# Patient Record
Sex: Female | Born: 1935 | Race: White | Hispanic: No | Smoking: Never smoker
Health system: Southern US, Community
[De-identification: ages and names within clinical notes are randomized; demographics above are authoritative.]

## PROBLEM LIST (undated history)

## (undated) DIAGNOSIS — R55 Syncope and collapse: Secondary | ICD-10-CM

## (undated) DIAGNOSIS — I10 Essential (primary) hypertension: Secondary | ICD-10-CM

## (undated) DIAGNOSIS — J189 Pneumonia, unspecified organism: Secondary | ICD-10-CM

## (undated) DIAGNOSIS — Z79899 Other long term (current) drug therapy: Secondary | ICD-10-CM

## (undated) DIAGNOSIS — J449 Chronic obstructive pulmonary disease, unspecified: Secondary | ICD-10-CM

## (undated) DIAGNOSIS — I4891 Unspecified atrial fibrillation: Secondary | ICD-10-CM

## (undated) DIAGNOSIS — Z9981 Dependence on supplemental oxygen: Secondary | ICD-10-CM

## (undated) DIAGNOSIS — M1990 Unspecified osteoarthritis, unspecified site: Secondary | ICD-10-CM

## (undated) HISTORY — PX: CATARACT EXTRACTION, BILATERAL: SHX1313

## (undated) HISTORY — DX: Unspecified atrial fibrillation: I48.91

## (undated) HISTORY — DX: Essential (primary) hypertension: I10

## (undated) HISTORY — PX: HIP FRACTURE SURGERY: SHX118

## (undated) HISTORY — DX: Pneumonia, unspecified organism: J18.9

## (undated) HISTORY — DX: Syncope and collapse: R55

## (undated) HISTORY — PX: FEMUR FRACTURE SURGERY: SHX633

## (undated) HISTORY — DX: Other long term (current) drug therapy: Z79.899

## (undated) HISTORY — PX: FRACTURE SURGERY: SHX138

## (undated) HISTORY — PX: TONSILLECTOMY: SUR1361

---

## 1975-01-05 ENCOUNTER — Encounter: Payer: Self-pay | Admitting: Cardiology

## 1998-02-24 ENCOUNTER — Encounter: Payer: Self-pay | Admitting: Internal Medicine

## 1998-02-24 ENCOUNTER — Emergency Department (HOSPITAL_COMMUNITY): Admission: EM | Admit: 1998-02-24 | Discharge: 1998-02-24 | Payer: Self-pay | Admitting: Internal Medicine

## 1998-07-12 ENCOUNTER — Other Ambulatory Visit: Admission: RE | Admit: 1998-07-12 | Discharge: 1998-07-12 | Payer: Self-pay | Admitting: Family Medicine

## 1998-09-23 ENCOUNTER — Encounter: Payer: Self-pay | Admitting: Emergency Medicine

## 1998-09-24 ENCOUNTER — Encounter: Payer: Self-pay | Admitting: Orthopedic Surgery

## 1998-09-24 ENCOUNTER — Inpatient Hospital Stay (HOSPITAL_COMMUNITY): Admission: EM | Admit: 1998-09-24 | Discharge: 1998-09-30 | Payer: Self-pay | Admitting: Emergency Medicine

## 1998-09-30 ENCOUNTER — Inpatient Hospital Stay (HOSPITAL_COMMUNITY)
Admission: RE | Admit: 1998-09-30 | Discharge: 1998-10-07 | Payer: Self-pay | Admitting: Physical Medicine & Rehabilitation

## 1998-10-01 ENCOUNTER — Encounter: Payer: Self-pay | Admitting: Physical Medicine & Rehabilitation

## 1999-10-16 ENCOUNTER — Encounter: Payer: Self-pay | Admitting: Family Medicine

## 1999-10-16 ENCOUNTER — Encounter: Admission: RE | Admit: 1999-10-16 | Discharge: 1999-10-16 | Payer: Self-pay | Admitting: Family Medicine

## 2000-11-09 ENCOUNTER — Encounter: Admission: RE | Admit: 2000-11-09 | Discharge: 2000-11-09 | Payer: Self-pay | Admitting: Family Medicine

## 2000-11-09 ENCOUNTER — Encounter: Payer: Self-pay | Admitting: Family Medicine

## 2001-12-14 ENCOUNTER — Encounter: Payer: Self-pay | Admitting: Family Medicine

## 2001-12-14 ENCOUNTER — Encounter: Admission: RE | Admit: 2001-12-14 | Discharge: 2001-12-14 | Payer: Self-pay | Admitting: Family Medicine

## 2002-12-04 ENCOUNTER — Encounter: Payer: Self-pay | Admitting: Family Medicine

## 2002-12-04 ENCOUNTER — Encounter: Admission: RE | Admit: 2002-12-04 | Discharge: 2002-12-04 | Payer: Self-pay | Admitting: Family Medicine

## 2003-01-10 ENCOUNTER — Encounter: Payer: Self-pay | Admitting: Family Medicine

## 2003-01-10 ENCOUNTER — Encounter: Admission: RE | Admit: 2003-01-10 | Discharge: 2003-01-10 | Payer: Self-pay | Admitting: Family Medicine

## 2003-02-12 ENCOUNTER — Encounter: Payer: Self-pay | Admitting: Gastroenterology

## 2003-02-12 ENCOUNTER — Encounter: Admission: RE | Admit: 2003-02-12 | Discharge: 2003-02-12 | Payer: Self-pay | Admitting: Gastroenterology

## 2003-04-06 ENCOUNTER — Encounter (INDEPENDENT_AMBULATORY_CARE_PROVIDER_SITE_OTHER): Payer: Self-pay | Admitting: Cardiology

## 2003-04-06 ENCOUNTER — Inpatient Hospital Stay (HOSPITAL_COMMUNITY): Admission: EM | Admit: 2003-04-06 | Discharge: 2003-04-11 | Payer: Self-pay | Admitting: Emergency Medicine

## 2003-08-30 ENCOUNTER — Ambulatory Visit (HOSPITAL_COMMUNITY): Admission: RE | Admit: 2003-08-30 | Discharge: 2003-08-30 | Payer: Self-pay | Admitting: Internal Medicine

## 2005-02-12 ENCOUNTER — Encounter (INDEPENDENT_AMBULATORY_CARE_PROVIDER_SITE_OTHER): Payer: Self-pay | Admitting: *Deleted

## 2005-02-12 ENCOUNTER — Ambulatory Visit (HOSPITAL_COMMUNITY): Admission: RE | Admit: 2005-02-12 | Discharge: 2005-02-12 | Payer: Self-pay | Admitting: Internal Medicine

## 2007-07-17 HISTORY — PX: KNEE ARTHROCENTESIS: SUR44

## 2007-08-07 ENCOUNTER — Inpatient Hospital Stay (HOSPITAL_COMMUNITY): Admission: EM | Admit: 2007-08-07 | Discharge: 2007-08-15 | Payer: Self-pay | Admitting: Emergency Medicine

## 2007-08-07 ENCOUNTER — Ambulatory Visit: Payer: Self-pay | Admitting: Cardiology

## 2007-08-08 ENCOUNTER — Ambulatory Visit: Payer: Self-pay | Admitting: Vascular Surgery

## 2007-08-08 ENCOUNTER — Encounter (INDEPENDENT_AMBULATORY_CARE_PROVIDER_SITE_OTHER): Payer: Self-pay | Admitting: Internal Medicine

## 2007-08-15 ENCOUNTER — Ambulatory Visit: Payer: Self-pay | Admitting: Physical Medicine & Rehabilitation

## 2007-08-31 ENCOUNTER — Ambulatory Visit: Payer: Self-pay | Admitting: Cardiology

## 2007-09-08 ENCOUNTER — Ambulatory Visit: Payer: Self-pay

## 2007-09-15 ENCOUNTER — Ambulatory Visit: Payer: Self-pay

## 2007-10-12 ENCOUNTER — Ambulatory Visit: Payer: Self-pay | Admitting: Cardiology

## 2008-11-22 ENCOUNTER — Encounter (INDEPENDENT_AMBULATORY_CARE_PROVIDER_SITE_OTHER): Payer: Self-pay | Admitting: *Deleted

## 2009-03-25 DIAGNOSIS — I4891 Unspecified atrial fibrillation: Secondary | ICD-10-CM | POA: Insufficient documentation

## 2009-03-25 DIAGNOSIS — R55 Syncope and collapse: Secondary | ICD-10-CM | POA: Insufficient documentation

## 2009-03-25 DIAGNOSIS — I10 Essential (primary) hypertension: Secondary | ICD-10-CM

## 2009-03-27 ENCOUNTER — Ambulatory Visit: Payer: Self-pay | Admitting: Cardiology

## 2009-03-27 DIAGNOSIS — Q211 Atrial septal defect: Secondary | ICD-10-CM

## 2009-04-01 LAB — CONVERTED CEMR LAB
BUN: 26 mg/dL — ABNORMAL HIGH (ref 6–23)
Basophils Absolute: 0 10*3/uL (ref 0.0–0.1)
Basophils Relative: 0.5 % (ref 0.0–3.0)
CO2: 31 meq/L (ref 19–32)
Calcium: 9.1 mg/dL (ref 8.4–10.5)
Chloride: 105 meq/L (ref 96–112)
Creatinine, Ser: 1.3 mg/dL — ABNORMAL HIGH (ref 0.4–1.2)
Eosinophils Absolute: 0.1 10*3/uL (ref 0.0–0.7)
Eosinophils Relative: 1.5 % (ref 0.0–5.0)
GFR calc non Af Amer: 41.26 mL/min (ref >60)
Glucose, Bld: 91 mg/dL (ref 70–99)
HCT: 39 % (ref 36.0–46.0)
Hemoglobin: 13.1 g/dL (ref 12.0–15.0)
Lymphocytes Relative: 25.7 % (ref 12.0–46.0)
Lymphs Abs: 1.9 10*3/uL (ref 0.7–4.0)
MCHC: 33.6 g/dL (ref 30.0–36.0)
MCV: 97.7 fL (ref 78.0–100.0)
Monocytes Absolute: 0.7 10*3/uL (ref 0.1–1.0)
Monocytes Relative: 9.4 % (ref 3.0–12.0)
Neutro Abs: 4.6 10*3/uL (ref 1.4–7.7)
Neutrophils Relative %: 62.9 % (ref 43.0–77.0)
Platelets: 156 10*3/uL (ref 150.0–400.0)
Potassium: 4.2 meq/L (ref 3.5–5.1)
RBC: 3.99 M/uL (ref 3.87–5.11)
RDW: 14.5 % (ref 11.5–14.6)
Sodium: 143 meq/L (ref 135–145)
WBC: 7.3 10*3/uL (ref 4.5–10.5)

## 2010-05-07 ENCOUNTER — Ambulatory Visit: Payer: Self-pay | Admitting: Cardiology

## 2010-05-07 ENCOUNTER — Encounter: Payer: Self-pay | Admitting: Cardiology

## 2010-06-07 ENCOUNTER — Encounter: Payer: Self-pay | Admitting: Family Medicine

## 2010-06-19 NOTE — Assessment & Plan Note (Signed)
Summary: [facility]   Visit Type:  1 year follow up  CC:  No complaints.  History of Present Illness: Candice Rangel is a pleasant  female whom I initially saw on August 31, 2007.  She had been admitted to [facility] on August 07, 2007 with a syncopal episode.  Of note, a previous echocardiogram in March of 2009 revealed normal LV function, mild mitral and tricuspid regurgitation and moderate biatrial enlargement. There was a large PFO.  We did schedule her to have a Myoview which was performed on September 15, 2007. There was no ischemia or infarction, and her ejection fraction was 78%. She also had a CardioNet monitor.  There was atrial fibrillation noted which was chronic, but there were no significant pauses. I last saw her in Nov 2010. Since then the patient denies any dyspnea on exertion, orthopnea, PND,  palpitations, syncope or chest pain; occasional mild pedal edema. No bleeding.   Current Medications (verified): 1)  Losartan Potassium 50 Mg Tabs (Losartan Potassium) .Marland Kitchen.. 1 Tab By Mouth Once Daily 2)  Diltiazem Hcl Er Beads 240 Mg Xr24h-Cap (Diltiazem Hcl Er Beads) .... Take One Capsule By Mouth Daily 3)  Warfarin Sodium 2.5 Mg Tabs (Warfarin Sodium) .... As Directed 4)  Digoxin 0.125 Mg Tabs (Digoxin) .... Take A Half  Tablet By Mouth Daily 5)  Zyrtec Allergy 10 Mg Tabs (Cetirizine Hcl) .... As Needed 6)  Multivitamins   Tabs (Multiple Vitamin) .Marland Kitchen.. 1 Tab By Mouth Once Daily 7)  Calcium-Vitamin D 500-200 Mg-Unit Tabs (Calcium Carbonate-Vitamin D) .Marland Kitchen.. 1 Tab By Mouth Once Daily  Allergies (verified): 1)  ! Penicillin 2)  ! Keflex  Past History:  Past Medical History: HYPERTENSION (ICD-401.9) ATRIAL FIBRILLATION (ICD-427.31) SYNCOPE (ICD-780.2) COUMADIN THERAPY (ICD-V58.61) History of pneumonia/bronchiectasis.   Past Surgical History: Left knee aspiration and injection.   tonsillectomy,  C-section,   surgical procedure on her leg following a trauma.    Cataract surgery bilaterally.   Right hip fracture status post repair.   Social History: Reviewed history from 03/27/2009 and no changes required.  The patient is married.    She  denies any history use of alcohol or smoking.   She is the coowner of a restaurant.   Review of Systems       no fevers or chills, productive cough, hemoptysis, dysphasia, odynophagia, melena, hematochezia, dysuria, hematuria, rash, seizure activity, orthopnea, PND, pedal edema, claudication. Remaining systems are negative.   Vital Signs:  Patient profile:   77 year old female Height:      63 inches Weight:      112.75 pounds BMI:     20.04 Pulse rate:   76 / minute Pulse rhythm:   irregular Resp:     18 per minute BP sitting:   152 / 79  (right arm) Cuff size:   regular  Vitals Entered By: Candice Rangel (May 07, 2010 12:28 PM)  Physical Exam  General:  Well-developed well-nourished in no acute distress.  Skin is warm and dry.  HEENT is normal.  Neck is supple. No thyromegaly.  Chest is clear to auscultation with normal expansion.  Cardiovascular exam is irregular Abdominal exam nontender or distended. No masses palpated. Extremities show 1+ ankle edema. neuro grossly intact    EKG  Procedure date:  05/07/2010  Findings:      atrial fibrillation with PVCs vs nonconducted beats. Cannot rule out prior septal infarct. Nonspecific ST changes.  Impression & Recommendations:  Problem # 1:  ATRIAL  FIBRILLATION (ICD-427.31) Pt remains asymptomatic. Continue Cardizem and digoxin for rate control. Continue Coumadin. Her updated medication list for this problem includes:    Warfarin Sodium 2.5 Mg Tabs (Warfarin sodium) .Marland Kitchen... As directed    Digoxin 0.125 Mg Tabs (Digoxin) .Marland Kitchen... Take a half  tablet by mouth daily  Her updated medication list for this problem includes:    Warfarin Sodium 2.5 Mg Tabs (Warfarin sodium) .Marland Kitchen... As directed    Digoxin 0.125 Mg Tabs (Digoxin) .Marland Kitchen... Take a half   tablet by mouth daily  Orders: EKG w/ Interpretation (93000)  Problem # 2:  HYPERTENSION (ICD-401.9) Blood pressure mildly elevated. I am hesitant to increase her medications as she has some dizziness with standing. Check potassium and renal function. Her updated medication list for this problem includes:    Losartan Potassium 50 Mg Tabs (Losartan potassium) .Marland Kitchen... 1 tab by mouth once daily    Diltiazem Hcl Er Beads 240 Mg Xr24h-cap (Diltiazem hcl er beads) .Marland Kitchen... Take one capsule by mouth daily  Problem # 3:  PATENT FORAMEN OVALE (ICD-745.5) Conservative measures giving age.  Problem # 4:  COUMADIN THERAPY (ICD-V58.61) Managed by primary care. Goal INR 2-3. he is  Patient Instructions: 1)  Your physician recommends that you schedule a follow-up appointment ; YEAR WITH DR Jens Som 2)  Your physician recommends that you continue on your current medications as directed. Please refer to the Current Medication list given to you today. 3)  Your physician recommends that you return for lab work [id number] BMET CBC 401.1 V58.69

## 2010-09-30 NOTE — H&P (Signed)
Candice Rangel, GIBEAULT NO.:  [id number]   MEDICAL RECORD NO.:  [id number]          PATIENT TYPE:  INP   LOCATION:  1826                         FACILITY:  [facility]   PHYSICIAN:  Hillery Aldo, M.D.   DATE OF BIRTH:  [birth year]   DATE OF ADMISSION:  18-May-202009  DATE OF DISCHARGE:                              HISTORY & PHYSICAL   PRIMARY CARE PHYSICIAN:  Dr. Andrey Campanile with [facility].   CHIEF COMPLAINT:  Fall secondary to syncope, seizure.   HISTORY OF PRESENT ILLNESS:  The patient is an 77 year old remarkably  active female who had a syncopal event while working today.  She does  not recall the events prior to her syncopal episode and her first memory  after the event was waking up in the hospital.  Currently complaining of  a mild headache at the site where she struck her head, sustaining a  minor head injury.  She denies any chest pain but has some right-sided  shoulder pain.  She denies shortness of breath but has had a cough  productive of yellow sputum.  No fever or chills but she was recently  diagnosed with pneumonia approximately 2 days ago and put on Levaquin.  She has a history of bronchiectasis and recurrent pneumonias.  The  syncopal event was witnessed by her daughter who works with her in a  restaurant.  The patient's daughter states she heard the patient fall to  the floor and noticed seizure activity for approximately 5 minutes.  She  immediately called 9-1-1 and the patient slowly regained consciousness.  She initially opened her eyes only and was nonverbal.  At this point,  the patient is alert and somewhat oriented and appropriate in  conversation.  She is being admitted for further evaluation and workup.   PAST MEDICAL HISTORY:  1. Coronary artery disease.  2. Hypertension.  3. History of recurrent pneumonia.  4. Chronic atrial fibrillation on Coumadin therapy.  5. Allergic rhinitis.  6. Bronchiectasis with chronic interstitial  lung disease.  7. Mild multinodular goiter by CT scanning.   PAST SURGICAL HISTORY:  1. Cataract surgery bilaterally.  2. Right hip fracture status post repair.  3. Tonsillectomy.  4. Cesarean section.   FAMILY HISTORY:  The patient's mother died at 25 from acute MI.  She  also had hypertension.  The patient's father died at 53 from  complications of influenza.  She has three sisters and two brothers.  All of her siblings are deceased.  One sister died from complications of  Alzheimer's disease, one died of ovarian cancer, one died of an MI, and  one's cause of death was uncertain.  One brother died of Doreatha Martin  disease and another died of uncertain causes.   SOCIAL HISTORY:  The patient is married x66 years.  She has a remote  history of tobacco but quit over 50 years ago.  She denies alcohol use.  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   ALLERGIES:  1. PENICILLIN.  2. SEPTRA.  MEDICATIONS:  1. Levaquin 750 mg daily (initiated on August 05, 2007).  2. Cyclobenzaprine 10 mg one-half to one tablet b.i.d. (has not taken      in some time).  3. Coumadin, dosage uncertain.  4. Blood pressure medication, uncertain name or type.  5. Ultram 50 mg q.8h. p.r.n.  6. Zyrtec 10 mg daily.  7. Benzonatate 100 mg one to two tablets q.8h. p.r.n.   REVIEW OF SYSTEMS:  The patient reports a gradual decrease in her  appetite over time and an approximate 10-pound weight loss over many  months to years.  She ate a heavy meal last night and did get up several  times during night complaining of loose stools x3.  She denies any  melena or hematochezia.  She denies nausea or vomiting.  She did have  some dyspepsia yesterday.  She notes that on her most recent visit to  the doctor she was complaining of an irregular and racing heart.   PHYSICAL EXAMINATION:  Temperature 96.3, pulse 76, respirations 16,  blood pressure 131/77,  O2 saturation 94% on room air.  GENERAL:  This is a well-developed, well-nourished female who appears  younger than her stated age.  HEENT:  Normocephalic, atraumatic except for a periorbital hematoma  above the left eye that was repaired skin glue.  PERRL.  EOMI.  Oropharynx is clear.  No tongue deviation.  She does have a right tongue  abrasion.  NECK:  Supple.  No thyromegaly, no lymphadenopathy, no jugular venous  distention.  CHEST:  Diminished breath sounds with occasional crackles bilaterally.  HEART:  Heart sounds are irregularly irregular.  Variable rate.  ABDOMEN:  Soft, nontender, nondistended with normoactive bowel sounds.  EXTREMITIES:  No clubbing, edema, or cyanosis.  No calf tenderness.  SKIN:  Warm and dry.  No rashes.  NEUROLOGIC:  The patient is alert and oriented to self, place, month,  and year.  She is 1 day off on the day of the week and 1 day off with  regard to the specific date.  Cranial nerves II-XII are grossly intact.  She moves all extremities x4 with equal strength.   DATA REVIEW:  CT scan of the head and cervical spine are negative for  acute changes.   Laboratory data:  Sodium is 140, potassium 4.2, chloride 108, bicarb  23.2, BUN 22, glucose 134.  Point-of-care cardiac markers negative x1.  Urinalysis is positive for leukocytes, negative for nitrites.  The  microscopy shows 0-2 white cells, 3-6 red cells, rare bacteria, and 100  mg/dL of protein.  There were many squamous cells present in the  specimen indicating contamination.   ASSESSMENT AND PLAN:  1. Syncope with seizure:  I suspect the patient has tachybrady      syndrome and this is largely a cardiac-induced event.  We will      admit her and monitor on telemetry.  Will obtain a cardiology      consult in the morning.  We will cycle cardiac enzymes q.8h. x3 to      rule out acute MI.  Check a two-dimensional echocardiogram.  For      completeness, would also check an MRI/MRA and carotid  Dopplers to      rule out cerebrovascular disease.  She is already on Coumadin and I      will continue this per pharmacy dosing.  Will put her on seizure      precautions.  2. History of hypertension:  Clarify the  patient's outpatient regimen.      She currently states that her blood pressure medicine is filled by      [employer] through a [employer].  Her daughter will call me      with her exact medication regimen when she gets home.      Nevertheless, the patient is not currently hypertensive.  3. Atrial fibrillation:  The patient has a variable rate.  She did      have a 12-lead EKG done in the emergency department which does show      atrial fibrillation and Q waves in the septal leads.  There are no      acute ST or T-wave changes appreciated.  Ventricular rate is 78      beats per minute but again, it is highly variable.  Unclear if the      patient is currently on digoxin so I will check a digoxin level.      Will check the patient's PT and INR to ensure that she is      therapeutic on her Coumadin.  4. Pneumonia:  Check the patient's chest x-ray and continue her      Levaquin.  5. Allergic rhinitis:  Continue Zyrtec.  6. Prophylaxis:  The patient is on Coumadin which should prevent DVT.      We will add Protonix for GI prophylaxis.      Hillery Aldo, M.D.  Electronically Signed     CR/MEDQ  D:  02-Mar-202009  T:  02-Mar-202009  Job:  [id number]   cc:   Gloriajean Dell. Andrey Campanile, M.D.

## 2010-09-30 NOTE — Discharge Summary (Signed)
NAMEMADY, OUBRE NO.:  [id number]   MEDICAL RECORD NO.:  [id number]          PATIENT TYPE:  INP   LOCATION:  6703                         FACILITY:  [facility]   PHYSICIAN:  Hettie Holstein, D.O.    DATE OF BIRTH:  07-26-1920   DATE OF ADMISSION:  Feb 01, 202009  DATE OF DISCHARGE:                               DISCHARGE SUMMARY   PRIMARY CARE PHYSICIAN:  Gloriajean Dell. Andrey Campanile, M.D., [facility].   FINAL DIAGNOSES:  1. Syncope.  After a thorough inpatient evaluation, this was felt      predominantly to be related to quinolone class of antibiotics as      much of her constellation of symptoms resolved after      discontinuation of these medications.  These symptoms included      multiple joint pains and swellings including her left knee, right      wrist, and right shoulder as well as intermittent bouts of      confusion and her presenting complaint of syncope which had no      reoccurrence during her hospital course.  Status post inpatient      consultation with neurology who did not recommend antiseizure      medications, but did feel that she should not be cleared to drive      for 6 months being seizure free or until followup and clearance      from neurology.  2. Chronic atrial fibrillation, on chronic Coumadin therapy.  3. Incidental finding of a patent foramen ovale on 2-D echocardiogram      performed on August 08, 2007.  For full details, please refer to the      echo report below.  4. Pneumonia with a history of bronchiectasis, status post completed      treatment course with 7 days of quinolone antibiotics.  5. Left knee effusion thought to be related to pseudogout, status post      joint aspiration with negative crystal.  Exam on August 13, 2006,      status post orthopedic consultation by Dr. Dion Saucier.   MEDICATIONS ON DISCHARGE:  These will be completed at actual time of  discharge; however, at the present time, she is on  1. Colchicine 0.6 mg  p.o. b.i.d.  2. Digoxin 0.125 mg daily.  3. Tiazac 240 mg daily.  4. Mucinex 600 mg p.o. b.i.d.  This is anticipated to continue for      another week.  5. Motrin provided 300 mg p.o. b.i.d.  This can be discontinued after      5 days.  6. Zyrtec 10 mg daily.  7. Her Cozaar is being held at this time due to marginal blood      pressure.  8. Senokot twice daily with parameters to hold if she develop      diarrhea.  9. Coumadin per pharmacy protocol.  At present, her INR is 2.4.      Again, please note further addendum to this medication list will be      conducted at actual time of patient transfer or discharge.  STUDIES PERFORMED:  1. She has had multiple imaging studies.  Her initial CT of her C-      spine was performed in the emergency department due a fall which      revealed normal alignment with degenerative disk disease primarily      at C4-C5, C5-C6, and C6-C7.  No acute fractures or degenerative      changes in C1 and C2 articulation.  2. Her initial CT of the head revealed only atrophy and small vessel      disease as well.  Her initial MRI, MRA to rule out stroke revealed      no acute intracranial abnormality.  There was some slightly      increased signal within the right hippocampus.  There were some      mention that this could possibly be related to the recent seizure      activities, no definite seizures with focus were identified,      advanced periventricular and subcortical white matter changes,      these were nonspecific, likely reflects sequelae of chronic      microvascular ischemia.  3. Prominent soft tissue posterior to the dens, with possible erosion,      this may represent a degree of inflammatory arthritis, perhaps      rheumatoid.  4. Left scalp laceration was noted without underlying fracture.  5. Her chest x-ray revealed only trace bilateral pleural effusions and      a pulmonary parenchymal findings of MAC,confluent opacity in the      left mid  lung zone that revealed some questionable findings of a      nodule A follow up CT was performed on August 11, 2007, that      revealed no evidence of pulmonary emboli or thoracic aortic      dissection.  There was left basilar atelectasis/air space disease      and chronic peribronchial thickening and a benign right lower lung      nodule as reported by Dr. Si Gaul.  6. Other imaging studies included a CT of the head and a repeat MRI of      the brain as she had some transient confusion during her stay and      the concern for with a stroke were considered.  These both were      negative.  7. Her bilateral carotid duplex on August 08, 2007, revealed no      evidence of significant internal carotid artery stenosis.  8. Her 2-D echocardiogram revealed overall normal LV systolic      function, EF was estimated at 55%.  There was a mild mitral      regurgitation, and there was some moderate dilation of the left      atrium and the right atrium with a fairly large patent foramen      ovale.  Additional radiographs including 2 view of her knee      revealed no evidence of fracture or acute abnormity.  There was      chondrocalcinosis suggestive of calcium phosphate disease with      narrowing of the medial compartment, articular cartilage, and      osteopenia.  9. TSH was 2.24.  Cardiac markers were negative.  Digoxin level was      therapeutic.  D-dimer was mildly elevated at 0.96.   HISTORY OF PRESENT ILLNESS:  For full details, please refer to the H and  P as dictated by Dr.  Rama; however, briefly, Mrs. 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  Apparently, she had recently been treated for a pneumonia with  initiation of a quinolone antibiotic through her primary care physician.  She has a previous history of bronchiectasis and recurrent pneumonias.  In any event,  daughter states that she heard the patient fall to the  floor and noticed a seizure activity of approximately 5 minutes.  She  immediately called 911, and the patient slowly regained consciousness.  She then slowly opened her eyes only and was nonverbal.  At that point,  she was alert, somewhat oriented and appropriate to conversation.  She  is generally quite vibrant and active according her to daughter,  Synethia, as she works about 3 days a weeks in her bagel shop, called  [employer].  In any event, she was admitted for further workup.  She was see in consultation by Dr. Dietrich Pates.  She has a known history of  chronic atrial fibrillation, on chronic Coumadin therapy.  In addition,  she was seen by neurology, Dr. Porfirio Mylar Dohmeier.   HOSPITAL COURSE:  Her hospital course was that of further workup.  She  continued with the quinolone antibiotics and suffered in retrospect  symptoms that resolved after discontinuation of this medication.  These  included joint pains and effusions including left knee, right shoulder,  and right wrist as well as some intermittent periods of confusion.  Due  to the progressive pain in her left knee, orthopedic surgeon was  consulted and services were provided by Dr. Dion Saucier, and Mrs. Herrod  experienced prompt relief of her pain with initiation of anti-  inflammatories and a joint aspiration.  No crystals were noted.  Additionally, it was noted that she was placed on  colchicine.  In the event, she is quite deconditioned, and I suspect  that she is definitely intent on returning home and returning to work.  Certainly, I hope that she can achieve adequate physical therapy and  occupational therapy such that she may return home.  I have requested  assistance of PT, OT as well as inpatient rehabilitation.      Hettie Holstein, D.O.  Electronically Signed     ESS/MEDQ  D:  08/14/2007  T:  08/15/2007  Job:  [id number]   cc:   Gloriajean Dell. Andrey Campanile, M.D.

## 2010-09-30 NOTE — Assessment & Plan Note (Signed)
[facility]                            CARDIOLOGY OFFICE NOTE   NAME:VALLEYCortina, Vultaggio                       MRN:          [id number]  DATE:10/12/2007                            DOB:          [birth year]    Candice Rangel is a pleasant 77 year old female whom I initially saw on  August 31, 2007.  She had been admitted to [facility] on August 07, 2007 with a syncopal episode.  Please refer to my note for details.  Of note, a previous echocardiogram had shown normal LV function.  We did  schedule her to have a Myoview which was performed on September 15, 2007.  There was no ischemia or infarction, and her ejection fraction was 78%.  She also had a CardioNet monitor.  There was atrial fibrillation noted,  but there were no significant pauses.  Since I last saw her, she denies  any dyspnea, chest pain, palpitations, and there has been no recurrent  syncope.  She has mild pedal edema which is unchanged.   MEDICATIONS:  1. Digoxin 0.125 mg p.o. daily.  2. Cozaar 50 mg p.o. daily.  3. Coumadin as directed.  4. Cardizem 240 mg p.o. daily.  5. Zyrtec.   PHYSICAL EXAMINATION:  VITAL SIGNS:  Blood pressure of 134/60 and pulse  of 67.  She weighs 121 pounds.  HEENT:  Normal.  NECK:  Supple.  CHEST:  Clear.  CARDIOVASCULAR:  Irregular rhythm.  There is a 2/6 systolic murmur at  the apex.  ABDOMEN:  No tenderness.  EXTREMITIES:  Trace to 1+ edema.   DIAGNOSES:  1. Recent syncopal episode - we have still not come up with an      etiology for this particular event.  Her LV function is normal, and      there was no ischemia on her Myoview.  Her CardioNet showed no      significant pauses.  We will therefore not pursue further      evaluation at this point.  If she has recurrences in the future,      then she may require an implantable loop monitor.  I have      instructed her not to drive until November 07, 2007, which would be 3      months from her previous  event.  2. Permanent atrial fibrillation - she will continue on her digoxin      and Cardizem for rate control, as well as Coumadin with a goal INR      of 2-3.  3. Coumadin therapy - this is being monitored by Dr. Andrey Campanile.  4. History of pneumonia/bronchiectasis.  5. History of PFO.  6. Hypertension - her blood pressure is adequately controlled on her      present medications.   We will see her back in 6 months.     Madolyn Frieze Jens Som, MD, [facility]  Electronically Signed    BSC/MedQ  DD: 10/12/2007  DT: 10/12/2007  Job #: [id number]   cc:   Gloriajean Dell. Andrey Campanile, M.D.

## 2010-09-30 NOTE — Consult Note (Signed)
[name], Candice NO.:  [id number]   MEDICAL RECORD NO.:  [id number]          PATIENT TYPE:  INP   LOCATION:  6703                         FACILITY:  [facility]   PHYSICIAN:  Gerrit Friends. Dietrich Pates, MD, FACCDATE OF BIRTH:  1921-03-18   DATE OF CONSULTATION:  08/08/2007  DATE OF DISCHARGE:                                 CONSULTATION   REFERRING PHYSICIAN:  Hillery Aldo, M.D.   PRIMARY CARE PHYSICIAN:  Dr. Andrey Campanile, [facility].   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  Unfortunately, the patient is amnesia for the event,  and no family members are present.  Information is obtained from prior  records that were reviewed.  Candice Rangel describes a sense of fatigue  this prior to the event, but no lightheadedness nor sense of impending  loss of consciousness.  There was no nausea nor emesis.  She denies a  headache.  The next thing she remembers was becoming aware of her  surroundings in the hospital.  Her daughter described seizure activity  immediately after her fall.  She suffered forehead trauma and a  laceration, but a CT scan of the head was negative.  She was described  as being postictal when EMS arrived.   Candice Rangel cardiac history is notable only for atrial fibrillation.  She was admitted to hospital in 2004 with a paroxysmal episode.  She  subsequently developed an continuous AF and has been anticoagulated for  the past 2 years.  An echocardiogram was normal when she first  presented.  She has not been evaluated by a cardiologist for her  arrhythmia.   The patient has no prior episodes of loss of consciousness.  She has not  been troubled by dizziness.  She was evaluated by a pulmonologist at  [facility] some years ago, but apparently no significant abnormalities were  found.   She does have a history of hypertension.  She  does not have  hyperlipidemia or diabetes.  She has not been a cigarette smoker.   Past medical history is otherwise notable for a remote tonsillectomy, C-  section and surgical procedure on her leg following trauma.  She was  once treated for pneumonia.  She has allergic rhinitis.   ALLERGIES:  To KEFLEX, PENICILLIN and SEPTRA are noted.   MEDICATIONS ON ADMISSION:  1. Digoxin 0.125 mg daily.  2. Diltiazem 240 mg daily.  3. Claritin 10 mg daily.  4. Avelox 400 mg daily.  5. Protonix 40 mg daily.  6. Warfarin.   SOCIAL HISTORY:  Married and lives in [place].  She denies excessive  use of alcohol.   FAMILY HISTORY:  Mother died at age 66 with myocardial infarction.  Father died at age 16 due to respiratory infection.  She has seven  siblings, none of whom have known vascular disease.   REVIEW OF SYSTEMS:  Notable for occasional headaches, chronic class II  dyspnea on exertion, mild lower extremity edema in the daytime that  resolves overnight.  She has a remote history of palpitations, but none  recently.  She has GERD symptoms.  All other systems reviewed and are  negative.   PHYSICAL EXAMINATION:  GENERAL:  A pleasant woman in no acute distress.  HEENT:  Ecchymosis around left eye; bandage over left forehead  laceration; sunken eyes; EOMs full; normal lids and conjunctivae; normal  oral mucosa.  SKIN:  Dark complexion; no significant lesions.  ENDOCRINE:  No thyromegaly.  HEMATOPOIETIC:  No adenopathy.  NECK:  No jugular venous distention; normal carotid upstrokes without  bruits.  LUNGS:  Clear.  CARDIAC:  Normal first and second heart sounds; modest basilar systolic  ejection murmur.  ABDOMEN:  Soft and nontender; normal bowel sounds; no masses; no  organomegaly.  EXTREMITIES:  No edema; distal pulses intact.  NEUROLOGIC:  Symmetric strength and tone; normal cranial nerves.   EKG:  Atrial fibrillation with controlled ventricular response; delayed  R-wave  progression, cannot exclude prior septal myocardial infarction;  nonspecific T-wave abnormality.   Other laboratory notable for normal TSH, normal cardiac markers, normal  chemistry profile and normal CBC.  Lipid profile is excellent on no  treatment.   Her echocardiogram showed very mild aortic stenosis, normal left  ventricular size and function, moderate biatrial enlargement, and a  patent foramen ovale.   IMPRESSION:  [name] presents with a fall, head trauma, amnesia and  no prodrome.  Although minor seizure activity can occur with decreased  cerebral perfusion related to syncope, this episode sounds as if it was  a full grand mal seizure.  A prolonged period of antegrade amnesia is  most consistent with seizure as well.  It is possible that she suffered  a fall or syncopal spell, struck her head and developed a concussion and  amnesia on that basis, but that seems to be less likely.  To complete  her cardiopulmonary workup, I would obtain a D-dimer level and entertain  a diagnosis of pulmonary embolism if substantially elevated.  I would  recommend  neurologic consultation, either on an inpatient or outpatient  basis, since the leading diagnosis is seizure.  Orthostatic vital signs  have been requested.  We do not require any further testing in hospital,  but will provide this nice woman with an event recorder in follow her on  an outpatient basis.   We greatly appreciate the request for consultation.      Gerrit Friends. Dietrich Pates, MD, [facility]  Electronically Signed     RMR/MEDQ  D:  08/08/2007  T:  08/08/2007  Job:  [id number]

## 2010-09-30 NOTE — Assessment & Plan Note (Signed)
[facility]                            CARDIOLOGY OFFICE NOTE   NAME:VALLEYSussan, Meter                       MRN:          [id number]  DATE:08/31/2007                            DOB:          1921/01/30    The patient is an 77 year old female who was recently admitted to [facility] after a syncopal episode.  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  She stood to walk across the room and suddenly had a frank  syncopal episode, hitting her head.  She remembers no preceding events.  There was no chest pain, shortness breath, palpitations or nausea or  vomiting.  She apparently did lose control of her bladder.  There was  also seizure activity.  She was unconscious for some time, but she may  have knocked herself out, falling hitting her head.  During the  admission, she had an extensive workup.  This included an  echocardiogram.  Her ejection fraction was 55%.  There was mild mitral  regurgitation.  There was biatrial enlargement and a patent foramen  ovale was noted.  Also of note, she had a CT that revealed only atrophy  and small vessel disease.  An MRI/MRA revealed no acute intracranial  abnormality.  She also had bilateral carotid duplex that showed no  significant stenosis.  She was seen by Dr. Dietrich Pates in consultation.  I  have none of the remaining records available concerning his initial  consultation.  Of note, her cardiac markers were negative.  She also  apparently had an EEG that showed intermittent slowing in the bifrontal  areas.  This apparently was felt to be nonspecific.  The report does  state that she would be considered to have a lowered seizure threshold,  although no clear seizures were recorded.  Dr. Vickey Huger did see the  patient during the admission and felt that it was most likely cardiac,  based on her initial consultation.  Since discharge, she has done well.  She denies any dyspnea, chest pain, palpitations or syncope,  and there  was no pedal edema.   MEDICATIONS:  1. Digoxin 0.25 mg p.o. daily.  2. Cozaar 50 mg p.o. daily.  3. Coumadin as directed and followed by Dr. Andrey Campanile.  4. Cardizem 240 mg p.o. daily.  5. Zyrtec.   PHYSICAL EXAMINATION:  VITAL SIGNS:  Her physical exam today shows a  blood pressure of 129/76 and her pulse is 64.  She weighs 118 pounds.  HEENT:  Significant for mild ecchymosis under the left eye.  NECK:  Supple with no bruits.  CHEST:  Clear.  CARDIOVASCULAR:  Irregular rhythm.  There is a 2/6 systolic murmur at  the left sternal border.  S2 was not diminished.  ABDOMEN:  No tenderness.  EXTREMITIES:  No edema.   ELECTROCARDIOGRAM:  Her electrocardiogram shows atrial fibrillation at a  rate of 62.  There were occasional PVCs or aberrantly conducted beats  and nonspecific ST changes were noted.   DIAGNOSES:  1. Recent syncopal episode - the etiology of this remains unclear.      Her left  ventricular function was normal on her echocardiogram.  I      will schedule her to have a Myoview to exclude ischemia.  Will      schedule her to have a cardiac monitor.  I will see her back in 6      weeks to review that information.  2. Permanent atrial fibrillation - she will continue on her digoxin      and Cardizem for rate control, as well as her Coumadin with a goal      INR of 2-3.  3. Coumadin therapy - this is being monitored by Dr. Andrey Campanile.  4. History of pneumonia and bronchiectasis.  5. History of patent foramen ovale.  6. Hypertension.   I will see her back in 6 weeks as described above.  She has been  instructed not to drive.     Madolyn Frieze Jens Som, MD, [facility]  Electronically Signed    BSC/MedQ  DD: 08/31/2007  DT: 08/31/2007  Job #: [id number]   cc:   Gloriajean Dell. Andrey Campanile, M.D.

## 2010-09-30 NOTE — Consult Note (Signed)
Candice, Rangel NO.:  [id number]   MEDICAL RECORD NO.:  [id number]          PATIENT TYPE:  INP   LOCATION:  6703                         FACILITY:  [facility]   PHYSICIAN:  Melvyn Novas, M.D.  DATE OF BIRTH:  05/21/1920   DATE OF CONSULTATION:  08/09/2007  DATE OF DISCHARGE:                                 CONSULTATION   [name] is an 77 year old younger than her numeric age  appearing Caucasian right-handed female who presented to the hospital on  the 22nd of March 2009 after collapsing at work.  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  She has amnesia for the event, but  her daughter described finding her mother in what she believes was a 5  minute seizure.  She had no lightheadedness.  She was slightly confused  afterwards, and she states that she had a sense of fatigue prior to the  event.  She denies any headaches and except for the amnesia and the  abrasion on her left forehead has no other neurologically important  findings at this time.  She was becoming aware of her surroundings once  she reached the hospital by EMS.  She had a CT scan of the head which  was negative for acute stroke or bleed.  Candice Rangel  has a history of  atrial fibrillation.  Also the ER report speaks of a normal sinus  rhythm.  She was in atrial fibrillation once she reached the telemetry  floor.  She has not been evaluated by cardiology prior to her visit  here, and yesterday had a consult with Dr. Granjeno Bing.  The patient  has no prior episodes of loss of consciousness, of stroke or of coronary  artery disease or heart attacks.  She has a history of hypertension, but  is unaware of hyperlipidemia.  She does not have diabetes.  She is not a  previous smoker or drinker.   PAST MEDICAL HISTORY:  Positive for tonsillectomy, C-section, surgical  procedure on her leg following a trauma.  She has once been  intrahospitally treated for pneumonia.   She has a history of allergic  rhinitis.   ALLERGIES:  Allergies to KEFLEX, PENICILLIN, and SEPTRA are noted.   MEDICATIONS:  The patient was on digoxin 0.125 mg daily, diltiazem 240  mg daily, Claritin 10 mg daily, Avelox 100 mg daily and this was a  treatment for pneumonia, Protonix 40 mg a day, and warfarin.   SOCIAL HISTORY:  The patient is married.  Lives in __________.  She  denies any history use of alcohol or smoking.  Again she is not fully  retired.  She is the coowner of a restaurant.   FAMILY HISTORY:  She states her mother died at 34 with myocardial  infarction.  Dad died at 3 due to a respiratory infection - pneumonia.  She has seven siblings.  None of them has a cardiovascular,  cerebrovascular disease history.   REVIEW OF SYSTEMS:  Notable for occasional headaches.  Chronic dyspnea  on exertion, paroxysmal atrial fibrillation, and palpitation.  All other  systems were reviewed and negative.   PHYSICAL EXAMINATION:  GENERAL:  Pleasant woman in no acute distress,  alert and oriented.  Speech is intact and clear, fluent.  The patient  can follow all commands.  She has ecchymoses around the left eye and a  bandage over the left forehead laceration.  SKIN:  She has a slightly olive complexion, no significant lesions.  NECK:  No goiter, no adenopathy, no jugular venous distention.  EXTREMITIES:  No peripheral clubbing, cyanosis or edema.  Good capillary  refill in toes and fingers.  No paraspinal tenderness or abdominal  soreness.  All pulses are palpable.  NEUROLOGIC:  Alert and oriented x3.  Clear speech, no dysarthria.  No  orthopnea.  No facial asymmetry.  Tongue and uvula midline.  Neck is  supple.  Pupils were equal to light and accommodation.  She has no  visual field deficits.  Motor examination shows equal grip strength 4/5  bilaterally.  Elbow flexion and extension are equal bilaterally, equal  dorsiflexion and extension at the ankle level, equal knee  extension  strength.  Triple joint reflex to plantar stimulation bilateral 1+  patella and upper reflexes are also symmetric.  Sensory is intact to  temperature, pinprick, and touch bilaterally.  There is no extinction on  simultaneous stimulation.  Coordination shows finger-to-nose test no  ataxia, tremor or dysmetria.   ASSESSMENT:  There is no focal neurologic deficit present at this time.  I believe this was a convulsive syncope preceded by a cardiac  arrhythmia.  However, the situation as described would be unusual.  __________ finding and would not be cause or contributor to this event  unless the patient would have suffered a cerebrovascular accident which  was ruled out by MRI.   PLAN:  For the patient to obtain an EEG today.  An MRI has been  reviewed.  Gait and balance stabilization per PT should be initiated,  and I feel that Coumadin is justified and that the patient is not at a  higher fall risk if PT and rehabilitation sign off on her.      Melvyn Novas, M.D.  Electronically Signed     CD/MEDQ  D:  08/09/2007  T:  08/09/2007  Job:  [id number]   cc:   Hillery Aldo, M.D.  Gerrit Friends. Dietrich Pates, MD, [facility]

## 2010-09-30 NOTE — Consult Note (Signed)
NAMESTEFANIA, Candice Rangel NO.:  [id number]   MEDICAL RECORD NO.:  [id number]          PATIENT TYPE:  INP   LOCATION:  6703                         FACILITY:  [facility]   PHYSICIAN:  Eulas Post, MD    DATE OF BIRTH:  Feb 11, 1921   DATE OF CONSULTATION:  DATE OF DISCHARGE:                                 CONSULTATION   REQUESTING PHYSICIAN:  The [facility] medical team.   REASON FOR CONSULTATION:  Evaluate left knee pain.   CHIEF COMPLAINT:  Left knee pain.   HISTORY:  Candice Rangel is an 77 year old woman who was admitted after  a fall with a syncopal event and a mild head injury who complains of 3  days of left knee pain.  She has also recently been treated for  pneumonia with fluoroquinolone.  The daughter says that the last time  that she was treated for pneumonia with fluoroquinolone she developed a  similar-type episode, however, she had pain in her wrist.  This got  better with anti-inflammatory medications and a wrist brace and time.   She says that the left knee pain began about 3 days ago when all of a  sudden she began having pain with weightbearing and with motion.  She  denies any history of trauma.  She did not have a fall onto that knee.  She rates the pain as severe.   PAST MEDICAL HISTORY:  Significant for atrial fibrillation and pneumonia  and she is currently on Coumadin.   FAMILY HISTORY:  Positive for gout.  Her mother had gout and her mother  died of a heart attack at age 15.   SOCIAL HISTORY:  She denies any tobacco use.   REVIEW OF SYSTEMS:  Weight loss is positive for a total of 10 pounds  weight loss over the past 2 years.  She denies any vision changes.  She  reports positive hearing changes with loss of hearing.  Her  cardiovascular review of systems is as above with her atrial  fibrillation.  Respiratory is negative.  She denies any bowel or bladder  problems and denies musculoskeletal complaints except for those as  above.  She  denies any skin rashes or new changes.  She does report easy  bruisability while on the Coumadin.  Neurologic changes:  She has had  recent confusion as well as falls.  Psychiatric review of systems is  also positive for some confusion.  Endocrine is negative and she denies  any recent immune problems except for the pneumonia.   PHYSICAL EXAMINATION:  On exam today she is lying in bed and is in mild  distress, more agitated than actually distressed.  Her temperature is 98.7 and her other vital signs were stable.  NECK:  She has a midline trachea and full range of motion with no  radiculopathy.  LYMPHATIC EXAM:  She has no abnormalities with no cervical or axillary  lymphadenopathy.  On her head she has an abrasion and has bilateral  ecchymoses around her eyes.  CARDIOVASCULAR EXAM:  She has an irregular heart rate and she has  minimal pedal  edema, although her left leg does have some diffuse  swelling at the ankle as well as the knee.  RESPIRATORY EXAM:  She has no increase in respiratory effort.  ABDOMEN:  Soft, nontender, nondistended with no hepatosplenomegaly or  masses.  PSYCHIATRIC EXAM:  She has some mild confusion and otherwise is slightly  agitated.  NEUROLOGIC EXAM:  Her sensation is intact subjectively distally and her  tendon reflexes are symmetric.  MUSCULOSKELETAL EXAM:  Her gait cannot be assessed.  Her digits and  nails are normal.  Her right lower extremity has full range of motion at  the knees, 5/5 strength.  No evidence for instability, no pain to  palpation.  The left knee has positive effusion and I can range the knee  from 0-30 degrees with minimal pain.  There is no cellulitis or evidence  for septic joint.  The knee does not feel unstable although the exam is  limited by pain.  Also, her strength is limited by pain.  She has pain  to palpation diffusely throughout the knee.   LABORATORY DATA:  She has x-rays that demonstrate evidence for calcium   pyrophosphate disease with calcification of the menisci.  These also  show evidence for degenerative joint changes which are moderate.  I have  also reviewed the radiologist's interpretation.  She has an INR of 4  that was dated from today.  She has a white count of 8 that was dated  from August 08, 2007.   IMPRESSION:  1. Left knee pain consistent with pseudogout.  2. Elevated INR, being treated with Coumadin for atrial fibrillation.  3. Recent pneumonia.  4. Mild confusion status post fall.   PLAN:  Medical management should be successful at managing Mrs. Vora's  knee pain.  We may consider any anti-inflammatories that she can take  given her atrial fibrillation and anticoagulation status.  I would also  recommend an aspiration injection of her knee to confirm the diagnosis  of crystal arthropathy, however, I do not want to do this aspiration  with an elevated INR, and I would prefer to have the INR somewhere  between 2 and 3 prior to doing an aspiration injection.  We would likely  also inject some corticosteroid as well to help with her pain.  She may  also benefit from a knee immobilizer just for comfort.  Therapy may also  be able to help her get up and get around.  Will plan to follow along  and see her and hopefully do an aspiration injection either tomorrow or  the following day depending on her lab values.      Eulas Post, MD  Electronically Signed     JPL/MEDQ  D:  08/12/2007  T:  08/13/2007  Job:  [id number]

## 2010-09-30 NOTE — Op Note (Signed)
[name], [name] NO.:  [id number]   MEDICAL RECORD NO.:  [id number]          PATIENT TYPE:  INP   LOCATION:  6703                         FACILITY:  [facility]   PHYSICIAN:  Eulas Post, MD    DATE OF BIRTH:  07/19/1920   DATE OF PROCEDURE:  08/13/2007  DATE OF DISCHARGE:                               OPERATIVE REPORT   PREOPERATIVE DIAGNOSIS:  Left knee pseudogout.   POSTOPERATIVE DIAGNOSIS:  Left knee pseudogout.   PROCEDURE:  Left knee aspiration and injection.   INJECTABLE MEDICATION:  Solu-Medrol 1 mL with a total of 9 mL of  Marcaine.   ASPIRATED FLUID:  A total of 50 mL of normal-appearing joint fluid.   INDICATIONS FOR PROCEDURE:  Candice Rangel is an 77 year old woman with  atrial fibrillation who was admitted for treatment of pneumonia as well  as falls who has left knee pain that began gradually.  This was  consistent with pseudogout based on her x-rays and her previous history.  The risks, benefits, and alternatives to the aspiration injection were  discussed, and she and her daughter were willing to proceed.   PROCEDURE:  The left knee was prepped with Betadine and aspirated with  an 18-gauge needle.  We waited until her INR was below 4 prior to doing  the procedure.  After aspiration of 50 mL, we then injected Marcaine and  Solu-Medrol.  Pressure was held afterwards, and a sterile dressing was  applied followed by a wrap.   The patient tolerated the procedure well.  She is currently in a knee  immobilizer.  Will plan to send the fluid for Gram stain, culture,  sensitivity and fluid analysis.      Eulas Post, MD  Electronically Signed     JPL/MEDQ  D:  08/13/2007  T:  08/14/2007  Job:  [id number]

## 2010-09-30 NOTE — Procedures (Signed)
EEG NUMBER:  [id number].   HISTORY:  This is a 77 year old patient who is being evaluated for  episode of collapsing, hitting her head.  The patient is being evaluated  for possible seizure event.  Medications include Coumadin, Claritin,  Protonix, Avelox, diltiazem, Lanoxin, Zofran and Ultram.   EEG classification dysrhythmia grade 1, generalized.   DESCRIPTION OF PROCEDURE:  Background consists of a very well-modulated,  medium amplitude, alpha rhythm of 9 Hz that is reactive to eye open and  closure.  As the record progresses, the most notable feature of the  recording is an intermittent, 5-6 Hz theta frequency slowing that seems  to occur in brief, almost paroxysmal episodes and is a bit more  prominent frontally than posteriorly.  This occurs multiple times  throughout the recording lasting usually 3-4 seconds and then abating.  Photic stimulation is performed resulting in a bilateral and symmetric  photic drive response.  Hyperventilation was not performed.  At no time  during the recording does there appear to be evidence of spike or spike-  wave discharges or evidence of focal slowing.  EKG monitor shows no  evidence of cardiac rhythm or abnormalities with a heart rate of 90.   IMPRESSION:  This is an abnormal EEG recording due to intermittent  slowing seen mainly in a bifrontal fashion.  Such recording is  nonspecific and can be seen with any toxic or metabolic state.  The  slowing is a bit unusual in that it is intermittent and almost  paroxysmal in nature and is separated by periods of what appeared to be  quite normal EEG rhythm.  For this reason, would consider the  possibility of these events may be associated lowered seizure threshold.  No clear electrographic seizures were recorded, however.      Marlan Palau, M.D.  Electronically Signed     [id number]  D:  08/10/2007 15:53:59  T:  08/11/2007 10:11:36  Job #:  [id number]

## 2010-10-03 NOTE — Discharge Summary (Signed)
[name], Rangel NO.:  [id number]   MEDICAL RECORD NO.:  [id number]                   PATIENT TYPE:  INP   LOCATION:  2019                                 FACILITY:  [facility]   PHYSICIAN:  Lonia Blood, M.D.            DATE OF BIRTH:  1921-01-28   DATE OF ADMISSION:  04/06/2003  DATE OF DISCHARGE:  04/11/2003                                 DISCHARGE SUMMARY   DISCHARGE DIAGNOSES:  1. New onset atrial fibrillation.     A. Questionably secondary to Sudafed use.     B. Spontaneously resolved.     C. Aspirin and beta blocker therapy only at this time.  2. Hypertension--well controlled.  3. Allergic rhinitis.  4. Allergy to Keflex, penicillin, and Septra.  5. Possible early bibasilar pneumonia--empiric antibiotic therapy.   DISCHARGE MEDICATIONS:  1. Aspirin 81 mg p.o. q.d.  2. Atenolol 50 mg p.o. q.d.  3. Claritin 10 mg p.o. q.d.  4. The patient is specifically instructed not to take any antihistamine or     cold preparation containing Sudafed, pseudoephedrine, or any other     decongestant.   FOLLOWUP:  The patient will follow up with primary care physician at  [facility] in 10-14 days. At that time the patient should  be assessed to assure that she remains in normal sinus rhythm. If she is not  in sinus rhythm, consideration should be given to initiating Coumadin and  pursuing further cardiac workup.   PROCEDURE:  Transthoracic echocardiogram on April 06, 2003--Overall LV  systolic function normal. LV ejection fraction 55%. Mild calcification of  mitral valve.   CONSULTATIONS:  None.   HISTORY OF PRESENT ILLNESS:  [name] is a pleasant 77 year old  female who has enjoyed remarkably good health until the week prior to  admission. She developed progressive weakness and shortness of breath on  exertion. She had no specific chest pain and no other localizing symptoms.  She presented to her primary care  physician's office for evaluation and was  found to be in atrial fibrillation with a rapid ventricular response.  As a  result, she was admitted to the hospital for evaluation.   HOSPITAL COURSE:  1. New onset atrial fibrillation--The patient has no significant coronary     vascular history with the exception to hypertension which has been     reasonably well controlled on prescription medications in the outpatient     setting. She was noted in the emergency department to be in atrial     fibrillation with a rapid ventricular response with a heart rate in the     130 to 140 range. She responded well to a bolus of Cardizem with a     Cardizem IV drip. This was titrated to p.o. Cardizem quite easily.     Decision was then made to transfer this beta blocker, which the patient     tolerated  well. Fortunately, within 24 hours of admission, the patient     spontaneously converted out of atrial fibrillation into normal sinus     rhythm. She remained in normal sinus rhythm with occasional     supraventricular ectopic beats throughout all of hospitalization as     evidenced by telemetry. The patient was ruled out for acute MI with     serial enzymes and EKGs. An echocardiogram was obtained and revealed no     wall motion abnormalities and preserved LV function. The patient had no     anginal symptoms to suggest severe coronary artery disease.  A full     history revealed that the patient had been using Allegra D, plus over-the-     counter decongestant-containing cold preparations. It was felt that the     patient's atrial fibrillation was likely secondary to the use of Sudafed.     The patient was counseled as to the avoidance of Sudafed. Because of     these findings it was not felt that Coumadin would be appropriate to     initiate at this time. The patient should be monitored and, should she     revert back into atrial fibrillation, consideration at that time should     be given to initiating  Coumadin. If she does revert back to atrial     fibrillation, further cardiovascular evaluation would also be indicated.  2. Hypertension--The patient has a history of hypertension which has     previously treated with Diovan 160 mg daily. This was discontinued during     his hospitalization so a negative chromotrope could be used for treatment     of her SVT. The patient tolerated Cardizem well and this was able to be     titrated to beta blocker without difficulty. At the time of discharge she     was tolerating her beta blocker with appropriate heart rate and is in     normal sinus rhythm.  3. Questionable bibasilar infiltrates--In the night prior to the patient's     discharge she had a low-grade fever with temperature up to 100.7.  The     patient did not have strong clinical signs or symptoms of pneumonia or     other source of infection. Chest x-ray did reveal bibasilar atelectasis     with questionable overlying infiltrate. Because of the patient's age and     the desire to keep her from returning to the hospital, the decision was     made to proceed with initiation of empiric antibiotic therapy. She will     be treated with Avelox at 400 mg p.o. daily for seven days total for what     is felt to be an early bibasilar pneumonia. Should the patient develop     severe refractory fever or other respiratory symptoms  she should return     to the hospital for broader spectrum antibiotic therapy.                                                Lonia Blood, M.D.    JTM/MEDQ  D:  04/11/2003  T:  04/12/2003  Job:  [id number]   cc:   [facility]

## 2010-10-03 NOTE — H&P (Signed)
NAMEDARIN, REDMANN NO.:  [id number]   MEDICAL RECORD NO.:  [id number]                   PATIENT TYPE:  EMS   LOCATION:  [facility]                                 FACILITY:  [facility]   PHYSICIAN:  Lonia Blood, M.D.            DATE OF BIRTH:  June 12, 1920   DATE OF ADMISSION:  04/06/2003  DATE OF DISCHARGE:                                HISTORY & PHYSICAL   CHIEF COMPLAINT:  Shortness of breath with dyspnea on exertion.   HISTORY OF PRESENT ILLNESS:  [name] is an 77 year old who has  enjoyed remarkably good health until this last week.  She was in her usual  state of health until approximately three days ago when she began to  experience bilateral lower extremity ankle edema.  In the day following the  onset of her ankle edema, she began to notice significant dyspnea on  exertion.  On the day of admission, this had become so severe that she  presented to her primary practice physician for evaluation.  Evaluation in  the family practice office revealed atrial fibrillation.  The patient has no  prior history of such.  As a result, she was placed within an ambulance and  transported to the [facility] for evaluation.  On my  examination, the patient reports she is comfortable as long as she is still.  When she exerts herself, she does feel palpitations and reports that she  feels markedly short of breath at that time.  She specifically denies chest  pain.   REVIEW OF SYSTEMS:  The review of systems is positive for elements noted in  the history of present illness above with the addition of a severe  headache that has been present now for approximately two days.  The patient  does not have a history of ongoing chronic headaches.  She has no neurologic  deficits.  She has not been losing weight unintentionally.  She has a good  appetite.  Bowels have been moving regularly and she has been passing her  urine without difficulty.   She has no back pain.  She has no focal  neurologic deficits per her history.  She has been very activity and  currently continues to work as a Programmer, applications.  She leads a very active  lifestyle and is independent in doing so.  She drives and normally reports a  very good appetite.   MEDICATIONS:  1. Diovan 160 mg p.o. daily.  2. Clarinex daily.  3. Allegra D q.h.s. p.r.n.  The patient reports using it in the last three     to four days.  4. Over the counter Tylenol Cold and Sinus on a frequent basis.  5. Calcium daily.  6. Aspirin 81 mg daily.   ALLERGIES:  The patient reports allergies to:  1. KEFLEX.  2. PENICILLIN.  3. SEPTRA.   FAMILY HISTORY:  Noncontributory  77   SOCIAL HISTORY:  The patient is currently a short order chef.  She does not  smoke and she does not drink.   DATA REVIEWED:  A 12-lead EKG revealing atrial fibrillation at 127 beats per  minute with no apparent acute ST-T wave changes.  PTT 32, PT 14.8, INR 1.2.  White count 5.2, hemoglobin 13.1, MCV 92, platelets 192, ANC 3.1.  Urinalysis which is unremarkable with the exception of trace hemoglobin, but  negative nitrite or leukocyte esterase with 0-2 white cells and red blood  cells.   PHYSICAL EXAMINATION:  GENERAL APPEARANCE:  A well-developed, well-  nourished, alert, Caucasian female in no acute respiratory distress.  VITAL SIGNS:  Temperature 97.8 degrees, blood pressure 143/88, heart rate  133, respiratory rate 20, O2 saturation 95% on room air.  HEENT:  Normocephalic and atraumatic.  Pupils equal, round, and reactive to  light and accommodation.  Extraocular muscles intact bilaterally.  OC/OP  clear.  NECK:  No lymphadenopathy.  No thyromegaly.  No JVD.  LUNGS:  Clear to auscultation bilaterally with the exception of mild  bibasilar crackles, but no wheeze or rhonchi.  CARDIOVASCULAR:  Irregular rhythm with a rate of 130 beats per minute at  this time with no appreciable murmur.   ABDOMEN:  Nontender and nondistended.  Soft.  Bowel sounds present.  No  hepatosplenomegaly.  No rebound.  No ascites.  EXTREMITIES:  Trace bilateral pedal edema with no cyanosis or clubbing.  NEUROLOGIC:  5/5 strength throughout bilateral upper and lower extremities.  Intact sensation to touch throughout.  No Babinski.  DTRs intact and 1+  throughout.  Cranial nerves II-XII intact.  Alert and oriented x 4.   IMPRESSION AND PLAN:  1. New onset atrial fibrillation.  I am somewhat concerned that Ms. Schildt's     atrial fibrillation could be related to the use of Sudafed and other over     the counter decongestant medications.  I have counseled her on this and     will hold these medications during this hospitalization.  I will check a     TSH as well.  Will rule her out for silent MI.  Will also check a fasting     lipid panel.  Electrocardiogram will be obtained to assess her LV     function and to evaluate for possible wall motion abnormalities.  Further     cardiovascular evaluation will be considered based on the results of her     fasting lipid profile and her electrocardiogram results.  Will cover her     with Lovenox at this time, but would prefer to avoid Coumadin if she does     convert back to normal sinus rhythm.  If she does not, then by Sunday I     would initiate Coumadin therapy on top of her Lovenox.  Cardiovascular     evaluation/consultation will be considered depending upon the results of     the patient's echocardiogram and cardiac enzymes.  2. Hypertension.  The patient's blood pressure is currently reasonably     controlled.  I except it will be better controlled with her Cardizem     drip.  Ultimately she will likely need to continue Cardizem as opposed to     Diovan in the outpatient setting.  3. Allergic rhinitis.  Will continue antihistamine, but assure that it is     not the form that is combined with a decongestant.  Lonia Blood, M.D.    JTM/MEDQ  D:  04/06/2003  T:  04/06/2003  Job:  [id number]

## 2011-01-06 ENCOUNTER — Encounter: Payer: Self-pay | Admitting: Cardiology

## 2011-02-09 LAB — LIPID PANEL
Cholesterol: 123
HDL: 39 — ABNORMAL LOW
Triglycerides: 50

## 2011-02-09 LAB — PROTIME-INR
INR: 2.3 — ABNORMAL HIGH
INR: 2.3 — ABNORMAL HIGH
INR: 2.4 — ABNORMAL HIGH
INR: 2.9 — ABNORMAL HIGH
INR: 3.3 — ABNORMAL HIGH
Prothrombin Time: 25.2 — ABNORMAL HIGH
Prothrombin Time: 26.1 — ABNORMAL HIGH
Prothrombin Time: 26.4 — ABNORMAL HIGH
Prothrombin Time: 27 — ABNORMAL HIGH
Prothrombin Time: 27.1 — ABNORMAL HIGH
Prothrombin Time: 30.8 — ABNORMAL HIGH
Prothrombin Time: 31.1 — ABNORMAL HIGH
Prothrombin Time: 34.6 — ABNORMAL HIGH

## 2011-02-09 LAB — DIFFERENTIAL
Basophils Absolute: 0
Basophils Relative: 0
Eosinophils Absolute: 0
Monocytes Absolute: 0.8
Neutro Abs: 6.3
Neutrophils Relative %: 73

## 2011-02-09 LAB — I-STAT 8, (EC8 V) (CONVERTED LAB)
Acid-base deficit: 2
Bicarbonate: 23.2
HCT: 43
Operator id: 284141
pCO2, Ven: 41.6 — ABNORMAL LOW

## 2011-02-09 LAB — HEPATIC FUNCTION PANEL
Albumin: 3.1 — ABNORMAL LOW
Alkaline Phosphatase: 44
Indirect Bilirubin: 0.9
Total Bilirubin: 1

## 2011-02-09 LAB — BASIC METABOLIC PANEL
Chloride: 107
GFR calc Af Amer: >60
GFR calc non Af Amer: 53 — ABNORMAL LOW
Potassium: 3.9
Sodium: 140

## 2011-02-09 LAB — CULTURE, BODY FLUID W GRAM STAIN -BOTTLE

## 2011-02-09 LAB — URINALYSIS, ROUTINE W REFLEX MICROSCOPIC
Bilirubin Urine: NEGATIVE
Glucose, UA: NEGATIVE
Ketones, ur: NEGATIVE
Nitrite: NEGATIVE
Protein, ur: 100 — AB
Specific Gravity, Urine: 1.022
Urobilinogen, UA: 1
pH: 5.5

## 2011-02-09 LAB — CBC
HCT: 37.6
HCT: 39.4
MCV: 94.7
Platelets: 174
RBC: 4.16
RDW: 15.6 — ABNORMAL HIGH
WBC: 8.4
WBC: 8.5

## 2011-02-09 LAB — BODY FLUID CULTURE: Culture: NO GROWTH

## 2011-02-09 LAB — URINE MICROSCOPIC-ADD ON

## 2011-02-09 LAB — CK TOTAL AND CKMB (NOT AT ARMC)
Relative Index: 2.8 — ABNORMAL HIGH
Total CK: 101

## 2011-02-09 LAB — CREATININE, SERUM
Creatinine, Ser: 0.96
GFR calc Af Amer: >60

## 2011-02-09 LAB — CARDIAC PANEL(CRET KIN+CKTOT+MB+TROPI)
CK, MB: 2
CK, MB: 2.9
Relative Index: 2.8 — ABNORMAL HIGH
Relative Index: INVALID
Total CK: 74
Troponin I: 0.02

## 2011-02-09 LAB — POCT CARDIAC MARKERS: Troponin i, poc: <0.05

## 2011-02-09 LAB — SYNOVIAL FLUID, CRYSTAL: Crystals, Fluid: NONE SEEN

## 2011-02-09 LAB — TSH: TSH: 2.247

## 2011-05-07 ENCOUNTER — Ambulatory Visit (INDEPENDENT_AMBULATORY_CARE_PROVIDER_SITE_OTHER): Payer: Self-pay | Admitting: Cardiology

## 2011-05-07 ENCOUNTER — Encounter: Payer: Self-pay | Admitting: Cardiology

## 2011-05-07 VITALS — BP 122/58 | HR 62 | Ht 62.0 in | Wt 113.0 lb

## 2011-05-07 DIAGNOSIS — I4891 Unspecified atrial fibrillation: Secondary | ICD-10-CM

## 2011-05-07 NOTE — Assessment & Plan Note (Signed)
Blood pressure controlled with present medications. Continue present medications. Potassium and renal function monitored by primary care.

## 2011-05-07 NOTE — Assessment & Plan Note (Signed)
Patient is in permanent atrial fibrillation. Continue Cardizem and digoxin for rate control. Continue Coumadin. INR is monitored by her primary care physician. Her hemoglobin was also monitored by primary care.

## 2011-05-07 NOTE — Patient Instructions (Signed)
Your physician wants you to follow-up in: 1 Year with Dr. Jens Som in [place]. You will receive a reminder letter in the mail two months in advance. If you don't receive a letter, please call our office to schedule the follow-up appointment.  Your physician recommends that you continue on your current medications as directed. Please refer to the Current Medication list given to you today.

## 2011-05-07 NOTE — Progress Notes (Signed)
HPI:Candice Rangel is a pleasant  female whom I initially saw on August 31, 2007.  She had been admitted to [facility] on August 07, 2007 with a syncopal episode.  Of note, a previous echocardiogram in March of 2009 revealed normal LV function, mild mitral and tricuspid regurgitation and moderate biatrial enlargement. There was a large PFO.  We did schedule her to have a Myoview which was performed on September 15, 2007. There was no ischemia or infarction, and her ejection fraction was 78%. She also had a CardioNet monitor.  There was atrial fibrillation noted which was chronic, but there were no significant pauses. I last saw her in Dec 2011. Since then the patient denies any dyspnea on exertion, orthopnea, PND,  palpitations, syncope or chest pain; occasional mild pedal edema. No bleeding.  Current Outpatient Prescriptions  Medication Sig Dispense Refill  . Calcium Carbonate-Vitamin D (CALCIUM-VITAMIN D) 500-200 MG-UNIT per tablet Take 1 tablet by mouth daily.        . digoxin (LANOXIN) 0.125 MG tablet Take 62.5 mcg by mouth daily.        Marland Kitchen diltiazem (CARDIZEM CD) 240 MG 24 hr capsule Take 240 mg by mouth daily.        Marland Kitchen losartan (COZAAR) 50 MG tablet Take 100 mg by mouth daily.       . Multiple Vitamin (MULTIVITAMIN) tablet Take 1 tablet by mouth daily.        Marland Kitchen tiotropium (SPIRIVA) 18 MCG inhalation capsule Place 18 mcg into inhaler and inhale daily.        Marland Kitchen warfarin (COUMADIN) 2.5 MG tablet Take 2.5 mg by mouth as directed.           Past Medical History  Diagnosis Date  . Hypertension   . Atrial fibrillation   . Syncope   . Drug therapy     coumadin  . Pneumonia     History of  . Bronchiectasis     History of    Past Surgical History  Procedure Date  . Knee surgery     Left knee aspiration and injection  . Tonsillectomy   . Cesarean section   . Leg surgery     Following a trauma  . Cataract extraction, bilateral   . Hip fracture surgery     Right    History    Social History  . Marital Status: Married    Spouse Name: N/A    Number of Children: N/A  . Years of Education: N/A   Occupational History  . Co owner of restaraunt    Social History Main Topics  . Smoking status: Never Smoker   . Smokeless tobacco: Not on file  . Alcohol Use: No  . Drug Use: Not on file  . Sexually Active: Not on file   Other Topics Concern  . Not on file   Social History Narrative   Married    ROS: no fevers or chills, productive cough, hemoptysis, dysphasia, odynophagia, melena, hematochezia, dysuria, hematuria, rash, seizure activity, orthopnea, PND, pedal edema, claudication. Remaining systems are negative.  Physical Exam: Well-developed well-nourished in no acute distress.  Skin is warm and dry.  HEENT is normal.  Neck is supple. No thyromegaly.  Chest is clear to auscultation with normal expansion.  Cardiovascular exam is irregular Abdominal exam nontender or distended. No masses palpated. Extremities show no edema. neuro grossly intact  ECG atrial fibrillation with PVCs or aberrantly conducted beats. Cannot rule out prior septal infarct. Nonspecific  ST changes.

## 2011-05-07 NOTE — Assessment & Plan Note (Signed)
Conservative measures given age. 

## 2011-12-17 ENCOUNTER — Emergency Department (HOSPITAL_COMMUNITY)
Admission: EM | Admit: 2011-12-17 | Discharge: 2011-12-17 | Disposition: A | Discharge status: Home / Self Care | Payer: BC Managed Care – PPO | Attending: Emergency Medicine | Admitting: Emergency Medicine

## 2011-12-17 ENCOUNTER — Encounter (HOSPITAL_COMMUNITY): Payer: Self-pay | Admitting: Adult Health

## 2011-12-17 ENCOUNTER — Emergency Department (HOSPITAL_COMMUNITY): Payer: BC Managed Care – PPO

## 2011-12-17 DIAGNOSIS — S60459A Superficial foreign body of unspecified finger, initial encounter: Secondary | ICD-10-CM | POA: Insufficient documentation

## 2011-12-17 DIAGNOSIS — I4891 Unspecified atrial fibrillation: Secondary | ICD-10-CM | POA: Insufficient documentation

## 2011-12-17 DIAGNOSIS — Y93H2 Activity, gardening and landscaping: Secondary | ICD-10-CM | POA: Insufficient documentation

## 2011-12-17 DIAGNOSIS — Y92009 Unspecified place in unspecified non-institutional (private) residence as the place of occurrence of the external cause: Secondary | ICD-10-CM | POA: Insufficient documentation

## 2011-12-17 DIAGNOSIS — W268XXA Contact with other sharp object(s), not elsewhere classified, initial encounter: Secondary | ICD-10-CM | POA: Insufficient documentation

## 2011-12-17 DIAGNOSIS — I10 Essential (primary) hypertension: Secondary | ICD-10-CM | POA: Insufficient documentation

## 2011-12-17 MED ORDER — CLINDAMYCIN HCL 300 MG PO CAPS: 300.0000 mg | ORAL_CAPSULE | Freq: Four times a day (QID) | ORAL | Status: AC

## 2011-12-17 MED ORDER — TRAMADOL HCL 50 MG PO TABS: 50.0000 mg | ORAL_TABLET | Freq: Four times a day (QID) | ORAL | Status: AC | PRN

## 2011-12-17 NOTE — ED Provider Notes (Signed)
History     CSN: [id number]  Arrival date & time 12/17/11  2119   First MD Initiated Contact with Patient 12/17/11 2157      Chief Complaint  Patient presents with  . Hand Injury    HPI  History provided by the patient. Patient is 78 year old female who presents with foreign body through left thumb. Patient states she was outside in the garden when she put her way down on her left hand and thumb with a large wood splinter going through her thumb. There was some associated bleeding. Patient did try to pull slightly on the splinter but had too much pain to remove it. She denies any numbness to the thumb. She denies any other complaints.    Past Medical History  Diagnosis Date  . Hypertension   . Atrial fibrillation   . Syncope   . Drug therapy     coumadin  . Pneumonia     History of  . Bronchiectasis     History of    Past Surgical History  Procedure Date  . Knee surgery     Left knee aspiration and injection  . Tonsillectomy   . Cesarean section   . Leg surgery     Following a trauma  . Cataract extraction, bilateral   . Hip fracture surgery     Right    Family History  Problem Relation Age of Onset  . Heart attack Mother 5  . Pneumonia Father     History  Substance Use Topics  . Smoking status: Never Smoker   . Smokeless tobacco: Not on file  . Alcohol Use: No    OB History    Grav Para Term Preterm Abortions TAB SAB Ect Mult Living                  Review of Systems  Skin:       Wood splinter in left thumb  Neurological: Negative for weakness and numbness.    Allergies  Cephalexin and Penicillins  Home Medications   Current Outpatient Rx  Name Route Sig Dispense Refill  . VITAMIN C PO CHEW Oral Chew 2 tablets by mouth daily.    Marland Kitchen DIGOXIN 0.125 MG PO TABS Oral Take 0.125 mg by mouth every 7 (seven) days. Take on Monday    . DILTIAZEM HCL ER 240 MG PO CP24 Oral Take 240 mg by mouth daily.    Marland Kitchen LOSARTAN POTASSIUM 100 MG PO TABS Oral Take  100 mg by mouth daily.    Marland Kitchen ONE-DAILY MULTI VITAMINS PO TABS Oral Take 1 tablet by mouth daily.      . WARFARIN SODIUM 5 MG PO TABS Oral Take 2.5 mg by mouth See admin instructions. Take 2.5mg  daily except on Monday      BP 151/79  Pulse 95  Temp 97.8 F (36.6 C) (Oral)  Resp 20  SpO2 95%  Physical Exam  Nursing note and vitals reviewed. Constitutional: She is oriented to person, place, and time. She appears well-developed and well-nourished. No distress.  HENT:  Head: Normocephalic.  Cardiovascular: Normal rate and regular rhythm.   Murmur heard. Pulmonary/Chest: Effort normal and breath sounds normal.  Musculoskeletal:       Large wood splinter with entry and exit through the pad of left thumb. Normal medial and lateral sensations with cap refill less than 2 seconds.   Neurological: She is alert and oriented to person, place, and time.  Skin: Skin is warm and dry.  Psychiatric: She has a normal mood and affect. Her behavior is normal.    ED Course  FOREIGN BODY REMOVAL Date/Time: 12/17/2011 10:40 PM Performed by: Angus Seller Authorized by: Angus Seller Consent: Verbal consent obtained. Risks and benefits: risks, benefits and alternatives were discussed Consent given by: patient Patient identity confirmed: verbally with patient Time out: Immediately prior to procedure a "time out" was called to verify the correct patient, procedure, equipment, support staff and site/side marked as required. Body area: skin General location: upper extremity Location details: left thumb Anesthesia: digital block Local anesthetic: lidocaine 2% without epinephrine Localization method: visualized Removal mechanism: forceps Dressing: antibiotic ointment and dressing applied Tendon involvement: superficial Depth: deep Complexity: complex 1 objects recovered. Objects recovered: Large wood splinter in one piece  Post-procedure assessment: foreign body removed Patient tolerance:  Patient tolerated the procedure well with no immediate complications.     Dg Finger Thumb Left  12/17/2011  *RADIOLOGY REPORT*  Clinical Data: Recent traumatic injury, possible foreign body  LEFT THUMB 2+V  Comparison: None.  Findings: There is a mildly radiodense foreign body within the soft tissues of the left first digit.  No acute fracture is seen. Degenerate changes of the interphalangeal joint are noted.  IMPRESSION: Foreign body without acute bony abnormality.  Original Report Authenticated By: Phillips Odor, M.D.     1. Foreign body in skin of finger       MDM  10:30PM patient seen and evaluated. Patient was also seen and discussed with attending physician.        Angus Seller, [place] 12/17/11 [phone]

## 2011-12-17 NOTE — ED Notes (Addendum)
Large splinter through middle of thumb to top of thumb. CMS intact. Bleeding controlled

## 2011-12-19 NOTE — ED Provider Notes (Signed)
Medical screening examination/treatment/procedure(s) were performed by non-physician practitioner and as supervising physician I was immediately available for consultation/collaboration.   Cleland Simkins, MD 12/19/11 1712 

## 2012-05-25 ENCOUNTER — Ambulatory Visit (INDEPENDENT_AMBULATORY_CARE_PROVIDER_SITE_OTHER): Payer: Medicare Other | Admitting: Cardiology

## 2012-05-25 ENCOUNTER — Encounter: Payer: Self-pay | Admitting: Cardiology

## 2012-05-25 VITALS — BP 130/80 | HR 75 | Wt 118.0 lb

## 2012-05-25 DIAGNOSIS — I4891 Unspecified atrial fibrillation: Secondary | ICD-10-CM

## 2012-05-25 DIAGNOSIS — R0600 Dyspnea, unspecified: Secondary | ICD-10-CM

## 2012-05-25 DIAGNOSIS — R0602 Shortness of breath: Secondary | ICD-10-CM

## 2012-05-25 DIAGNOSIS — Q211 Atrial septal defect: Secondary | ICD-10-CM

## 2012-05-25 DIAGNOSIS — R0609 Other forms of dyspnea: Secondary | ICD-10-CM

## 2012-05-25 DIAGNOSIS — I10 Essential (primary) hypertension: Secondary | ICD-10-CM

## 2012-05-25 NOTE — Patient Instructions (Addendum)
Your physician wants you to follow-up in: ONE YEAR WITH DR Jens Som IN [place] You will receive a reminder letter in the mail two months in advance. If you don't receive a letter, please call our office to schedule the follow-up appointment.   Your physician has requested that you have an echocardiogram. Echocardiography is a painless test that uses sound waves to create images of your heart. It provides your doctor with information about the size and shape of your heart and how well your heart's chambers and valves are working. This procedure takes approximately one hour. There are no restrictions for this procedure.   Your physician recommends that you return HAVE LAB WORK TODAY

## 2012-05-25 NOTE — Progress Notes (Signed)
HPI: Candice Rangel is a pleasant female whom I initially saw on August 31, 2007. She had been admitted to [facility] on August 07, 2007 with a syncopal episode. Of note, a previous echocardiogram in March of 2009 revealed normal LV function, mild mitral and tricuspid regurgitation and moderate biatrial enlargement. There was a large PFO. We did schedule her to have a Myoview which was performed on September 15, 2007. There was no ischemia or infarction, and her ejection fraction was 78%. She also had a CardioNet monitor. There was atrial fibrillation noted which was chronic, but there were no significant pauses. I last saw her in Dec 2012. Since then the patient notes DOE but denies orthopnea, PND, palpitations, syncope or chest pain; occasional mild pedal edema. No bleeding.   Current Outpatient Prescriptions  Medication Sig Dispense Refill  . Albuterol (VENTOLIN IN) Inhale into the lungs as needed.      Marland Kitchen Bioflavonoid Products (VITAMIN C) CHEW Chew 2 tablets by mouth daily.      . digoxin (LANOXIN) 0.125 MG tablet Take 0.125 mg by mouth every 7 (seven) days. Take on Monday      . diltiazem (DILACOR XR) 240 MG 24 hr capsule Take 240 mg by mouth daily.      Marland Kitchen losartan (COZAAR) 100 MG tablet Take 100 mg by mouth daily.      . Multiple Vitamin (MULTIVITAMIN) tablet Take 1 tablet by mouth daily.        Marland Kitchen warfarin (COUMADIN) 5 MG tablet Take 2.5 mg by mouth See admin instructions. Take 2.5mg  daily except on Monday         Past Medical History  Diagnosis Date  . Hypertension   . Atrial fibrillation   . Syncope   . Drug therapy     coumadin  . Pneumonia     History of  . Bronchiectasis     History of    Past Surgical History  Procedure Date  . Knee surgery     Left knee aspiration and injection  . Tonsillectomy   . Cesarean section   . Leg surgery     Following a trauma  . Cataract extraction, bilateral   . Hip fracture surgery     Right    History   Social History  .  Marital Status: Married    Spouse Name: N/A    Number of Children: N/A  . Years of Education: N/A   Occupational History  . [employer] owner of [employer]    Social History Main Topics  . Smoking status: Never Smoker   . Smokeless tobacco: Not on file  . Alcohol Use: No  . Drug Use: Not on file  . Sexually Active: Not on file   Other Topics Concern  . Not on file   Social History Narrative   Married    ROS: no fevers or chills, productive cough, hemoptysis, dysphasia, odynophagia, melena, hematochezia, dysuria, hematuria, rash, seizure activity, orthopnea, PND, pedal edema, claudication. Remaining systems are negative.  Physical Exam: Well-developed well-nourished in no acute distress.  Skin is warm and dry.  HEENT is normal.  Neck is supple.  Chest is clear to auscultation with normal expansion.  Cardiovascular exam is irregular Abdominal exam nontender or distended. No masses palpated. Extremities show no edema. neuro grossly intact  ECG atrial fibrillation at a rate of 75. Occasional PVCs or aberrantly conducted beat. Left axis deviation. Cannot rule out prior septal infarct. Nonspecific ST changes

## 2012-05-25 NOTE — Assessment & Plan Note (Signed)
Patient complains of new dyspnea on exertion. However it improves with her inhalers. She is not lying overloaded. Check BNP. Repeat echocardiogram for LV function.

## 2012-05-25 NOTE — Assessment & Plan Note (Signed)
Blood pressure controlled. Continue present medications. 

## 2012-05-25 NOTE — Assessment & Plan Note (Signed)
Conservative measures given age. 

## 2012-05-25 NOTE — Assessment & Plan Note (Signed)
Continue present medications for rate control. Continue Coumadin. INR is followed by primary care.

## 2012-05-26 ENCOUNTER — Other Ambulatory Visit: Payer: Self-pay | Admitting: *Deleted

## 2012-05-26 DIAGNOSIS — R0602 Shortness of breath: Secondary | ICD-10-CM

## 2012-05-26 LAB — BRAIN NATRIURETIC PEPTIDE: Brain Natriuretic Peptide: 201.4 pg/mL — ABNORMAL HIGH (ref 0.0–100.0)

## 2012-05-26 MED ORDER — FUROSEMIDE 20 MG PO TABS: ORAL_TABLET | ORAL | Status: DC

## 2012-05-31 ENCOUNTER — Ambulatory Visit (HOSPITAL_COMMUNITY): Payer: MEDICARE | Attending: Cardiology | Admitting: Radiology

## 2012-05-31 ENCOUNTER — Other Ambulatory Visit (INDEPENDENT_AMBULATORY_CARE_PROVIDER_SITE_OTHER): Payer: Medicare Other

## 2012-05-31 DIAGNOSIS — I4891 Unspecified atrial fibrillation: Secondary | ICD-10-CM

## 2012-05-31 DIAGNOSIS — I369 Nonrheumatic tricuspid valve disorder, unspecified: Secondary | ICD-10-CM | POA: Insufficient documentation

## 2012-05-31 DIAGNOSIS — I379 Nonrheumatic pulmonary valve disorder, unspecified: Secondary | ICD-10-CM | POA: Insufficient documentation

## 2012-05-31 DIAGNOSIS — I10 Essential (primary) hypertension: Secondary | ICD-10-CM | POA: Insufficient documentation

## 2012-05-31 DIAGNOSIS — R0602 Shortness of breath: Secondary | ICD-10-CM

## 2012-05-31 DIAGNOSIS — Q2111 Secundum atrial septal defect: Secondary | ICD-10-CM

## 2012-05-31 DIAGNOSIS — Q211 Atrial septal defect: Secondary | ICD-10-CM | POA: Insufficient documentation

## 2012-05-31 DIAGNOSIS — I059 Rheumatic mitral valve disease, unspecified: Secondary | ICD-10-CM | POA: Insufficient documentation

## 2012-05-31 LAB — BASIC METABOLIC PANEL
BUN: 31 mg/dL — ABNORMAL HIGH (ref 6–23)
Creatinine, Ser: 1.5 mg/dL — ABNORMAL HIGH (ref 0.4–1.2)
GFR: 35.82 mL/min — ABNORMAL LOW (ref >60.00)

## 2012-05-31 NOTE — Progress Notes (Signed)
Echocardiogram performed.  

## 2012-06-01 ENCOUNTER — Other Ambulatory Visit: Payer: Self-pay | Admitting: *Deleted

## 2012-06-01 DIAGNOSIS — N289 Disorder of kidney and ureter, unspecified: Secondary | ICD-10-CM

## 2012-06-01 MED ORDER — FUROSEMIDE 20 MG PO TABS: ORAL_TABLET | ORAL | Status: DC

## 2013-11-30 ENCOUNTER — Telehealth: Payer: Self-pay | Admitting: Cardiology

## 2013-11-30 NOTE — Telephone Encounter (Signed)
Left message returning patients call to schedule an appointment.

## 2014-01-03 ENCOUNTER — Ambulatory Visit (INDEPENDENT_AMBULATORY_CARE_PROVIDER_SITE_OTHER): Payer: Medicare HMO | Admitting: Cardiology

## 2014-01-03 ENCOUNTER — Encounter: Payer: Self-pay | Admitting: Cardiology

## 2014-01-03 VITALS — BP 142/60 | HR 68 | Ht 62.0 in | Wt 116.8 lb

## 2014-01-03 DIAGNOSIS — I4891 Unspecified atrial fibrillation: Secondary | ICD-10-CM

## 2014-01-03 DIAGNOSIS — Q211 Atrial septal defect: Secondary | ICD-10-CM

## 2014-01-03 DIAGNOSIS — Q2111 Secundum atrial septal defect: Secondary | ICD-10-CM

## 2014-01-03 DIAGNOSIS — I10 Essential (primary) hypertension: Secondary | ICD-10-CM

## 2014-01-03 NOTE — Assessment & Plan Note (Signed)
Conservative measures given age. 

## 2014-01-03 NOTE — Patient Instructions (Signed)
Your physician wants you to follow-up in: ONE YEAR WITH DR CRENSHAW You will receive a reminder letter in the mail two months in advance. If you don't receive a letter, please call our office to schedule the follow-up appointment.  

## 2014-01-03 NOTE — Progress Notes (Signed)
      HPI: FU atrial fibrillation. Syncope 2009. Myovue on September 15, 2007 showed no ischemia or infarction, and her ejection fraction was 78%. She also had a CardioNet monitor. There was atrial fibrillation noted which was chronic, but there were no significant pauses. Echocardiogram repeated in January of 2014. LV function normal. Mild mitral regurgitation. Biatrial enlargement. Moderate tricuspid regurgitation and mild to moderate elevation in pulmonary pressures. Small secundum ASD.Since I last saw her, She has mild dyspnea on exertion and occasional mild pedal edema. No chest pain, palpitations, syncope or bleeding   Current Outpatient Prescriptions  Medication Sig Dispense Refill  . Albuterol (VENTOLIN IN) Inhale into the lungs as needed.      Marland Kitchen. Bioflavonoid Products (VITAMIN C) CHEW Chew 2 tablets by mouth daily.      . digoxin (LANOXIN) 0.125 MG tablet Take 0.125 mg by mouth every 7 (seven) days. Take on Monday      . diltiazem (DILACOR XR) 240 MG 24 hr capsule Take 240 mg by mouth daily.      Marland Kitchen. losartan (COZAAR) 100 MG tablet Take 100 mg by mouth daily.      . Multiple Vitamin (MULTIVITAMIN) tablet Take 1 tablet by mouth daily.        Marland Kitchen. warfarin (COUMADIN) 5 MG tablet Take 2.5 mg by mouth See admin instructions. Take 2.5mg  daily except on Monday       No current facility-administered medications for this visit.     Past Medical History  Diagnosis Date  . Hypertension   . Atrial fibrillation   . Syncope   . Drug therapy     coumadin  . Pneumonia     History of  . Bronchiectasis     History of    Past Surgical History  Procedure Laterality Date  . Knee surgery      Left knee aspiration and injection  . Tonsillectomy    . Cesarean section    . Leg surgery      Following a trauma  . Cataract extraction, bilateral    . Hip fracture surgery      Right    History   Social History  . Marital Status: Married    Spouse Name: N/A    Number of Children: N/A  . Years  of Education: N/A   Occupational History  . [employer] owner of rest[employer]    Social History Main Topics  . Smoking status: Never Smoker   . Smokeless tobacco: Not on file  . Alcohol Use: No  . Drug Use: Not on file  . Sexual Activity: Not on file   Other Topics Concern  . Not on file   Social History Narrative   Married    ROS: no fevers or chills, productive cough, hemoptysis, dysphasia, odynophagia, melena, hematochezia, dysuria, hematuria, rash, seizure activity, orthopnea, PND, claudication. Remaining systems are negative.  Physical Exam: Well-developed well-nourished in no acute distress.  Skin is warm and dry.  HEENT is normal.  Neck is supple.  Chest is clear to auscultation with normal expansion.  Cardiovascular exam is irregular Abdominal exam nontender or distended. No masses palpated. Extremities show Trace edema. neuro grossly intact  ECG Atrial fibrillation with PVCs or aberrantly conducted beats. Left axis deviation. Septal infarct. Nonspecific ST changes.

## 2014-01-03 NOTE — Assessment & Plan Note (Signed)
Blood pressure controlled. Continue present medications. Potassium and renal function monitored by primary care. 

## 2014-01-03 NOTE — Assessment & Plan Note (Signed)
Continue Cardizem, digoxin and Coumadin. INR monitored by primary care. Hemoglobin followed by primary care.

## 2014-02-05 ENCOUNTER — Emergency Department (HOSPITAL_COMMUNITY): Payer: Medicare HMO

## 2014-02-05 ENCOUNTER — Encounter (HOSPITAL_COMMUNITY): Payer: Self-pay | Admitting: Emergency Medicine

## 2014-02-05 ENCOUNTER — Inpatient Hospital Stay (HOSPITAL_COMMUNITY)
Admission: EM | Admit: 2014-02-05 | Discharge: 2014-02-08 | DRG: 293 | Disposition: A | Discharge status: Home / Self Care | Payer: Medicare HMO | Attending: Internal Medicine | Admitting: Internal Medicine

## 2014-02-05 DIAGNOSIS — I5033 Acute on chronic diastolic (congestive) heart failure: Principal | ICD-10-CM | POA: Diagnosis present

## 2014-02-05 DIAGNOSIS — Z7901 Long term (current) use of anticoagulants: Secondary | ICD-10-CM | POA: Diagnosis not present

## 2014-02-05 DIAGNOSIS — I5031 Acute diastolic (congestive) heart failure: Secondary | ICD-10-CM

## 2014-02-05 DIAGNOSIS — I129 Hypertensive chronic kidney disease with stage 1 through stage 4 chronic kidney disease, or unspecified chronic kidney disease: Secondary | ICD-10-CM | POA: Diagnosis present

## 2014-02-05 DIAGNOSIS — I482 Chronic atrial fibrillation, unspecified: Secondary | ICD-10-CM

## 2014-02-05 DIAGNOSIS — I4891 Unspecified atrial fibrillation: Secondary | ICD-10-CM | POA: Diagnosis present

## 2014-02-05 DIAGNOSIS — J849 Interstitial pulmonary disease, unspecified: Secondary | ICD-10-CM

## 2014-02-05 DIAGNOSIS — R55 Syncope and collapse: Secondary | ICD-10-CM

## 2014-02-05 DIAGNOSIS — N183 Chronic kidney disease, stage 3 unspecified: Secondary | ICD-10-CM | POA: Diagnosis present

## 2014-02-05 DIAGNOSIS — I509 Heart failure, unspecified: Secondary | ICD-10-CM | POA: Diagnosis present

## 2014-02-05 DIAGNOSIS — J9611 Chronic respiratory failure with hypoxia: Secondary | ICD-10-CM

## 2014-02-05 DIAGNOSIS — R0600 Dyspnea, unspecified: Secondary | ICD-10-CM

## 2014-02-05 DIAGNOSIS — Q211 Atrial septal defect: Secondary | ICD-10-CM

## 2014-02-05 DIAGNOSIS — Z9981 Dependence on supplemental oxygen: Secondary | ICD-10-CM

## 2014-02-05 DIAGNOSIS — J8410 Pulmonary fibrosis, unspecified: Secondary | ICD-10-CM | POA: Diagnosis present

## 2014-02-05 DIAGNOSIS — I5021 Acute systolic (congestive) heart failure: Secondary | ICD-10-CM

## 2014-02-05 DIAGNOSIS — I10 Essential (primary) hypertension: Secondary | ICD-10-CM

## 2014-02-05 DIAGNOSIS — Q2111 Secundum atrial septal defect: Secondary | ICD-10-CM

## 2014-02-05 LAB — CBC WITH DIFFERENTIAL/PLATELET
BASOS PCT: 0 % (ref 0–1)
Basophils Absolute: 0 10*3/uL (ref 0.0–0.1)
EOS ABS: 0 10*3/uL (ref 0.0–0.7)
EOS PCT: 0 % (ref 0–5)
HEMATOCRIT: 43.1 % (ref 36.0–46.0)
HEMOGLOBIN: 14 g/dL (ref 12.0–15.0)
Lymphocytes Relative: 13 % (ref 12–46)
Lymphs Abs: 0.9 10*3/uL (ref 0.7–4.0)
MCH: 31.4 pg (ref 26.0–34.0)
MCHC: 32.5 g/dL (ref 30.0–36.0)
MCV: 96.6 fL (ref 78.0–100.0)
MONO ABS: 0.7 10*3/uL (ref 0.1–1.0)
MONOS PCT: 10 % (ref 3–12)
Neutro Abs: 5.4 10*3/uL (ref 1.7–7.7)
Neutrophils Relative %: 77 % (ref 43–77)
Platelets: 179 10*3/uL (ref 150–400)
RBC: 4.46 MIL/uL (ref 3.87–5.11)
RDW: 16.8 % — ABNORMAL HIGH (ref 11.5–15.5)
WBC: 7 10*3/uL (ref 4.0–10.5)

## 2014-02-05 LAB — PRO B NATRIURETIC PEPTIDE: Pro B Natriuretic peptide (BNP): 3066 pg/mL — ABNORMAL HIGH (ref 0–450)

## 2014-02-05 LAB — COMPREHENSIVE METABOLIC PANEL
ALBUMIN: 3.7 g/dL (ref 3.5–5.2)
ALT: 31 U/L (ref 0–35)
ANION GAP: 12 (ref 5–15)
AST: 34 U/L (ref 0–37)
Alkaline Phosphatase: 62 U/L (ref 39–117)
BILIRUBIN TOTAL: 0.8 mg/dL (ref 0.3–1.2)
BUN: 30 mg/dL — AB (ref 6–23)
CO2: 24 mEq/L (ref 19–32)
CREATININE: 1.12 mg/dL — AB (ref 0.50–1.10)
Calcium: 9.3 mg/dL (ref 8.4–10.5)
Chloride: 103 mEq/L (ref 96–112)
GFR calc non Af Amer: 42 mL/min — ABNORMAL LOW (ref >90)
GFR, EST AFRICAN AMERICAN: 48 mL/min — AB (ref >90)
GLUCOSE: 115 mg/dL — AB (ref 70–99)
Potassium: 4.5 mEq/L (ref 3.7–5.3)
Sodium: 139 mEq/L (ref 137–147)
TOTAL PROTEIN: 8.7 g/dL — AB (ref 6.0–8.3)

## 2014-02-05 LAB — CBC
HCT: 42.4 % (ref 36.0–46.0)
Hemoglobin: 14 g/dL (ref 12.0–15.0)
MCH: 32.1 pg (ref 26.0–34.0)
MCHC: 33 g/dL (ref 30.0–36.0)
MCV: 97.2 fL (ref 78.0–100.0)
PLATELETS: 179 10*3/uL (ref 150–400)
RBC: 4.36 MIL/uL (ref 3.87–5.11)
RDW: 16.7 % — AB (ref 11.5–15.5)
WBC: 9.1 10*3/uL (ref 4.0–10.5)

## 2014-02-05 LAB — PROTIME-INR
INR: 1.79 — ABNORMAL HIGH (ref 0.00–1.49)
PROTHROMBIN TIME: 20.8 s — AB (ref 11.6–15.2)

## 2014-02-05 LAB — TSH: TSH: 2.06 u[IU]/mL (ref 0.350–4.500)

## 2014-02-05 LAB — TROPONIN I: Troponin I: <0.3 ng/mL (ref <0.30)

## 2014-02-05 LAB — HEMOGLOBIN A1C
HEMOGLOBIN A1C: 6 % — AB (ref <5.7)
Mean Plasma Glucose: 126 mg/dL — ABNORMAL HIGH (ref <117)

## 2014-02-05 LAB — I-STAT TROPONIN, ED: TROPONIN I, POC: 0.02 ng/mL (ref 0.00–0.08)

## 2014-02-05 LAB — CREATININE, SERUM
Creatinine, Ser: 1.3 mg/dL — ABNORMAL HIGH (ref 0.50–1.10)
GFR calc Af Amer: 40 mL/min — ABNORMAL LOW (ref >90)
GFR, EST NON AFRICAN AMERICAN: 35 mL/min — AB (ref >90)

## 2014-02-05 MED ORDER — TECHNETIUM TO 99M ALBUMIN AGGREGATED
5.5000 | Freq: Once | INTRAVENOUS | Status: AC | PRN
  Administered 2014-02-05: 6 via INTRAVENOUS

## 2014-02-05 MED ORDER — LEVALBUTEROL HCL 0.63 MG/3ML IN NEBU: 0.6300 mg | INHALATION_SOLUTION | Freq: Four times a day (QID) | RESPIRATORY_TRACT | Status: DC | PRN

## 2014-02-05 MED ORDER — ENOXAPARIN SODIUM 40 MG/0.4ML ~~LOC~~ SOLN
40.0000 mg | SUBCUTANEOUS | Status: DC
  Administered 2014-02-05: 40 mg via SUBCUTANEOUS
  Filled 2014-02-05 (×2): qty 0.4

## 2014-02-05 MED ORDER — ACETAMINOPHEN 650 MG RE SUPP
650.0000 mg | Freq: Four times a day (QID) | RECTAL | Status: DC | PRN
Start: 2014-02-05 — End: 2014-02-08

## 2014-02-05 MED ORDER — SODIUM CHLORIDE 0.9 % IJ SOLN
3.0000 mL | Freq: Two times a day (BID) | INTRAMUSCULAR | Status: DC
  Administered 2014-02-05 – 2014-02-08 (×6): 3 mL via INTRAVENOUS

## 2014-02-05 MED ORDER — WARFARIN SODIUM 4 MG PO TABS
4.0000 mg | ORAL_TABLET | Freq: Once | ORAL | Status: AC
Start: 2014-02-05 — End: 2014-02-05
  Administered 2014-02-05: 4 mg via ORAL
  Filled 2014-02-05 (×3): qty 1

## 2014-02-05 MED ORDER — DIGOXIN 125 MCG PO TABS: 0.1250 mg | ORAL_TABLET | ORAL | Status: DC

## 2014-02-05 MED ORDER — ACETAMINOPHEN 325 MG PO TABS
650.0000 mg | ORAL_TABLET | Freq: Four times a day (QID) | ORAL | Status: DC | PRN
Start: 2014-02-05 — End: 2014-02-08

## 2014-02-05 MED ORDER — LOSARTAN POTASSIUM 50 MG PO TABS
50.0000 mg | ORAL_TABLET | Freq: Every day | ORAL | Status: DC
  Administered 2014-02-05 – 2014-02-06 (×2): 50 mg via ORAL
  Filled 2014-02-05 (×2): qty 1

## 2014-02-05 MED ORDER — ONDANSETRON HCL 4 MG/2ML IJ SOLN: 4.0000 mg | Freq: Four times a day (QID) | INTRAMUSCULAR | Status: DC | PRN

## 2014-02-05 MED ORDER — SODIUM CHLORIDE 0.9 % IJ SOLN: 3.0000 mL | INTRAMUSCULAR | Status: DC | PRN

## 2014-02-05 MED ORDER — TECHNETIUM TC 99M DIETHYLENETRIAME-PENTAACETIC ACID: 40.1000 | Freq: Once | INTRAVENOUS | Status: AC | PRN

## 2014-02-05 MED ORDER — DILTIAZEM HCL ER 240 MG PO CP24
240.0000 mg | ORAL_CAPSULE | Freq: Every day | ORAL | Status: DC
  Administered 2014-02-05 – 2014-02-06 (×2): 240 mg via ORAL
  Filled 2014-02-05 (×2): qty 1

## 2014-02-05 MED ORDER — ONDANSETRON HCL 4 MG/2ML IJ SOLN: 4.0000 mg | Freq: Three times a day (TID) | INTRAMUSCULAR | Status: DC | PRN

## 2014-02-05 MED ORDER — SODIUM CHLORIDE 0.9 % IJ SOLN: 3.0000 mL | Freq: Two times a day (BID) | INTRAMUSCULAR | Status: DC

## 2014-02-05 MED ORDER — SODIUM CHLORIDE 0.9 % IV SOLN: 250.0000 mL | INTRAVENOUS | Status: DC | PRN

## 2014-02-05 MED ORDER — FUROSEMIDE 10 MG/ML IJ SOLN
40.0000 mg | INTRAMUSCULAR | Status: AC
  Administered 2014-02-05: 40 mg via INTRAVENOUS
  Filled 2014-02-05: qty 4

## 2014-02-05 MED ORDER — FUROSEMIDE 10 MG/ML IJ SOLN
40.0000 mg | Freq: Two times a day (BID) | INTRAMUSCULAR | Status: DC
  Administered 2014-02-05 – 2014-02-07 (×4): 40 mg via INTRAVENOUS
  Filled 2014-02-05 (×6): qty 4

## 2014-02-05 MED ORDER — ONDANSETRON HCL 4 MG PO TABS: 4.0000 mg | ORAL_TABLET | Freq: Four times a day (QID) | ORAL | Status: DC | PRN

## 2014-02-05 MED ORDER — WARFARIN - PHARMACIST DOSING INPATIENT: Freq: Every day | Status: DC

## 2014-02-05 NOTE — Progress Notes (Signed)
ANTICOAGULATION CONSULT NOTE - Initial Consult  Pharmacy Consult for Coumadin Indication: atrial fibrillation  Allergies  Allergen Reactions  . Cephalexin Other (See Comments)    "Causes an infection"  . Penicillins Swelling    Patient Measurements:    Vital Signs: Temp: 97.4 F (36.3 C) (09/21 1051) Temp src: Oral (09/21 1051) BP: 178/85 mmHg (09/21 1602) Pulse Rate: 83 (09/21 1602)  Labs:  Recent Labs  02/05/14 1129  HGB 14.0  HCT 43.1  PLT 179  LABPROT 20.8*  INR 1.79*  CREATININE 1.12*    The CrCl is unknown because both a height and weight (above a minimum accepted value) are required for this calculation.   Medical History: Past Medical History  Diagnosis Date  . Hypertension   . Atrial fibrillation   . Syncope   . Drug therapy     coumadin  . Pneumonia     History of  . Bronchiectasis     History of    Medications:   (Not in a hospital admission) Scheduled:  . enoxaparin (LOVENOX) injection  40 mg Subcutaneous Q24H  . sodium chloride  3 mL Intravenous Q12H  . sodium chloride  3 mL Intravenous Q12H    Assessment: 80yo F admitted with progressively worsening SOB x 2 weeks. Recently took a bus trip to [place] but denies any leg swelling or chest pain. VQ scan low probability of PE. BNP is 3066. Pharmacy is asked to monitor her Coumadin for A.fib. Home dose is 2.5mg  daily except nothing on Mondays.   INR is slightly subtherapeutic on admission: 1.79   CBC is wnl.  SCr 1.12, CrCl ~37N.  MD has ordered prophylactic-dose Lovenox as part of admission order set.  Goal of Therapy:  INR 2-3 Monitor platelets by anticoagulation protocol: Yes   Plan:   Give Coumadin  now.  Recommend increasing Lovenox to full-dose if INR not in therapeutic range on 9/22.  F/u daily.  Charolotte Eke, PharmD, pager [phone]. 02/05/2014,5:02 PM.

## 2014-02-05 NOTE — ED Provider Notes (Signed)
CSN: [id number]     Arrival date & time 02/05/14  1040 History   First MD Initiated Contact with Patient 02/05/14 1336     Chief Complaint  Patient presents with  . Shortness of Breath     (Consider location/radiation/quality/duration/timing/severity/associated sxs/prior Treatment) HPI Comments: Patient is a 80 year old female with a past medical history of patent foramen ovale, hypertension and atrial fibrillation who presents with shortness of breath for the past 2 weeks. Symptoms started gradually and progressively worsened since the onset. Patient reports the shortness of breath becomes worse with exertion. Patient reports she also went on a recent bus trip to [place]. She reports associated dry cough. She denies any leg swelling or chest pain. Patient takes coumadin for atrial fibrillation. She does not wear oxygen at home and has never had symptoms like this previously. She denies any recent hospitalizations, surgeries, or previous blood clots. No other associated symptoms. No aggravating/alleviating factors.   Patient is a 80 y.o. female presenting with shortness of breath.  Shortness of Breath Associated symptoms: cough   Associated symptoms: no abdominal pain, no chest pain, no fever, no neck pain and no vomiting     Past Medical History  Diagnosis Date  . Hypertension   . Atrial fibrillation   . Syncope   . Drug therapy     coumadin  . Pneumonia     History of  . Bronchiectasis     History of   Past Surgical History  Procedure Laterality Date  . Knee surgery      Left knee aspiration and injection  . Tonsillectomy    . Cesarean section    . Leg surgery      Following a trauma  . Cataract extraction, bilateral    . Hip fracture surgery      Right   Family History  Problem Relation Age of Onset  . Heart attack Mother 50  . Pneumonia Father    History  Substance Use Topics  . Smoking status: Never Smoker   . Smokeless tobacco: Not on file  . Alcohol Use: No    OB History   Grav Para Term Preterm Abortions TAB SAB Ect Mult Living                 Review of Systems  Constitutional: Negative for fever, chills and fatigue.  HENT: Negative for trouble swallowing.   Eyes: Negative for visual disturbance.  Respiratory: Positive for cough and shortness of breath.   Cardiovascular: Negative for chest pain and palpitations.  Gastrointestinal: Negative for nausea, vomiting, abdominal pain and diarrhea.  Genitourinary: Negative for dysuria and difficulty urinating.  Musculoskeletal: Negative for arthralgias and neck pain.  Skin: Negative for color change.  Neurological: Negative for dizziness and weakness.  Psychiatric/Behavioral: Negative for dysphoric mood.      Allergies  Cephalexin and Penicillins  Home Medications   Prior to Admission medications   Medication Sig Start Date End Date Taking? Authorizing Provider  Bioflavonoid Products (VITAMIN C) CHEW Chew 2 tablets by mouth daily.   Yes Historical Provider, MD  cetirizine (ZYRTEC) 10 MG tablet Take 10 mg by mouth daily.   Yes Historical Provider, MD  digoxin (LANOXIN) 0.125 MG tablet Take 0.125 mg by mouth every 7 (seven) days. Take on Monday   Yes Historical Provider, MD  diltiazem (DILACOR XR) 240 MG 24 hr capsule Take 240 mg by mouth daily.   Yes Historical Provider, MD  Fluticasone Furoate-Vilanterol (BREO ELLIPTA) 200-25 MCG/INH AEPB  Inhale 1 Inhaler into the lungs daily.    Yes Historical Provider, MD  losartan (COZAAR) 100 MG tablet Take 100 mg by mouth daily.   Yes Historical Provider, MD  Multiple Vitamin (MULTIVITAMIN) tablet Take 1 tablet by mouth daily.     Yes Historical Provider, MD  warfarin (COUMADIN) 5 MG tablet Take 2.5 mg by mouth See admin instructions. Take 2.5mg  daily except on Monday   Yes Historical Provider, MD   BP 165/78  Pulse 85  Temp(Src) 97.4 F (36.3 C) (Oral)  Resp 30  SpO2 95% Physical Exam  Nursing note and vitals reviewed. Constitutional: She is  oriented to person, place, and time. She appears well-developed and well-nourished. No distress.  HENT:  Head: Normocephalic and atraumatic.  Eyes: Conjunctivae and EOM are normal.  Neck: Normal range of motion.  Cardiovascular: Normal rate.  Exam reveals no gallop and no friction rub.   No murmur heard. Irregularly irregular rhythm.   Pulmonary/Chest: Effort normal and breath sounds normal. She has no wheezes. She has no rales. She exhibits no tenderness.  Abdominal: Soft. She exhibits no distension. There is no tenderness. There is no rebound and no guarding.  Musculoskeletal: Normal range of motion.  No calf tenderness to palpation or lower extremity swelling.   Neurological: She is alert and oriented to person, place, and time. Coordination normal.  Speech is goal-oriented. Moves limbs without ataxia.   Skin: Skin is warm and dry.  Psychiatric: She has a normal mood and affect. Her behavior is normal.    ED Course  Procedures (including critical care time) Labs Review Labs Reviewed  CBC WITH DIFFERENTIAL - Abnormal; Notable for the following:    RDW 16.8 (*)    All other components within normal limits  COMPREHENSIVE METABOLIC PANEL - Abnormal; Notable for the following:    Glucose, Bld 115 (*)    BUN 30 (*)    Creatinine, Ser 1.12 (*)    Total Protein 8.7 (*)    GFR calc non Af Amer 42 (*)    GFR calc Af Amer 48 (*)    All other components within normal limits  PROTIME-INR - Abnormal; Notable for the following:    Prothrombin Time 20.8 (*)    INR 1.79 (*)    All other components within normal limits  PRO B NATRIURETIC PEPTIDE - Abnormal; Notable for the following:    Pro B Natriuretic peptide (BNP) 3066.0 (*)    All other components within normal limits  I-STAT TROPOININ, ED     Date: 02/05/2014  Rate: 85  Rhythm: atrial fibrillation  QRS Axis: normal  Intervals: normal  ST/T Wave abnormalities: nonspecific ST/T changes  Conduction Disutrbances:none  Narrative  Interpretation: atrial fibrillation unchanged from previous  Old EKG Reviewed: unchanged   Imaging Review Dg Chest 2 View  02/05/2014   CLINICAL DATA:  Shortness of breath.  Hypertension.  EXAM: CHEST  2 VIEW  COMPARISON:  06/27/2008.  FINDINGS: The heart remains grossly normal in size. No significant change in linear density and small amount of pleural fluid at the left lung base. Increased patchy density in pleural fluid at the right lung base. Stable mildly prominent interstitial markings. Diffuse osteopenia. Atheromatous arterial calcifications.  IMPRESSION: 1. Bibasilar atelectasis and possible pneumonia with bilateral pleural effusions, greater on the right. 2. Stable chronic interstitial lung disease.   Electronically Signed   By: Gordan Payment M.D.   On: 02/05/2014 12:05   Nm Pulmonary Perf And Vent  02/05/2014  CLINICAL DATA:  Shortness of breath for 1 week. Bilateral airspace opacities and pleural effusions on recent radiographs.  EXAM: NUCLEAR MEDICINE VENTILATION - PERFUSION LUNG SCAN  TECHNIQUE: Ventilation images were obtained in multiple projections using inhaled aerosol technetium 99 M DTPA. Perfusion images were obtained in multiple projections after intravenous injection of Tc-31m MAA.  RADIOPHARMACEUTICALS:  40.1 mCi Tc-34m DTPA aerosol and 5.5 mCi Tc-15m MAA  COMPARISON:  Radiographs 06/27/2008 and 02/05/2014.  FINDINGS: Ventilation: There is decreased ventilation in both lung bases. There is central clumping of the aerosol within the tracheobronchial tree.  Perfusion: Decreased perfusion to both lung bases is matched to the ventilatory abnormalities and corresponds with bilateral pleural effusions. There are no segmental perfusion mismatches.  IMPRESSION: Low probability for acute pulmonary embolism. There are ventilation and perfusion defects in both lung bases corresponding with bilateral pleural effusions.   Electronically Signed   By: Roxy Horseman M.D.   On: 02/05/2014 15:30      EKG Interpretation None      MDM   Final diagnoses:  Acute systolic congestive heart failure  Dependence on supplemental oxygen    3:27 PM Labs unremarkable for acute changes. Patient's INR is sub therapeutic at 1.79. Chest xray shows bibasilar atelectasis and possible pneumonia with bilateral pleural effusions. Patient has afib and is currently in atrial fibrillation rhythm. She is now oxygen dependent here in the ED and having difficulty breathing, especially when ambulating. VQ scan pending to rule out PE. Patient was not a candidate for CT angio due to GFR of 27.   4:18 PM VQ scan shows low probability for PE. Patient likely having CHF exacerbation due to elevated BNP and bilateral pleural effusions. Patient will have  IV lasix here. Patient will be admitted to hospitalist.    Emilia Beck, PA-C 02/05/14 1619

## 2014-02-05 NOTE — ED Provider Notes (Signed)
Medical screening examination/treatment/procedure(s) were performed by non-physician practitioner and as supervising physician I was immediately available for consultation/collaboration.   EKG Interpretation None       Torren Maffeo, MD 02/05/14 1649 

## 2014-02-05 NOTE — ED Notes (Signed)
Pt transported to Nuc [facility]. 

## 2014-02-05 NOTE — ED Notes (Signed)
Pt c/o shob for past 2 weeks. Pt does have a cough that has some yellow phlegm.

## 2014-02-05 NOTE — ED Notes (Signed)
Pt given a mouth swab and a little water.

## 2014-02-05 NOTE — H&P (Signed)
[facility] History and Physical  [facility] ZOX:[id number] DOB: 01-01-1921 DOA: 02/05/2014  Referring physician: PCP: Pamelia Hoit, MD   Chief Complaint: Shortness of breath HPI:  80 year old female with a history of atrial fibrillation, Syncope 2009 with a Myoview on September 15, 2007 which was negative for ischemia or infarction, 2-D echo in 1/14 showed EF of 78%, status post evaluation with a CardioNet monitor that showed atrial fibrillation  which was chronic,  On chronic anticoagulation, last  Echocardiogram   in January of 2014 showed LV function normal. Mild mitral regurgitation. Biatrial enlargement. Moderate tricuspid regurgitation and mild to moderate elevation in pulmonary pressures. Small secundum ASD. Patient presents today with worsening dyspnea on exertion for the last couple of weeks. Initially her shortness of breath was only when walking to the mailbox. Now it is progressively getting worse with dyspnea on exertion with ambulation inside the house. She also feels that she would pass out early the morning and she wakes up. Patient was found to be hypoxic with SaO2 of 87% upon presentation.Patient reports she also went on a recent bus trip to [place]. She reports associated dry cough. She denies any leg swelling or chest pain. Patient takes coumadin for atrial fibrillation. She does not wear oxygen at home and has never had symptoms like this previously. She denies any recent hospitalizations, surgeries, or previous blood clots She is fairly independent for the last couple of weeks she has been moving around in a wheelchair She undermines her symptoms and daughter states that she does the same when she was cardiology appointments. Denies a chest pain, fever, has a slight cough VQ scan was negative INR is subtherapeutic at 1.79,Chest xray shows bibasilar atelectasis and possible pneumonia with bilateral pleural effusions. Patient has afib and is currently in atrial  fibrillation rhythm but controlled rate. She is now oxygen dependent here in the ED and having difficulty breathing      Review of Systems: negative for the following  Constitutional: Denies fever, chills, diaphoresis, appetite change and fatigue.  HEENT: Denies photophobia, eye pain, redness, hearing loss, ear pain, congestion, sore throat, rhinorrhea, sneezing, mouth sores, trouble swallowing, neck pain, neck stiffness and tinnitus.  Respiratory: Positive for cough and shortness of breath.  Cardiovascular: Denies chest pain, palpitations and leg swelling.  Gastrointestinal: Denies nausea, vomiting, abdominal pain, diarrhea, constipation, blood in stool and abdominal distention.  Genitourinary: Denies dysuria, urgency, frequency, hematuria, flank pain and difficulty urinating.  Musculoskeletal: Denies myalgias, back pain, joint swelling, arthralgias and gait problem.  Skin: Denies pallor, rash and wound.  Neurological: Denies dizziness, seizures, syncope, weakness, light-headedness, numbness and headaches.  Hematological: Denies adenopathy. Easy bruising, personal or family bleeding history  Psychiatric/Behavioral: Denies suicidal ideation, mood changes, confusion, nervousness, sleep disturbance and agitation       Past Medical History  Diagnosis Date  . Hypertension   . Atrial fibrillation   . Syncope   . Drug therapy     coumadin  . Pneumonia     History of  . Bronchiectasis     History of     Past Surgical History  Procedure Laterality Date  . Knee surgery      Left knee aspiration and injection  . Tonsillectomy    . Cesarean section    . Leg surgery      Following a trauma  . Cataract extraction, bilateral    . Hip fracture surgery      Right      Social History:  reports  that she has never smoked. She has never used smokeless tobacco. She reports that she does not drink alcohol or use illicit drugs.    Allergies  Allergen Reactions  . Cephalexin Other  (See Comments)    "Causes an infection"  . Penicillins Swelling    Family History  Problem Relation Age of Onset  . Heart attack Mother 22  . Pneumonia Father      Prior to Admission medications   Medication Sig Start Date End Date Taking? Authorizing Provider  Bioflavonoid Products (VITAMIN C) CHEW Chew 2 tablets by mouth daily.   Yes Historical Provider, MD  cetirizine (ZYRTEC) 10 MG tablet Take 10 mg by mouth daily.   Yes Historical Provider, MD  digoxin (LANOXIN) 0.125 MG tablet Take 0.125 mg by mouth every 7 (seven) days. Take on Monday   Yes Historical Provider, MD  diltiazem (DILACOR XR) 240 MG 24 hr capsule Take 240 mg by mouth daily.   Yes Historical Provider, MD  Fluticasone Furoate-Vilanterol (BREO ELLIPTA) 200-25 MCG/INH AEPB Inhale 1 Inhaler into the lungs daily.    Yes Historical Provider, MD  losartan (COZAAR) 100 MG tablet Take 100 mg by mouth daily.   Yes Historical Provider, MD  Multiple Vitamin (MULTIVITAMIN) tablet Take 1 tablet by mouth daily.     Yes Historical Provider, MD  warfarin (COUMADIN) 5 MG tablet Take 2.5 mg by mouth See admin instructions. Take 2.5mg  daily except on Monday   Yes Historical Provider, MD     Physical Exam: Filed Vitals:   02/05/14 1052 02/05/14 1057 02/05/14 1602 02/05/14 1630  BP:   178/85 185/85  Pulse:   83 76  Temp:      TempSrc:      Resp:   20 26  SpO2: 92% 95% 91% 93%     Constitutional: Vital signs reviewed. Patient is a well-developed and well-nourished in no acute distress and cooperative with exam. Alert and oriented x3.  Head: Normocephalic and atraumatic  Ear: TM normal bilaterally  Mouth: no erythema or exudates, MMM  Eyes: PERRL, EOMI, conjunctivae normal, No scleral icterus.  Neck: Supple, Trachea midline normal ROM, No JVD, mass, thyromegaly, or carotid bruit present.  Cardiovascular: Irregularly irregular rhythm.  Pulmonary/Chest: Effort normal and breath sounds normal. She has no wheezes. She has no rales.  She exhibits no tenderness.  Abdominal: Soft. She exhibits no distension. There is no tenderness. There is no rebound and no guarding.  Musculoskeletal: Normal range of motion.  No calf tenderness to palpation or lower extremity swelling.  Neurological: She is alert and oriented to person, place, and time. Coordination normal.  Speech is goal-oriented. Moves limbs without ataxia Skin: Warm, dry and intact. No rash, cyanosis, or clubbing.  Psychiatric: Normal mood and affect. speech and behavior is normal. Judgment and thought content normal. Cognition and memory are normal.       Labs on Admission:    Basic Metabolic Panel:  Recent Labs Lab 02/05/14 1129  NA 139  K 4.5  CL 103  CO2 24  GLUCOSE 115*  BUN 30*  CREATININE 1.12*  CALCIUM 9.3   Liver Function Tests:  Recent Labs Lab 02/05/14 1129  AST 34  ALT 31  ALKPHOS 62  BILITOT 0.8  PROT 8.7*  ALBUMIN 3.7   No results found for this basename: LIPASE, AMYLASE,  in the last 168 hours No results found for this basename: AMMONIA,  in the last 168 hours CBC:  Recent Labs Lab 02/05/14 1129  WBC  7.0  NEUTROABS 5.4  HGB 14.0  HCT 43.1  MCV 96.6  PLT 179   Cardiac Enzymes: No results found for this basename: CKTOTAL, CKMB, CKMBINDEX, TROPONINI,  in the last 168 hours  BNP (last 3 results)  Recent Labs  02/05/14 1350  PROBNP 3066.0*      CBG: No results found for this basename: GLUCAP,  in the last 168 hours  Radiological Exams on Admission: Dg Chest 2 View  02/05/2014   CLINICAL DATA:  Shortness of breath.  Hypertension.  EXAM: CHEST  2 VIEW  COMPARISON:  06/27/2008.  FINDINGS: The heart remains grossly normal in size. No significant change in linear density and small amount of pleural fluid at the left lung base. Increased patchy density in pleural fluid at the right lung base. Stable mildly prominent interstitial markings. Diffuse osteopenia. Atheromatous arterial calcifications.  IMPRESSION: 1.  Bibasilar atelectasis and possible pneumonia with bilateral pleural effusions, greater on the right. 2. Stable chronic interstitial lung disease.   Electronically Signed   By: Gordan Payment M.D.   On: 02/05/2014 12:05   Nm Pulmonary Perf And Vent  02/05/2014   CLINICAL DATA:  Shortness of breath for 1 week. Bilateral airspace opacities and pleural effusions on recent radiographs.  EXAM: NUCLEAR MEDICINE VENTILATION - PERFUSION LUNG SCAN  TECHNIQUE: Ventilation images were obtained in multiple projections using inhaled aerosol technetium 99 M DTPA. Perfusion images were obtained in multiple projections after intravenous injection of Tc-64m MAA.  RADIOPHARMACEUTICALS:  40.1 mCi Tc-55m DTPA aerosol and 5.5 mCi Tc-87m MAA  COMPARISON:  Radiographs 06/27/2008 and 02/05/2014.  FINDINGS: Ventilation: There is decreased ventilation in both lung bases. There is central clumping of the aerosol within the tracheobronchial tree.  Perfusion: Decreased perfusion to both lung bases is matched to the ventilatory abnormalities and corresponds with bilateral pleural effusions. There are no segmental perfusion mismatches.  IMPRESSION: Low probability for acute pulmonary embolism. There are ventilation and perfusion defects in both lung bases corresponding with bilateral pleural effusions.   Electronically Signed   By: Roxy Horseman M.D.   On: 02/05/2014 15:30    EKG: Independently reviewed. Date: 02/05/2014  Rate: 85  Rhythm: atrial fibrillation  QRS Axis: normal  Intervals: normal  ST/T Wave abnormalities: nonspecific ST/T changes  Conduction Disutrbances:none  Narrative Interpretation: atrial fibrillation unchanged from previous  Old EKG Reviewed: unchanged   Assessment/Plan Active Problems:   CHF (congestive heart failure)   CHF (congestive heart failure), NYHA class II  Acute on chronic diastolic heart failure ProBNP elevated Admit patient to telemetry Repeat 2-D echo in the morning Cycle cardiac enzymes,  check TSH Chest x-ray shows possible pneumonia but the patient has not had any clinical symptoms of pneumonia Therefore we'll not treat as such She may need a cardiology consultation in the morning but await results of the 2-D echo Feels better after receiving Lasix in the ED Continue IV Lasix  Hypertension On Cozaar, Cardizem   Atrial fibrillation Subtherapeutic INR Coumadin per pharmacy protocol Rate controlled on digoxin and diltiazem   Code Status:   full Family Communication: bedside Disposition Plan: admit   Time spent: 70 mins   [facility] Pager [phone]  If 7PM-7AM, please contact night-coverage [email or web address] Password [facility] 02/05/2014, 5:26 PM

## 2014-02-06 DIAGNOSIS — I059 Rheumatic mitral valve disease, unspecified: Secondary | ICD-10-CM

## 2014-02-06 DIAGNOSIS — Q2111 Secundum atrial septal defect: Secondary | ICD-10-CM

## 2014-02-06 DIAGNOSIS — Q211 Atrial septal defect: Secondary | ICD-10-CM

## 2014-02-06 DIAGNOSIS — I10 Essential (primary) hypertension: Secondary | ICD-10-CM

## 2014-02-06 DIAGNOSIS — Z9981 Dependence on supplemental oxygen: Secondary | ICD-10-CM

## 2014-02-06 DIAGNOSIS — N183 Chronic kidney disease, stage 3 unspecified: Secondary | ICD-10-CM

## 2014-02-06 DIAGNOSIS — R55 Syncope and collapse: Secondary | ICD-10-CM

## 2014-02-06 DIAGNOSIS — I5033 Acute on chronic diastolic (congestive) heart failure: Principal | ICD-10-CM

## 2014-02-06 DIAGNOSIS — I4891 Unspecified atrial fibrillation: Secondary | ICD-10-CM

## 2014-02-06 LAB — CBC
HEMATOCRIT: 42.2 % (ref 36.0–46.0)
Hemoglobin: 14 g/dL (ref 12.0–15.0)
MCH: 32.2 pg (ref 26.0–34.0)
MCHC: 33.2 g/dL (ref 30.0–36.0)
MCV: 97 fL (ref 78.0–100.0)
Platelets: 145 10*3/uL — ABNORMAL LOW (ref 150–400)
RBC: 4.35 MIL/uL (ref 3.87–5.11)
RDW: 16.6 % — AB (ref 11.5–15.5)
WBC: 7.9 10*3/uL (ref 4.0–10.5)

## 2014-02-06 LAB — COMPREHENSIVE METABOLIC PANEL
ALBUMIN: 3.3 g/dL — AB (ref 3.5–5.2)
ALT: 25 U/L (ref 0–35)
AST: 25 U/L (ref 0–37)
Alkaline Phosphatase: 61 U/L (ref 39–117)
Anion gap: 12 (ref 5–15)
BILIRUBIN TOTAL: 0.8 mg/dL (ref 0.3–1.2)
BUN: 30 mg/dL — ABNORMAL HIGH (ref 6–23)
CHLORIDE: 100 meq/L (ref 96–112)
CO2: 29 meq/L (ref 19–32)
CREATININE: 1.31 mg/dL — AB (ref 0.50–1.10)
Calcium: 9.4 mg/dL (ref 8.4–10.5)
GFR calc Af Amer: 40 mL/min — ABNORMAL LOW (ref >90)
GFR, EST NON AFRICAN AMERICAN: 34 mL/min — AB (ref >90)
Glucose, Bld: 97 mg/dL (ref 70–99)
POTASSIUM: 4.2 meq/L (ref 3.7–5.3)
SODIUM: 141 meq/L (ref 137–147)
Total Protein: 8.2 g/dL (ref 6.0–8.3)

## 2014-02-06 LAB — TROPONIN I
Troponin I: <0.3 ng/mL (ref <0.30)
Troponin I: <0.3 ng/mL (ref <0.30)

## 2014-02-06 LAB — PROTIME-INR
INR: 2.12 — ABNORMAL HIGH (ref 0.00–1.49)
Prothrombin Time: 23.7 seconds — ABNORMAL HIGH (ref 11.6–15.2)

## 2014-02-06 MED ORDER — WARFARIN SODIUM 2.5 MG PO TABS
2.5000 mg | ORAL_TABLET | Freq: Once | ORAL | Status: AC
  Administered 2014-02-06: 2.5 mg via ORAL
  Filled 2014-02-06: qty 1

## 2014-02-06 MED ORDER — DILTIAZEM HCL ER 120 MG PO CP24
120.0000 mg | ORAL_CAPSULE | Freq: Every day | ORAL | Status: DC
  Administered 2014-02-07 – 2014-02-08 (×2): 120 mg via ORAL
  Filled 2014-02-06 (×2): qty 1

## 2014-02-06 MED ORDER — ENOXAPARIN SODIUM 30 MG/0.3ML ~~LOC~~ SOLN
30.0000 mg | SUBCUTANEOUS | Status: DC
  Administered 2014-02-06: 30 mg via SUBCUTANEOUS
  Filled 2014-02-06 (×2): qty 0.3

## 2014-02-06 MED ORDER — CARVEDILOL 3.125 MG PO TABS
3.1250 mg | ORAL_TABLET | Freq: Two times a day (BID) | ORAL | Status: DC
  Administered 2014-02-06 – 2014-02-07 (×2): 3.125 mg via ORAL
  Filled 2014-02-06 (×4): qty 1

## 2014-02-06 NOTE — Progress Notes (Signed)
Echocardiogram 2D Echocardiogram has been performed.  Candice Rangel 02/06/2014, 9:59 AM

## 2014-02-06 NOTE — Evaluation (Addendum)
Clinical/Bedside Swallow Evaluation Patient Details  Name: Yachet Mattson MRN: [id number] Date of Birth: 1920-10-15  Today's Date: 02/06/2014 Time: 1400-1420 SLP Time Calculation (min): 20 min  Past Medical History:  Past Medical History  Diagnosis Date  . Hypertension   . Atrial fibrillation   . Syncope   . Drug therapy     coumadin  . Pneumonia     History of  . Bronchiectasis     History of   Past Surgical History:  Past Surgical History  Procedure Laterality Date  . Knee surgery      Left knee aspiration and injection  . Tonsillectomy    . Cesarean section    . Leg surgery      Following a trauma  . Cataract extraction, bilateral    . Hip fracture surgery      Right   HPI:  80 yo female adm to [facility] with progressive SOB and decrease appetite-diagnosed with CHF.    Pt found to have right more than left ATX.  Pt PMH + for pna, HTN, Afib.  Swallow evaluation ordered. She denies reflux symptoms, states husband smoked when alive but not in the house.      Assessment / Plan / Recommendation Clinical Impression  Pt presents with functional oropharyngeal swallow ability based on clinical evaluation.  No focal CN deficit nor indication of dysphagia clinically.  She does admit to occasional difficulties with sensing food sticking in proximal esophagus.  She also stated rice "builds up" and causes issues.  SLP advised pt to compensation strategies to manage suspected age related dysphagia (ie. starting meals with liquids. following solids with room temperature liquids).   Pt states she gets full fast but denies significant dysphagia.      Aspiration Risk  Mild    Diet Recommendation Regular;Thin liquid   Liquid Administration via: Cup;Straw Medication Administration: Whole meds with liquid Supervision: Patient able to self feed Compensations: Slow rate;Small sips/bites (consume liquids throughout meal) Postural Changes and/or Swallow Maneuvers: Seated upright 90  degrees;Upright 30-60 min after meal    Other  Recommendations Oral Care Recommendations: Oral care BID   Follow Up Recommendations  None    Frequency and Duration        Pertinent Vitals/Pain Afebrile, decreased     Swallow Study Prior Functional Status   see HHX    General Date of Onset: 02/06/14 HPI: 80 yo female adm to [facility] with progressive SOB and decrease appetite-diagnosed with CHF.    Pt found to have right more than left ATX.  Pt PMH + for pna, HTN, Afib.  Swallow evaluation ordered.  Type of Study: Bedside swallow evaluation Diet Prior to this Study: Regular;Thin liquids Respiratory Status: Nasal cannula History of Recent Intubation: No Behavior/Cognition: Alert;Cooperative Oral Cavity - Dentition: Adequate natural dentition Self-Feeding Abilities: Able to feed self Patient Positioning: Upright in bed Baseline Vocal Quality: Clear Volitional Cough: Strong Volitional Swallow: Able to elicit    Oral/Motor/Sensory Function Overall Oral Motor/Sensory Function: Appears within functional limits for tasks assessed   Ice Chips Ice chips: Not tested   Thin Liquid Thin Liquid: Within functional limits Presentation: Self Fed;Cup    Nectar Thick Nectar Thick Liquid: Not tested   Honey Thick Honey Thick Liquid: Not tested   Puree Puree: Within functional limits Presentation: Self Fed   Solid   GO    Solid: Within functional limits Presentation: [address]       Mickie Bail Luis Llorons Torres, [place] [facility] [place] [phone]

## 2014-02-06 NOTE — Progress Notes (Addendum)
[facility] PROGRESS NOTE   [facility] ZOX:[id number] DOB: 1921/05/04 DOA: 02/05/2014 PCP: Pamelia Hoit, MD  HPI/Subjective: Feels much better, less SOB, no cough or sputum production.  Assessment/Plan: Principal Problem:   Acute on chronic diastolic CHF (congestive heart failure) Active Problems:   HYPERTENSION   Atrial fibrillation   CHF (congestive heart failure), NYHA class II   CKD (chronic kidney disease), stage III   Acute on chronic diastolic heart failure  Presented with shortness of breath, elevated proBNP and history of CHF 2-D echocardiogram showed ejection fraction of 63% but grade 3 diastolic dysfunction -3 sets of cardiac enzymes, TSH is normal. Chest x-ray shows possible pneumonia but the patient has not had any clinical symptoms of pneumonia, therefore we'll not treat as such.  Continue IV Lasix, digoxin, daily weight, strict intake and output, restrict fluid intake to 1.2 L/day. Patient is on losartan, hold because of concurrent diuresis and CKD stage III. Added Coreg at low dose and decrease the dose of Cardizem.  CKD stage III Presented with creatinine of 1.12, after diuresis start her creatinine increased to 1.3. Hold losartan, continue Lasix at lower dose for now as patient started to look more euvolemic.  Hypertension  Patient is on Cardizem, losartan hold losartan because of CKD stage III/diuresis and rising creatinine.   Atrial fibrillation  Presented with subtherapeutic INR of 1.79, INR is 2.1 today. Coumadin per pharmacy protocol  Rate controlled on digoxin and diltiazem. Added Coreg (for CHF) at low dose and decrease the dose of Cardizem.    Code Status: Full code Family Communication: Plan discussed with the patient. Disposition Plan: Remains inpatient   Consultants:  None  Procedures:  2-D echo showed ejection fraction of 63% in grade 3 diastolic dysfunction, Mildly elevated pulmonary artery pressure of 39  mmHg.   Antibiotics:  None.  Objective: Filed Vitals:   02/06/14 1419  BP: 135/52  Pulse: 67  Temp: 97.9 F (36.6 C)  Resp: 20    Intake/Output Summary (Last 24 hours) at 02/06/14 1532 Last data filed at 02/06/14 1428  Gross per 24 hour  Intake    243 ml  Output   2225 ml  Net  -1982 ml   Filed Weights   02/05/14 1743 02/06/14 0652  Weight: 51.9 kg (114 lb 6.7 oz) 49.17 kg (108 lb 6.4 oz)    Exam: General: Alert and awake, oriented x3, not in any acute distress. HEENT: anicteric sclera, pupils reactive to light and accommodation, EOMI CVS: S1-S2 clear, no murmur rubs or gallops Chest: clear to auscultation bilaterally, no wheezing, rales or rhonchi Abdomen: soft nontender, nondistended, normal bowel sounds, no organomegaly Extremities: no cyanosis, clubbing or edema noted bilaterally Neuro: Cranial nerves II-XII intact, no focal neurological deficits  Data Reviewed: Basic Metabolic Panel:  Recent Labs Lab 02/05/14 1129 02/05/14 1808 02/06/14 0615  NA 139  --  141  K 4.5  --  4.2  CL 103  --  100  CO2 24  --  29  GLUCOSE 115*  --  97  BUN 30*  --  30*  CREATININE 1.12* 1.30* 1.31*  CALCIUM 9.3  --  9.4   Liver Function Tests:  Recent Labs Lab 02/05/14 1129 02/06/14 0615  AST 34 25  ALT 31 25  ALKPHOS 62 61  BILITOT 0.8 0.8  PROT 8.7* 8.2  ALBUMIN 3.7 3.3*   No results found for this basename: LIPASE, AMYLASE,  in the last 168 hours No results found for this basename: AMMONIA,  in the last 168 hours CBC:  Recent Labs Lab 02/05/14 1129 02/05/14 1808 02/06/14 0615  WBC 7.0 9.1 7.9  NEUTROABS 5.4  --   --   HGB 14.0 14.0 14.0  HCT 43.1 42.4 42.2  MCV 96.6 97.2 97.0  PLT 179 179 145*   Cardiac Enzymes:  Recent Labs Lab 02/05/14 1808 02/05/14 2355 02/06/14 0615  TROPONINI <0.30 <0.30 <0.30   BNP (last 3 results)  Recent Labs  02/05/14 1350  PROBNP 3066.0*   CBG: No results found for this basename: GLUCAP,  in the last 168  hours  Micro No results found for this or any previous visit (from the past 240 hour(s)).   Studies: Dg Chest 2 View  02/05/2014   CLINICAL DATA:  Shortness of breath.  Hypertension.  EXAM: CHEST  2 VIEW  COMPARISON:  06/27/2008.  FINDINGS: The heart remains grossly normal in size. No significant change in linear density and small amount of pleural fluid at the left lung base. Increased patchy density in pleural fluid at the right lung base. Stable mildly prominent interstitial markings. Diffuse osteopenia. Atheromatous arterial calcifications.  IMPRESSION: 1. Bibasilar atelectasis and possible pneumonia with bilateral pleural effusions, greater on the right. 2. Stable chronic interstitial lung disease.   Electronically Signed   By: Gordan Payment M.D.   On: 02/05/2014 12:05   Nm Pulmonary Perf And Vent  02/05/2014   CLINICAL DATA:  Shortness of breath for 1 week. Bilateral airspace opacities and pleural effusions on recent radiographs.  EXAM: NUCLEAR MEDICINE VENTILATION - PERFUSION LUNG SCAN  TECHNIQUE: Ventilation images were obtained in multiple projections using inhaled aerosol technetium 99 M DTPA. Perfusion images were obtained in multiple projections after intravenous injection of Tc-81m MAA.  RADIOPHARMACEUTICALS:  40.1 mCi Tc-71m DTPA aerosol and 5.5 mCi Tc-59m MAA  COMPARISON:  Radiographs 06/27/2008 and 02/05/2014.  FINDINGS: Ventilation: There is decreased ventilation in both lung bases. There is central clumping of the aerosol within the tracheobronchial tree.  Perfusion: Decreased perfusion to both lung bases is matched to the ventilatory abnormalities and corresponds with bilateral pleural effusions. There are no segmental perfusion mismatches.  IMPRESSION: Low probability for acute pulmonary embolism. There are ventilation and perfusion defects in both lung bases corresponding with bilateral pleural effusions.   Electronically Signed   By: Roxy Horseman M.D.   On: 02/05/2014 15:30     Scheduled Meds: . [START ON 02/11/2014] digoxin  0.125 mg Oral Q7 days  . diltiazem  240 mg Oral Daily  . enoxaparin (LOVENOX) injection  30 mg Subcutaneous Q24H  . furosemide  40 mg Intravenous Q12H  . losartan  50 mg Oral Daily  . sodium chloride  3 mL Intravenous Q12H  . warfarin  2.5 mg Oral ONCE-1800  . Warfarin - Pharmacist Dosing Inpatient   Does not apply q1800   Continuous Infusions:      Time spent: 35 minutes    [facility]  [facility] Pager [phone] If 7PM-7AM, please contact night-coverage at [email or web address], password [facility] 02/06/2014, 3:32 PM  LOS: 1 day

## 2014-02-06 NOTE — Progress Notes (Signed)
ANTICOAGULATION CONSULT NOTE - Initial Consult  Pharmacy Consult for Coumadin Indication: atrial fibrillation  Allergies  Allergen Reactions  . Cephalexin Other (See Comments)    "Causes an infection"  . Penicillins Swelling    Patient Measurements: Height:  (157.5 cm) Weight: 108 lb 6.4 oz (49.17 kg) IBW/kg (Calculated) : 50.1  Vital Signs: Temp: 97.5 F (36.4 C) (09/22 0652) Temp src: Oral (09/22 0652) BP: 144/76 mmHg (09/22 0652) Pulse Rate: 76 (09/22 0652)  Labs:  Recent Labs  02/05/14 1129 02/05/14 1808 02/05/14 2355 02/06/14 0615  HGB 14.0 14.0  --  14.0  HCT 43.1 42.4  --  42.2  PLT 179 179  --  145*  LABPROT 20.8*  --   --  23.7*  INR 1.79*  --   --  2.12*  CREATININE 1.12* 1.30*  --  1.31*  TROPONINI  --  <0.30 <0.30 <0.30    Estimated Creatinine Clearance: 21.7 ml/min (by C-G formula based on Cr of 1.31).   Medical History: Past Medical History  Diagnosis Date  . Hypertension   . Atrial fibrillation   . Syncope   . Drug therapy     coumadin  . Pneumonia     History of  . Bronchiectasis     History of    Medications:  Prescriptions prior to admission  Medication Sig Dispense Refill  . Bioflavonoid Products (VITAMIN C) CHEW Chew 2 tablets by mouth daily.      . cetirizine (ZYRTEC) 10 MG tablet Take 10 mg by mouth daily.      . digoxin (LANOXIN) 0.125 MG tablet Take 0.125 mg by mouth every 7 (seven) days. Take on Monday      . diltiazem (DILACOR XR) 240 MG 24 hr capsule Take 240 mg by mouth daily.      . Fluticasone Furoate-Vilanterol (BREO ELLIPTA) 200-25 MCG/INH AEPB Inhale 1 Inhaler into the lungs daily.       Marland Kitchen losartan (COZAAR) 100 MG tablet Take 100 mg by mouth daily.      . Multiple Vitamin (MULTIVITAMIN) tablet Take 1 tablet by mouth daily.        Marland Kitchen warfarin (COUMADIN) 5 MG tablet Take 2.5 mg by mouth See admin instructions. Take 2.5mg  daily except on Monday       Scheduled:  . [START ON 02/11/2014] digoxin  0.125 mg Oral Q7  days  . diltiazem  240 mg Oral Daily  . enoxaparin (LOVENOX) injection  30 mg Subcutaneous Q24H  . furosemide  40 mg Intravenous Q12H  . losartan  50 mg Oral Daily  . sodium chloride  3 mL Intravenous Q12H  . Warfarin - Pharmacist Dosing Inpatient   Does not apply q1800    Assessment: 80yo F admitted with progressively worsening SOB x 2 weeks. Recently took a bus trip to [place] but denies any leg swelling or chest pain. Pharmacy is asked to monitor her Coumadin for A.fib. Home dose is 2.5mg  daily except nothing on Mondays.   INR is therapeutic today 2.12  CBC is wnl.  SCr 1.31, CrCl ~31N.  MD has ordered prophylactic-dose Lovenox as part of admission order set.  Goal of Therapy:  INR 2-3 Monitor platelets by anticoagulation protocol: Yes   Plan:   Start patient back on home regimen of warfarin 2.5mg  po x 1 today  Recommend stopping Lovenox tomorrow if INR remains therapeutic  F/u daily.  Loma Boston, PharmD Pager: [phone] 02/06/2014 10:18 AM

## 2014-02-06 NOTE — Progress Notes (Signed)
INITIAL NUTRITION ASSESSMENT  DOCUMENTATION CODES Per approved criteria  -Not Applicable   INTERVENTION: -Encouraged intake of balanced meals -Continue with liberalized diet  -Assisted in meal ordering; modified pt to Room Service Assist -Offered nutritional supplements; consider Ensure if PO intake < 75% -RD to continue to monitor  NUTRITION DIAGNOSIS: Inadequate oral intake related to decreased appetite as evidenced by per pt report.   Goal: Pt to meet >/= 90% of their estimated nutrition needs    Monitor:  Total protein/energy intake, labs, weights, supplement needs  Reason for Assessment: MST  80 y.o. female  Admitting Dx: <principal problem not specified>  ASSESSMENT: Patient presents today with worsening dyspnea on exertion for the last couple of weeks. Initially her shortness of breath was only when walking to the mailbox. Now it is progressively getting worse with dyspnea on exertion with ambulation inside the house. She also feels that she would pass out early the morning and she wakes up. Patient was found to be hypoxic with SaO2 of 87% upon presentation  -Pt reported one year of decreased appetite. Denied nausea, vomiting, or abd pain. Endorsed early satiety -Has lost weight, reported usual body weight of 114 lbs (5% body weight loss, non-severe for time frame) -Diet recall indicates pt consumes two meals daily. Does not use nutrition supplements -Assisted pt in ordering breakfast and lunch meals. Recommend to continue with liberalized diet d/t advanced age, and hx of poor appetite -Will modify pt to receive Room Service Assist to encourage PO intake -SLP consult pending; however pt appeared be tolerating regular diet textures per types of foods pt selected for meals -Pt declined supplements. MD noted pt does undermine symptoms; indicating pt may benefit from supplementation if PO intake  Decreases to <75%   Height: Ht Readings from Last 1 Encounters:  02/05/14 5'  2" (1.575 m)    Weight: Wt Readings from Last 1 Encounters:  02/06/14 108 lb 6.4 oz (49.17 kg)    Ideal Body Weight: 110 lbs  % Ideal Body Weight: 98 lbs  Wt Readings from Last 10 Encounters:  02/06/14 108 lb 6.4 oz (49.17 kg)  01/03/14 116 lb 12.8 oz (52.98 kg)  05/25/12 118 lb (53.524 kg)  05/07/11 113 lb (51.256 kg)  05/07/10 112 lb 12 oz (51.143 kg)  03/27/09 117 lb (53.071 kg)    Usual Body Weight: 114 lbs  % Usual Body Weight: 95%  BMI:  Body mass index is 19.82 kg/(m^2).  Estimated Nutritional Needs: Kcal: 1300-1500 Protein: 60-70 gram Fluid: >/=1300 ml/daily  Skin: WDL  Diet Order: General  EDUCATION NEEDS: -Education not appropriate at this time   Intake/Output Summary (Last 24 hours) at 02/06/14 1102 Last data filed at 02/06/14 1056  Gross per 24 hour  Intake    243 ml  Output   1975 ml  Net  -1732 ml    Last BM: 9/21   Labs:   Recent Labs Lab 02/05/14 1129 02/05/14 1808 02/06/14 0615  NA 139  --  141  K 4.5  --  4.2  CL 103  --  100  CO2 24  --  29  BUN 30*  --  30*  CREATININE 1.12* 1.30* 1.31*  CALCIUM 9.3  --  9.4  GLUCOSE 115*  --  97    CBG (last 3)  No results found for this basename: GLUCAP,  in the last 72 hours  Scheduled Meds: . [START ON 02/11/2014] digoxin  0.125 mg Oral Q7 days  . diltiazem  240 mg Oral Daily  . enoxaparin (LOVENOX) injection  30 mg Subcutaneous Q24H  . furosemide  40 mg Intravenous Q12H  . losartan  50 mg Oral Daily  . sodium chloride  3 mL Intravenous Q12H  . warfarin  2.5 mg Oral ONCE-1800  . Warfarin - Pharmacist Dosing Inpatient   Does not apply q1800    Continuous Infusions:   Past Medical History  Diagnosis Date  . Hypertension   . Atrial fibrillation   . Syncope   . Drug therapy     coumadin  . Pneumonia     History of  . Bronchiectasis     History of    Past Surgical History  Procedure Laterality Date  . Knee surgery      Left knee aspiration and injection  .  Tonsillectomy    . Cesarean section    . Leg surgery      Following a trauma  . Cataract extraction, bilateral    . Hip fracture surgery      Right    Lloyd Huger MS RD LDN Clinical Dietitian [phone]

## 2014-02-07 LAB — PRO B NATRIURETIC PEPTIDE: Pro B Natriuretic peptide (BNP): 2980 pg/mL — ABNORMAL HIGH (ref 0–450)

## 2014-02-07 LAB — BASIC METABOLIC PANEL
Anion gap: 10 (ref 5–15)
BUN: 45 mg/dL — AB (ref 6–23)
CALCIUM: 9.1 mg/dL (ref 8.4–10.5)
CO2: 31 meq/L (ref 19–32)
Chloride: 100 mEq/L (ref 96–112)
Creatinine, Ser: 1.68 mg/dL — ABNORMAL HIGH (ref 0.50–1.10)
GFR calc Af Amer: 30 mL/min — ABNORMAL LOW (ref >90)
GFR calc non Af Amer: 26 mL/min — ABNORMAL LOW (ref >90)
GLUCOSE: 105 mg/dL — AB (ref 70–99)
Potassium: 4.4 mEq/L (ref 3.7–5.3)
SODIUM: 141 meq/L (ref 137–147)

## 2014-02-07 LAB — PROTIME-INR
INR: 3.23 — ABNORMAL HIGH (ref 0.00–1.49)
Prothrombin Time: 33 seconds — ABNORMAL HIGH (ref 11.6–15.2)

## 2014-02-07 MED ORDER — FUROSEMIDE 40 MG PO TABS
40.0000 mg | ORAL_TABLET | Freq: Every day | ORAL | Status: DC
Start: 2014-02-07 — End: 2014-02-07
  Filled 2014-02-07: qty 1

## 2014-02-07 NOTE — Progress Notes (Addendum)
ANTICOAGULATION CONSULT NOTE - Follow-up  Pharmacy Consult for Coumadin Indication: atrial fibrillation  Allergies  Allergen Reactions  . Cephalexin Other (See Comments)    "Causes an infection"  . Penicillins Swelling    Patient Measurements: Height:  (157.5 cm) Weight: 104 lb 4.4 oz (47.3 kg) IBW/kg (Calculated) : 50.1  Vital Signs: Temp: 98 F (36.7 C) (09/23 0621) Temp src: Oral (09/23 0621) BP: 126/75 mmHg (09/23 0621) Pulse Rate: 77 (09/23 0621)  Labs:  Recent Labs  02/05/14 1129 02/05/14 1808 02/05/14 2355 02/06/14 0615 02/07/14 0350  HGB 14.0 14.0  --  14.0  --   HCT 43.1 42.4  --  42.2  --   PLT 179 179  --  145*  --   LABPROT 20.8*  --   --  23.7* 33.0*  INR 1.79*  --   --  2.12* 3.23*  CREATININE 1.12* 1.30*  --  1.31* 1.68*  TROPONINI  --  <0.30 <0.30 <0.30  --     Estimated Creatinine Clearance: 16.3 ml/min (by C-G formula based on Cr of 1.68).   Medical History: Past Medical History  Diagnosis Date  . Hypertension   . Atrial fibrillation   . Syncope   . Drug therapy     coumadin  . Pneumonia     History of  . Bronchiectasis     History of    Medications:  Prescriptions prior to admission  Medication Sig Dispense Refill  . Bioflavonoid Products (VITAMIN C) CHEW Chew 2 tablets by mouth daily.      . cetirizine (ZYRTEC) 10 MG tablet Take 10 mg by mouth daily.      . digoxin (LANOXIN) 0.125 MG tablet Take 0.125 mg by mouth every 7 (seven) days. Take on Monday      . diltiazem (DILACOR XR) 240 MG 24 hr capsule Take 240 mg by mouth daily.      . Fluticasone Furoate-Vilanterol (BREO ELLIPTA) 200-25 MCG/INH AEPB Inhale 1 Inhaler into the lungs daily.       Marland Kitchen losartan (COZAAR) 100 MG tablet Take 100 mg by mouth daily.      . Multiple Vitamin (MULTIVITAMIN) tablet Take 1 tablet by mouth daily.        Marland Kitchen warfarin (COUMADIN) 5 MG tablet Take 2.5 mg by mouth See admin instructions. Take 2.5mg  daily except on Monday       Scheduled:  .  carvedilol  3.125 mg Oral BID WC  . [START ON 02/11/2014] digoxin  0.125 mg Oral Q7 days  . diltiazem  120 mg Oral Daily  . furosemide  40 mg Oral Daily  . sodium chloride  3 mL Intravenous Q12H  . Warfarin - Pharmacist Dosing Inpatient   Does not apply q1800    Assessment: 80yo F admitted with progressively worsening SOB x 2 weeks. Recently took a bus trip to [place] but denies any leg swelling or chest pain. Pharmacy is asked to monitor her Coumadin for atrial fibrillation. Home dose is 2.5mg  daily except nothing on Mondays.  Inpatient warfarin doses: 9/21:  Warfarin 4 mg 9/22:  Warfarin 2.5 mg  Today 9/23:   INR supratherapeutic today at 3.23.  Last CBC (9/22) WNL  SCr 1.68, CrCl ~25 N.  Drug Interactions:  None  Diet:  Regular  Goal of Therapy:  INR 2-3 Monitor platelets by anticoagulation protocol: Yes   Plan:   Hold warfarin today.   D/C Lovenox given supratherapeutic INR (discussed with MD).  Monitor for signs and  sx of bleeding.  Follow daily INR.   Greer Pickerel, PharmD, BCPS Pager: [phone] 02/07/2014 8:07 AM

## 2014-02-07 NOTE — Progress Notes (Signed)
SATURATION QUALIFICATIONS: (This note is used to comply with regulatory documentation for home oxygen)  Patient Saturations on Room Air at Rest = 88%  Patient Saturations on Room Air while Ambulating = N/A  Patient Saturations on 2 Liters of oxygen while Ambulating = 95%  Please briefly explain why patient needs home oxygen:

## 2014-02-07 NOTE — Progress Notes (Addendum)
[facility] PROGRESS NOTRonica Rangel ZOX:[id number] DOB: 07-30-1920 DOA: 02/05/2014 PCP: Candice Hoit, MD  Assessment/Plan  Acute on chronic diastolic heart failure with acute on respiratory failure, breathing markedly improved, creatinine rising - 2-D echocardiogram showed ejection fraction of 63% but grade 3 diastolic dysfunction  - 3 sets of cardiac enzymes, TSH is normal.  -  Chest x-ray showed possible pneumonia but patient without sx, afebrile, so monitoring  -  IV lasix once daily today (already received 6am dose) -  Transition to oral lasix tomorrow -  Continue digoxin -  Added Coreg at low dose and decrease the dose of Cardizem.  -  Wean to room air today  CKD stage III, creatinine trended up to 1.68 today -  Decrease diuresis and repeat creatinine in AM -  Continue to hold losartan  Hypertension with low BPs today -  Continue to hold losartan -  D/c carvedilol -  Continue reduced cardizem  Atrial fibrillation, rate controlled with bradycardia to 40s -  Warfarin per pharmacy, INR supratherapeutic -  Continue reduced dose dilt and digoxin -  D/c carvedilol   Diet:  regular Access:  PIV IVF:  off Proph:  warfarin  Code Status: full Family Communication: patient alone Disposition Plan: possibly tomorrow.  PT eval pending   Consultants:  None  Procedures:  9/21 CXR with bibasilar atelectasis vs. Pneumonia with bilateral pleural effusions, chronic stable interstitial lung disease  9/21 VQ low probability for PE  Antibiotics:  none   HPI/Subjective:  Breathing is much better since admission.  Legs are less swollen.  Denies chest pain.     Objective: Filed Vitals:   02/07/14 0621 02/07/14 1114 02/07/14 1122 02/07/14 1400  BP: 126/75   102/52  Pulse: 77   91  Temp: 98 F (36.7 C)   97.8 F (36.6 C)  TempSrc: Oral   Oral  Resp: 18   18  Height:      Weight: 47.3 kg (104 lb 4.4 oz)     SpO2: 94% 88% 95% 97%    Intake/Output  Summary (Last 24 hours) at 02/07/14 1708 Last data filed at 02/07/14 1149  Gross per 24 hour  Intake      0 ml  Output   1000 ml  Net  -1000 ml   Filed Weights   02/05/14 1743 02/06/14 0652 02/07/14 0621  Weight: 51.9 kg (114 lb 6.7 oz) 49.17 kg (108 lb 6.4 oz) 47.3 kg (104 lb 4.4 oz)    Exam:   General:  WF, No acute distress  HEENT:  NCAT, MMM  Cardiovascular:  IRRR, nl S1, S2 no mrg, 2+ pulses, warm extremities  Respiratory:  Rales to bilateral mid-back with pops and wheezes, no rhonchi, no increased WOB  Abdomen:   NABS, soft, NT/ND  MSK:   Normal tone and bulk, no LEE  Neuro:  Grossly intact  Data Reviewed: Basic Metabolic Panel:  Recent Labs Lab 02/05/14 1129 02/05/14 1808 02/06/14 0615 02/07/14 0350  NA 139  --  141 141  K 4.5  --  4.2 4.4  CL 103  --  100 100  CO2 24  --  29 31  GLUCOSE 115*  --  97 105*  BUN 30*  --  30* 45*  CREATININE 1.12* 1.30* 1.31* 1.68*  CALCIUM 9.3  --  9.4 9.1   Liver Function Tests:  Recent Labs Lab 02/05/14 1129 02/06/14 0615  AST 34 25  ALT 31 25  ALKPHOS 62 61  BILITOT 0.8 0.8  PROT 8.7* 8.2  ALBUMIN 3.7 3.3*   No results found for this basename: LIPASE, AMYLASE,  in the last 168 hours No results found for this basename: AMMONIA,  in the last 168 hours CBC:  Recent Labs Lab 02/05/14 1129 02/05/14 1808 02/06/14 0615  WBC 7.0 9.1 7.9  NEUTROABS 5.4  --   --   HGB 14.0 14.0 14.0  HCT 43.1 42.4 42.2  MCV 96.6 97.2 97.0  PLT 179 179 145*   Cardiac Enzymes:  Recent Labs Lab 02/05/14 1808 02/05/14 2355 02/06/14 0615  TROPONINI <0.30 <0.30 <0.30   BNP (last 3 results)  Recent Labs  02/05/14 1350 02/07/14 0350  PROBNP 3066.0* 2980.0*   CBG: No results found for this basename: GLUCAP,  in the last 168 hours  No results found for this or any previous visit (from the past 240 hour(s)).   Studies: No results found.  Scheduled Meds: . carvedilol  3.125 mg Oral BID WC  . [START ON 02/11/2014]  digoxin  0.125 mg Oral Q7 days  . diltiazem  120 mg Oral Daily  . furosemide  40 mg Oral Daily  . sodium chloride  3 mL Intravenous Q12H  . Warfarin - Pharmacist Dosing Inpatient   Does not apply q1800   Continuous Infusions:   Principal Problem:   Acute on chronic diastolic CHF (congestive heart failure) Active Problems:   HYPERTENSION   Atrial fibrillation   CHF (congestive heart failure), NYHA class II   CKD (chronic kidney disease), stage III    Time spent: 30 min    Candice Rangel  [facility] Pager [phone]. If 7PM-7AM, please contact night-coverage at [email or web address], password [facility] 02/07/2014, 5:08 PM  LOS: 2 days             \

## 2014-02-08 DIAGNOSIS — J9611 Chronic respiratory failure with hypoxia: Secondary | ICD-10-CM

## 2014-02-08 DIAGNOSIS — J849 Interstitial pulmonary disease, unspecified: Secondary | ICD-10-CM

## 2014-02-08 DIAGNOSIS — J8410 Pulmonary fibrosis, unspecified: Secondary | ICD-10-CM

## 2014-02-08 DIAGNOSIS — I5031 Acute diastolic (congestive) heart failure: Secondary | ICD-10-CM

## 2014-02-08 LAB — BASIC METABOLIC PANEL
Anion gap: 8 (ref 5–15)
BUN: 41 mg/dL — ABNORMAL HIGH (ref 6–23)
CHLORIDE: 98 meq/L (ref 96–112)
CO2: 34 mEq/L — ABNORMAL HIGH (ref 19–32)
CREATININE: 1.54 mg/dL — AB (ref 0.50–1.10)
Calcium: 8.8 mg/dL (ref 8.4–10.5)
GFR calc non Af Amer: 28 mL/min — ABNORMAL LOW (ref >90)
GFR, EST AFRICAN AMERICAN: 33 mL/min — AB (ref >90)
Glucose, Bld: 98 mg/dL (ref 70–99)
POTASSIUM: 4.2 meq/L (ref 3.7–5.3)
Sodium: 140 mEq/L (ref 137–147)

## 2014-02-08 LAB — PROTIME-INR
INR: 3.65 — ABNORMAL HIGH (ref 0.00–1.49)
PROTHROMBIN TIME: 36.3 s — AB (ref 11.6–15.2)

## 2014-02-08 MED ORDER — DILTIAZEM HCL ER 120 MG PO CP24: 120.0000 mg | ORAL_CAPSULE | Freq: Every day | ORAL | Status: AC

## 2014-02-08 MED ORDER — FUROSEMIDE 40 MG PO TABS: 40.0000 mg | ORAL_TABLET | Freq: Every day | ORAL | Status: DC

## 2014-02-08 NOTE — Progress Notes (Signed)
Patient discharged home with daughter, discharge instructions given and explained to patient/daughter and they verbalized understanding. Denies any pain/distress. No wound noted. Accompanied home by daughter, transported to the car by staff via wheelchair.

## 2014-02-08 NOTE — Discharge Summary (Signed)
Physician Discharge Summary  [place] ZOX:[id number] DOB: 1921-03-06 DOA: 02/05/2014  PCP: Pamelia Hoit, MD  Admit date: 02/05/2014 Discharge date: 02/08/2014  Recommendations for Outpatient Follow-up:  1. PCP in 1 week for weight check, check BMP to evaluate potassium and creatinine.   2. HH PT, OT, RN arranged 3. Started on home oxygen 2L with exertion 4. Rolling walker 5. INR 3.65 at time of discharge and should be rechecked tomorrow, results sent to PCP's office.    Discharge Diagnoses:  Principal Problem:   Acute on chronic diastolic CHF (congestive heart failure) Active Problems:   HYPERTENSION   Atrial fibrillation   CHF (congestive heart failure), NYHA class II   CKD (chronic kidney disease), stage III   Interstitial lung disease   Discharge Condition: stable, improved  Diet recommendation: low salt (counseled on 2gm sodium diet)    Wt Readings from Last 3 Encounters:  02/07/14 47.3 kg (104 lb 4.4 oz)  01/03/14 52.98 kg (116 lb 12.8 oz)  05/25/12 53.524 kg (118 lb)    History of present illness:  80 year old female with a history of atrial fibrillation, Syncope 2009 with a Myoview on September 15, 2007 which was negative for ischemia or infarction, 2-D echo in 1/14 showed EF of 78%, chronic a-fib on warfarin, who presented with worsening dyspnea on exertion for the last couple of weeks and lower extremity swelling.  Patient was found to be hypoxic with SaO2 of 87% upon presentation.  VQ scan was negative.  Chest xray showed chronic interstitial lung disease and bibasilar atelectasis with bilateral pleural effusions. She was admitted for presumptive heart failure exacerbation.    Hospital Course:   Acute on chronic diastolic heart failure with now chronic respiratory failure, breathing markedly improved with lasix  IV BID.  Her creatinine rose to 1.68 so her lasix was changed to oral lasix which she should continue at home.  Her creatinine trended down to 1.54.   2-D echocardiogram showed ejection fraction of 63% but grade 3 diastolic dysfunction.  Troponins were negative, telemetry demonstrated a-fib with PVCs.  TSH was wnl.  Her oxygen levels on room air improved with diuresis, however, she continued to have low oxygen saturations with exertion so she has been set up for home oxygen.  Some of her chronic respiratory failure may be due to heart failure but she may also have some interstitial lung disease.  She will need repeat BMP in 1 week.  Advised low sodium diet and daily weights.    CKD stage III, creatinine trended up to 1.68 with diuresis but is back to 1.54.  Recommend continuing to hold ARB for at least 2 weeks and if creatinine back to normal, may be okay to resume cautiously.    Hypertension with low BPs probably due to recent diuresis.  Hold losartan and her cardizem dose was reduced.  May be resumed if her blood pressure and HR are higher at follow up.    Atrial fibrillation, rate controlled with bradycardia to 40s despite lower dose of cardizem.  Warfarin per pharmacy, INR supratherapeutic after redosing warfarin for low INR.  Continue reduced dose dilt and  Digoxin but hold warfarin again tonight and INR check tomorrow.    Inpatient warfarin doses:  9/21: Warfarin 4 mg  9/22: Warfarin 2.5 mg  9/23: Warfarin held   Consultants:  None Procedures:  9/21 CXR with bibasilar atelectasis vs. Pneumonia with bilateral pleural effusions, chronic stable interstitial lung disease  9/21 VQ low probability for PE Antibiotics:  none   Discharge Exam: Filed Vitals:   02/08/14 1020  BP: 124/67  Pulse:   Temp:   Resp:    Filed Vitals:   02/07/14 1400 02/07/14 2210 02/08/14 0608 02/08/14 1020  BP: 102/52 128/68 129/61 124/67  Pulse: 91 63 64   Temp: 97.8 F (36.6 C) 98.6 F (37 C) 98 F (36.7 C)   TempSrc: Oral Oral Oral   Resp: Height:      Weight:      SpO2: 97% 100% 97%     General: WF, No acute distress  HEENT: NCAT,  MMM  Cardiovascular: IRRR, nl S1, S2 no mrg, 2+ pulses, warm extremities  Respiratory: Course rales at bases, no wheezes, no rhonchi, no increased WOB  Abdomen: NABS, soft, NT/ND  MSK: Normal tone and bulk, no LEE  Neuro: Grossly intact   Discharge Instructions      Discharge Instructions   (HEART FAILURE PATIENTS) Call MD:  Anytime you have any of the following symptoms: 1) 3 pound weight gain in 24 hours or 5 pounds in 1 week 2) shortness of breath, with or without a dry hacking cough 3) swelling in the hands, feet or stomach 4) if you have to sleep on extra pillows at night in order to breathe.    Complete by:  As directed      Call MD for:  difficulty breathing, headache or visual disturbances    Complete by:  As directed      Call MD for:  extreme fatigue    Complete by:  As directed      Call MD for:  hives    Complete by:  As directed      Call MD for:  persistant dizziness or light-headedness    Complete by:  As directed      Call MD for:  persistant nausea and vomiting    Complete by:  As directed      Call MD for:  severe uncontrolled pain    Complete by:  As directed      Call MD for:  temperature >100.4    Complete by:  As directed      Diet - low sodium heart healthy    Complete by:  As directed      Discharge instructions    Complete by:  As directed   You were hospitalized with shortness of breath and were found to have heart failure and lung disease.  You were given lasix with improvement in your swelling and your breathing.  Please eat a low salt diet (less than  of sodium per day).  Weigh yourself every morning and record the numbers in a journal.  If you gain more than 3-lbs in one day or 5-lbs in one week, call your primary care doctor.  Please take lasix every day unless you develop vomiting, diarrhea, or dehydration.  You will need bloodwork and an exam done in 1 week by your primary care doctor.  Please do NOT take your warfarin tonight and have the home  health nurse check an INR tomorrow for you since your INR was a little high today (3.6).     Increase activity slowly    Complete by:  As directed             Medication List    STOP taking these medications       losartan 100 MG tablet  Commonly known as:  COZAAR  warfarin 5 MG tablet  Commonly known as:  COUMADIN      TAKE these medications       BREO ELLIPTA 200-25 MCG/INH Aepb  Generic drug:  Fluticasone Furoate-Vilanterol  Inhale 1 Inhaler into the lungs daily.     cetirizine 10 MG tablet  Commonly known as:  ZYRTEC  Take 10 mg by mouth daily.     digoxin 0.125 MG tablet  Commonly known as:  LANOXIN  Take 0.125 mg by mouth every 7 (seven) days. Take on Monday     diltiazem 120 MG 24 hr capsule  Commonly known as:  DILACOR XR  Take 1 capsule (120 mg total) by mouth daily.     furosemide 40 MG tablet  Commonly known as:  LASIX  Take 1 tablet (40 mg total) by mouth daily.     multivitamin tablet  Take 1 tablet by mouth daily.     Vitamin C Chew  Chew 2 tablets by mouth daily.       Follow-up Information   Follow up with Pamelia Hoit, MD. Schedule an appointment as soon as possible for a visit in 1 week.   Specialty:  Family Medicine   Contact information:   [address] [place] [address] [phone]        The results of significant diagnostics from this hospitalization (including imaging, microbiology, ancillary and laboratory) are listed below for reference.    Significant Diagnostic Studies: Dg Chest 2 View  02/05/2014   CLINICAL DATA:  Shortness of breath.  Hypertension.  EXAM: CHEST  2 VIEW  COMPARISON:  06/27/2008.  FINDINGS: The heart remains grossly normal in size. No significant change in linear density and small amount of pleural fluid at the left lung base. Increased patchy density in pleural fluid at the right lung base. Stable mildly prominent interstitial markings. Diffuse osteopenia. Atheromatous arterial  calcifications.  IMPRESSION: 1. Bibasilar atelectasis and possible pneumonia with bilateral pleural effusions, greater on the right. 2. Stable chronic interstitial lung disease.   Electronically Signed   By: Gordan Payment M.D.   On: 02/05/2014 12:05   Nm Pulmonary Perf And Vent  02/05/2014   CLINICAL DATA:  Shortness of breath for 1 week. Bilateral airspace opacities and pleural effusions on recent radiographs.  EXAM: NUCLEAR MEDICINE VENTILATION - PERFUSION LUNG SCAN  TECHNIQUE: Ventilation images were obtained in multiple projections using inhaled aerosol technetium 99 M DTPA. Perfusion images were obtained in multiple projections after intravenous injection of Tc-56m MAA.  RADIOPHARMACEUTICALS:  40.1 mCi Tc-61m DTPA aerosol and 5.5 mCi Tc-50m MAA  COMPARISON:  Radiographs 06/27/2008 and 02/05/2014.  FINDINGS: Ventilation: There is decreased ventilation in both lung bases. There is central clumping of the aerosol within the tracheobronchial tree.  Perfusion: Decreased perfusion to both lung bases is matched to the ventilatory abnormalities and corresponds with bilateral pleural effusions. There are no segmental perfusion mismatches.  IMPRESSION: Low probability for acute pulmonary embolism. There are ventilation and perfusion defects in both lung bases corresponding with bilateral pleural effusions.   Electronically Signed   By: Roxy Horseman M.D.   On: 02/05/2014 15:30    Microbiology: No results found for this or any previous visit (from the past 240 hour(s)).   Labs: Basic Metabolic Panel:  Recent Labs Lab 02/05/14 1129 02/05/14 1808 02/06/14 0615 02/07/14 0350 02/08/14 0423  NA 139  --  141 141 140  K 4.5  --  4.2 4.4 4.2  CL 103  --  100  100 98  CO2 24  --  29 31 34*  GLUCOSE 115*  --  97 105* 98  BUN 30*  --  30* 45* 41*  CREATININE 1.12* 1.30* 1.31* 1.68* 1.54*  CALCIUM 9.3  --  9.4 9.1 8.8   Liver Function Tests:  Recent Labs Lab 02/05/14 1129 02/06/14 0615  AST 34 25  ALT  31 25  ALKPHOS 62 61  BILITOT 0.8 0.8  PROT 8.7* 8.2  ALBUMIN 3.7 3.3*   No results found for this basename: LIPASE, AMYLASE,  in the last 168 hours No results found for this basename: AMMONIA,  in the last 168 hours CBC:  Recent Labs Lab 02/05/14 1129 02/05/14 1808 02/06/14 0615  WBC 7.0 9.1 7.9  NEUTROABS 5.4  --   --   HGB 14.0 14.0 14.0  HCT 43.1 42.4 42.2  MCV 96.6 97.2 97.0  PLT 179 179 145*   Cardiac Enzymes:  Recent Labs Lab 02/05/14 1808 02/05/14 2355 02/06/14 0615  TROPONINI <0.30 <0.30 <0.30   BNP: BNP (last 3 results)  Recent Labs  02/05/14 1350 02/07/14 0350  PROBNP 3066.0* 2980.0*   CBG: No results found for this basename: GLUCAP,  in the last 168 hours  Time coordinating discharge: 45 minutes  Signed:  Cyncere Sontag  [facility] 02/08/2014, 12:50 PM

## 2014-02-08 NOTE — Evaluation (Signed)
Physical Therapy Evaluation Patient Details Name: Mayli Covington MRN: [id number] DOB: [birth year] Today's Date: 02/08/2014   History of Present Illness  80 year old female with a history of atrial fibrillation, Syncope, hypertension, pneumonia admitted with SOB and diagnosed with CHF.  Clinical Impression  Pt admitted with CHF. Pt currently with functional limitations due to the deficits listed below (see PT Problem List).  Pt will benefit from skilled PT to increase their independence and safety with mobility to allow discharge to the venue listed below.  Pt ambulated in hallway on room air and SpO2 decreased so applied 2L O2 [place].  Pt reports her daughter lives beside her and can provide initial supervision upon d/c.  SATURATION QUALIFICATIONS: (This note is used to comply with regulatory documentation for home oxygen)  Patient Saturations on Room Air at Rest = 95%  Patient Saturations on Room Air while Ambulating = 87%  Patient Saturations on 2 Liters of oxygen while Ambulating = 90-92%  Please briefly explain why patient needs home oxygen: to improve oxygen saturations during mobility and functional tasks.     Follow Up Recommendations Home health PT;Supervision for mobility/OOB    Equipment Recommendations  Rolling walker with 5" wheels    Recommendations for Other Services       Precautions / Restrictions Precautions Precautions: Fall Precaution Comments: monitor sats Restrictions Weight Bearing Restrictions: No      Mobility  Bed Mobility Overal bed mobility: Modified Independent             General bed mobility comments: increased time  Transfers Overall transfer level: Needs assistance Equipment used: Rolling walker (2 wheeled) Transfers: Sit to/from Stand Sit to Stand: Min assist         General transfer comment: verbal cues for safe technique  Ambulation/Gait Ambulation/Gait assistance: Min guard Ambulation Distance (Feet): 200 Feet Assistive  device: Rolling walker (2 wheeled) Gait Pattern/deviations: Step-through pattern;Narrow base of support     General Gait Details: verbal cues for use of RW, required supplemental oxygen as pt desaturated on room air  Stairs            Wheelchair Mobility    Modified Rankin (Stroke Patients Only)       Balance Overall balance assessment:  (denies recent falls)                                           Pertinent Vitals/Pain Pain Assessment: No/denies pain    Home Living Family/patient expects to be discharged to:: Private residence Living Arrangements: Alone Available Help at Discharge: Family (daughter lives next door) Type of Home: House (townhome) Home Access: Level entry     Home Layout: One level Home Equipment: None      Prior Function Level of Independence: Independent               Hand Dominance        Extremity/Trunk Assessment               Lower Extremity Assessment: Generalized weakness         Communication   Communication: No difficulties  Cognition Arousal/Alertness: Awake/alert Behavior During Therapy: WFL for tasks assessed/performed Overall Cognitive Status: Within Functional Limits for tasks assessed                      General Comments      Exercises  Assessment/Plan    PT Assessment Patient needs continued PT services  PT Diagnosis Difficulty walking   PT Problem List Decreased strength;Decreased activity tolerance;Decreased mobility;Cardiopulmonary status limiting activity;Decreased knowledge of use of DME  PT Treatment Interventions Gait training;DME instruction;Functional mobility training;Therapeutic activities;Therapeutic exercise;Patient/family education   PT Goals (Current goals can be found in the Care Plan section) Acute Rehab PT Goals PT Goal Formulation: With patient Time For Goal Achievement: 02/15/14 Potential to Achieve Goals: Good    Frequency Min  3X/week   Barriers to discharge        Co-evaluation               End of Session Equipment Utilized During Treatment: Oxygen Activity Tolerance: Patient tolerated treatment well Patient left: in chair;with chair alarm set;with call bell/phone within reach           Time: 0946-1002 PT Time Calculation (min): 16 min   Charges:   PT Evaluation $Initial PT Evaluation Tier I: 1 Procedure PT Treatments $Gait Training: 8-22 mins   PT G Codes:          Fredderick Swanger,KATHrine E 02/08/2014, 11:56 AM Zenovia Jarred, PT, DPT 02/08/2014 Pager: [phone]

## 2014-02-08 NOTE — Progress Notes (Signed)
ANTICOAGULATION CONSULT NOTE - Follow-up  Pharmacy Consult for Coumadin Indication: atrial fibrillation  Allergies  Allergen Reactions  . Cephalexin Other (See Comments)    "Causes an infection"  . Penicillins Swelling    Patient Measurements: Height:  (157.5 cm) Weight: 104 lb 4.4 oz (47.3 kg) IBW/kg (Calculated) : 50.1  Vital Signs: Temp: 98 F (36.7 C) (09/24 0608) Temp src: Oral (09/24 0608) BP: 129/61 mmHg (09/24 0608) Pulse Rate: 64 (09/24 0608)  Labs:  Recent Labs  02/05/14 1129 02/05/14 1808 02/05/14 2355 02/06/14 0615 02/07/14 0350 02/08/14 0423  HGB 14.0 14.0  --  14.0  --   --   HCT 43.1 42.4  --  42.2  --   --   PLT 179 179  --  145*  --   --   LABPROT 20.8*  --   --  23.7* 33.0* 36.3*  INR 1.79*  --   --  2.12* 3.23* 3.65*  CREATININE 1.12* 1.30*  --  1.31* 1.68* 1.54*  TROPONINI  --  <0.30 <0.30 <0.30  --   --     Estimated Creatinine Clearance: 17.8 ml/min (by C-G formula based on Cr of 1.54).   Medical History: Past Medical History  Diagnosis Date  . Hypertension   . Atrial fibrillation   . Syncope   . Drug therapy     coumadin  . Pneumonia     History of  . Bronchiectasis     History of    Medications:  Prescriptions prior to admission  Medication Sig Dispense Refill  . Bioflavonoid Products (VITAMIN C) CHEW Chew 2 tablets by mouth daily.      . cetirizine (ZYRTEC) 10 MG tablet Take 10 mg by mouth daily.      . digoxin (LANOXIN) 0.125 MG tablet Take 0.125 mg by mouth every 7 (seven) days. Take on Monday      . diltiazem (DILACOR XR) 240 MG 24 hr capsule Take 240 mg by mouth daily.      . Fluticasone Furoate-Vilanterol (BREO ELLIPTA) 200-25 MCG/INH AEPB Inhale 1 Inhaler into the lungs daily.       Marland Kitchen losartan (COZAAR) 100 MG tablet Take 100 mg by mouth daily.      . Multiple Vitamin (MULTIVITAMIN) tablet Take 1 tablet by mouth daily.        Marland Kitchen warfarin (COUMADIN) 5 MG tablet Take 2.5 mg by mouth See admin instructions. Take  2.5mg  daily except on Monday       Scheduled:  . [START ON 02/11/2014] digoxin  0.125 mg Oral Q7 days  . diltiazem  120 mg Oral Daily  . sodium chloride  3 mL Intravenous Q12H  . Warfarin - Pharmacist Dosing Inpatient   Does not apply q1800    Assessment: 80yo F admitted with progressively worsening SOB x 2 weeks. Recently took a bus trip to [place] but denies any leg swelling or chest pain. Pharmacy is asked to monitor her Coumadin for atrial fibrillation. Home dose is 2.5mg  daily except nothing on Mondays.  Inpatient warfarin doses: 9/21:  Warfarin 4 mg 9/22:  Warfarin 2.5 mg 9/23: Warfarin held  Today 9/24:   INR supratherapeutic today at 3.65.  Heart failure exacerbations may be associated with an increased response to warfarin.  Last CBC (9/22) WNL  SCr 1.54, CrCl ~27 N.  Drug Interactions:  None  Diet:  Regular, intake not documented  Goal of Therapy:  INR 2-3 Monitor platelets by anticoagulation protocol: Yes   Plan:  Hold warfarin again today.   Monitor for signs and sx of bleeding.  Follow daily INR.  Loma Boston, PharmD Pager: [phone] 02/08/2014 7:37 AM

## 2014-02-08 NOTE — Progress Notes (Signed)
Oxygen saturation while sitting 94-RA, with ambulation between 89-92%-RA, stayed mostly 92%-RA. Off oxygen now, encouraged patient to notify staff if any distress. Will continue to monitor patient.

## 2014-02-08 NOTE — Progress Notes (Signed)
Talked to patient about HHC choices, patient chose [facility] for HHRN/ PT/ OT; DME ordered also; Alexis Goodell [phone]

## 2014-02-08 NOTE — Progress Notes (Signed)
[facility]  [facility] is providing the following services: Home 02 and RW  If patient discharges after hours, please call [phone].   Candice Rangel 02/08/2014, 2:05 PM

## 2014-02-12 ENCOUNTER — Emergency Department (HOSPITAL_COMMUNITY): Payer: Medicare HMO

## 2014-02-12 ENCOUNTER — Encounter (HOSPITAL_COMMUNITY): Payer: Self-pay | Admitting: Emergency Medicine

## 2014-02-12 ENCOUNTER — Observation Stay (HOSPITAL_COMMUNITY)
Admission: EM | Admit: 2014-02-12 | Discharge: 2014-02-14 | Disposition: A | Discharge status: Home / Self Care | Payer: Medicare HMO | Attending: Internal Medicine | Admitting: Internal Medicine

## 2014-02-12 ENCOUNTER — Inpatient Hospital Stay (HOSPITAL_COMMUNITY): Payer: Medicare HMO

## 2014-02-12 DIAGNOSIS — Z79899 Other long term (current) drug therapy: Secondary | ICD-10-CM | POA: Diagnosis not present

## 2014-02-12 DIAGNOSIS — N183 Chronic kidney disease, stage 3 unspecified: Secondary | ICD-10-CM

## 2014-02-12 DIAGNOSIS — I482 Chronic atrial fibrillation, unspecified: Secondary | ICD-10-CM

## 2014-02-12 DIAGNOSIS — Z880 Allergy status to penicillin: Secondary | ICD-10-CM | POA: Insufficient documentation

## 2014-02-12 DIAGNOSIS — I4891 Unspecified atrial fibrillation: Secondary | ICD-10-CM | POA: Insufficient documentation

## 2014-02-12 DIAGNOSIS — Z7901 Long term (current) use of anticoagulants: Secondary | ICD-10-CM | POA: Insufficient documentation

## 2014-02-12 DIAGNOSIS — R531 Weakness: Secondary | ICD-10-CM

## 2014-02-12 DIAGNOSIS — J9611 Chronic respiratory failure with hypoxia: Secondary | ICD-10-CM | POA: Diagnosis present

## 2014-02-12 DIAGNOSIS — R5381 Other malaise: Principal | ICD-10-CM | POA: Insufficient documentation

## 2014-02-12 DIAGNOSIS — Z8709 Personal history of other diseases of the respiratory system: Secondary | ICD-10-CM | POA: Insufficient documentation

## 2014-02-12 DIAGNOSIS — Z8701 Personal history of pneumonia (recurrent): Secondary | ICD-10-CM | POA: Insufficient documentation

## 2014-02-12 DIAGNOSIS — R29898 Other symptoms and signs involving the musculoskeletal system: Secondary | ICD-10-CM

## 2014-02-12 DIAGNOSIS — I10 Essential (primary) hypertension: Secondary | ICD-10-CM | POA: Diagnosis not present

## 2014-02-12 DIAGNOSIS — I509 Heart failure, unspecified: Secondary | ICD-10-CM

## 2014-02-12 DIAGNOSIS — R5383 Other fatigue: Secondary | ICD-10-CM | POA: Diagnosis not present

## 2014-02-12 DIAGNOSIS — R778 Other specified abnormalities of plasma proteins: Secondary | ICD-10-CM | POA: Diagnosis present

## 2014-02-12 DIAGNOSIS — R7989 Other specified abnormal findings of blood chemistry: Secondary | ICD-10-CM

## 2014-02-12 DIAGNOSIS — I5032 Chronic diastolic (congestive) heart failure: Secondary | ICD-10-CM

## 2014-02-12 HISTORY — DX: Dependence on supplemental oxygen: Z99.81

## 2014-02-12 HISTORY — DX: Unspecified osteoarthritis, unspecified site: M19.90

## 2014-02-12 LAB — CBC
HEMATOCRIT: 43.7 % (ref 36.0–46.0)
HEMOGLOBIN: 15.1 g/dL — AB (ref 12.0–15.0)
MCH: 32.8 pg (ref 26.0–34.0)
MCHC: 34.6 g/dL (ref 30.0–36.0)
MCV: 94.8 fL (ref 78.0–100.0)
Platelets: 178 10*3/uL (ref 150–400)
RBC: 4.61 MIL/uL (ref 3.87–5.11)
RDW: 15.4 % (ref 11.5–15.5)
WBC: 10.9 10*3/uL — ABNORMAL HIGH (ref 4.0–10.5)

## 2014-02-12 LAB — I-STAT TROPONIN, ED: TROPONIN I, POC: 0.13 ng/mL — AB (ref 0.00–0.08)

## 2014-02-12 LAB — COMPREHENSIVE METABOLIC PANEL
ALT: 16 U/L (ref 0–35)
AST: 24 U/L (ref 0–37)
Albumin: 3 g/dL — ABNORMAL LOW (ref 3.5–5.2)
Alkaline Phosphatase: 64 U/L (ref 39–117)
Anion gap: 9 (ref 5–15)
BILIRUBIN TOTAL: 1 mg/dL (ref 0.3–1.2)
BUN: 31 mg/dL — ABNORMAL HIGH (ref 6–23)
CHLORIDE: 96 meq/L (ref 96–112)
CO2: 33 meq/L — AB (ref 19–32)
CREATININE: 1.22 mg/dL — AB (ref 0.50–1.10)
Calcium: 9.4 mg/dL (ref 8.4–10.5)
GFR calc Af Amer: 44 mL/min — ABNORMAL LOW (ref >90)
GFR, EST NON AFRICAN AMERICAN: 38 mL/min — AB (ref >90)
Glucose, Bld: 93 mg/dL (ref 70–99)
Potassium: 4.7 mEq/L (ref 3.7–5.3)
Sodium: 138 mEq/L (ref 137–147)
Total Protein: 8.3 g/dL (ref 6.0–8.3)

## 2014-02-12 LAB — URINALYSIS, ROUTINE W REFLEX MICROSCOPIC
BILIRUBIN URINE: NEGATIVE
Glucose, UA: NEGATIVE mg/dL
KETONES UR: NEGATIVE mg/dL
LEUKOCYTES UA: NEGATIVE
Nitrite: NEGATIVE
PROTEIN: 30 mg/dL — AB
Specific Gravity, Urine: 1.018 (ref 1.005–1.030)
Urobilinogen, UA: 0.2 mg/dL (ref 0.0–1.0)
pH: 5 (ref 5.0–8.0)

## 2014-02-12 LAB — PROTIME-INR
INR: 2.24 — AB (ref 0.00–1.49)
Prothrombin Time: 24.8 seconds — ABNORMAL HIGH (ref 11.6–15.2)

## 2014-02-12 LAB — CBG MONITORING, ED: Glucose-Capillary: 95 mg/dL (ref 70–99)

## 2014-02-12 LAB — DIGOXIN LEVEL: Digoxin Level: <0.3 ng/mL — ABNORMAL LOW (ref 0.8–2.0)

## 2014-02-12 LAB — URINE MICROSCOPIC-ADD ON

## 2014-02-12 LAB — PRO B NATRIURETIC PEPTIDE: Pro B Natriuretic peptide (BNP): 2992 pg/mL — ABNORMAL HIGH (ref 0–450)

## 2014-02-12 LAB — TROPONIN I

## 2014-02-12 MED ORDER — STROKE: EARLY STAGES OF RECOVERY BOOK
Freq: Once | Status: AC
Start: 2014-02-12 — End: 2014-02-12
  Administered 2014-02-12: 17:00:00
  Filled 2014-02-12: qty 1

## 2014-02-12 MED ORDER — ONDANSETRON HCL 4 MG/2ML IJ SOLN
4.0000 mg | Freq: Four times a day (QID) | INTRAMUSCULAR | Status: DC | PRN
  Administered 2014-02-13: 4 mg via INTRAVENOUS
  Filled 2014-02-12: qty 2

## 2014-02-12 MED ORDER — WARFARIN - PHARMACIST DOSING INPATIENT
Freq: Every day | Status: DC
  Administered 2014-02-12 – 2014-02-13 (×2)

## 2014-02-12 MED ORDER — SALINE SPRAY 0.65 % NA SOLN
2.0000 | Freq: Every day | NASAL | Status: DC
  Administered 2014-02-13: 2 via NASAL
  Filled 2014-02-12: qty 44

## 2014-02-12 MED ORDER — INFLUENZA VAC SPLIT QUAD 0.5 ML IM SUSY
0.5000 mL | PREFILLED_SYRINGE | INTRAMUSCULAR | Status: DC
  Filled 2014-02-12: qty 0.5

## 2014-02-12 MED ORDER — PANTOPRAZOLE SODIUM 40 MG PO TBEC
40.0000 mg | DELAYED_RELEASE_TABLET | Freq: Every day | ORAL | Status: DC
  Administered 2014-02-13 – 2014-02-14 (×2): 40 mg via ORAL
  Filled 2014-02-12 (×2): qty 1

## 2014-02-12 MED ORDER — SODIUM CHLORIDE 0.9 % IV SOLN
INTRAVENOUS | Status: AC
  Administered 2014-02-12: 17:00:00 via INTRAVENOUS

## 2014-02-12 MED ORDER — SENNOSIDES-DOCUSATE SODIUM 8.6-50 MG PO TABS
1.0000 | ORAL_TABLET | Freq: Every evening | ORAL | Status: DC | PRN
  Filled 2014-02-12: qty 1

## 2014-02-12 MED ORDER — ADULT MULTIVITAMIN W/MINERALS CH
2.0000 | ORAL_TABLET | Freq: Every day | ORAL | Status: DC
  Administered 2014-02-13 – 2014-02-14 (×2): 2 via ORAL
  Filled 2014-02-12 (×2): qty 2

## 2014-02-12 MED ORDER — DILTIAZEM HCL ER 120 MG PO CP24
120.0000 mg | ORAL_CAPSULE | Freq: Every day | ORAL | Status: DC
  Administered 2014-02-12 – 2014-02-14 (×3): 120 mg via ORAL
  Filled 2014-02-12 (×3): qty 1

## 2014-02-12 MED ORDER — WARFARIN SODIUM 2.5 MG PO TABS
2.5000 mg | ORAL_TABLET | ORAL | Status: DC
  Administered 2014-02-13: 2.5 mg via ORAL
  Filled 2014-02-12 (×2): qty 1

## 2014-02-12 MED ORDER — LORATADINE 10 MG PO TABS
10.0000 mg | ORAL_TABLET | Freq: Every day | ORAL | Status: DC
  Administered 2014-02-14: 10 mg via ORAL
  Filled 2014-02-12 (×3): qty 1

## 2014-02-12 MED ORDER — ACETAMINOPHEN 650 MG RE SUPP: 650.0000 mg | RECTAL | Status: DC | PRN

## 2014-02-12 MED ORDER — ONE-DAILY MULTI VITAMINS PO TABS: 2.0000 | ORAL_TABLET | Freq: Every day | ORAL | Status: DC

## 2014-02-12 MED ORDER — ACETAMINOPHEN 325 MG PO TABS
650.0000 mg | ORAL_TABLET | ORAL | Status: DC | PRN
  Administered 2014-02-13 – 2014-02-14 (×2): 650 mg via ORAL
  Filled 2014-02-12 (×2): qty 2

## 2014-02-12 MED ORDER — FLUTICASONE FUROATE-VILANTEROL 200-25 MCG/INH IN AEPB: 1.0000 | INHALATION_SPRAY | Freq: Every day | RESPIRATORY_TRACT | Status: DC | PRN

## 2014-02-12 MED ORDER — DIGOXIN 125 MCG PO TABS
0.1250 mg | ORAL_TABLET | Freq: Every day | ORAL | Status: DC
  Filled 2014-02-12: qty 1

## 2014-02-12 NOTE — ED Provider Notes (Signed)
Medical screening examination/treatment/procedure(s) were performed by non-physician practitioner and as supervising physician I was immediately available for consultation/collaboration.   EKG Interpretation   Date/Time:  Monday February 12 2014 10:16:17 EDT Ventricular Rate:  94 PR Interval:    QRS Duration: 87 QT Interval:  523 QTC Calculation: 654 R Axis:   -39 Text Interpretation:  Atrial fibrillation Ventricular premature complex  Anterior infarct, old Prolonged QT interval Similar to prior from 1 week  ago Confirmed by Gwendolyn Grant  MD, Terralyn Matsumura [phone]) on 02/12/2014 10:53:53 AM      Patient here with generalized weakness. Recent admission for CHF. No CP. Had an episode of LLE weakness today, resolved on its own. Negative stroke screen on arrival. Broad based workup shows elevated troponin. Admitted. Neurology consulted for possible TIA with LLE transient motor loss.  Elwin Mocha, MD 02/12/14 [phone]

## 2014-02-12 NOTE — H&P (Signed)
[facility] History and Physical  [facility] ZOX:[id number] DOB: 1921-04-08 DOA: 02/12/2014  Referring physician: Dr. Gwendolyn Grant PCP: Pamelia Hoit, MD   Chief Complaint: Left leg weakness  HPI: Candice Rangel is a 80 y.o. female  With history of chronic atrial fibrillation on Coumadin therapy, diastolic CHF, hypertension, recently hospitalized 1-2 weeks ago with acute on chronic diastolic heart failure who presents to the ED with sudden onset of left leg weakness. Per daughter patient was last seen in a normal state around 11:30 PM the night prior to admission. Daughter states that mother have been slowly getting week. At 6:30 AM on the day of admission patient states she awoke and could not lift her left leg. Patient called her daughter around 60 AM could not lift her leg EMS was called the patient was brought to the ED. Patient and daughter deny any slurred speech, no visual changes, no facial droop, no shortness of breath, no fever, no chills, no nausea, no vomiting, no abdominal pain, no diarrhea, no constipation, no dysuria. Daughter states mother has progressively been getting weak. Daughter states that on discharge from last hospitalization she was placed on diuretics and patient's diltiazem dose was decreased from 240 mg 120 mg daily. Patient's mother states that patient usually gets her medications from a pill box and 3 days prior to admission patient took Cardizem 240 mg into the 120 mg. Daughter states that evening mother started to feel weak and felt weak all day the next day and was not acting herself. One day prior to admission daughter held patient's Cardizem and diuretic as she fell disease was causing patient symptoms. Patient was subsequently brought to the emergency room. In the ED CT of the head which was done was negative for any acute abnormalities. Comprehensive metabolic profile done and a bicarbonate of 33 BUN of 31 creatinine 1.2 to otherwise was within normal limits.  Point-of-care troponin was elevated at 0.13. Pro BNP was elevated at 2992. CBC had a white count of 10.9 hemoglobin of 15.1 otherwise was unremarkable. INR was 2.24. Dig level is less than 0.3. Urinalysis done was unremarkable. Chest x-ray was negative for any acute abnormalities. EKG showed atrial fibrillation with a rate of 94 with some T-wave inversions in leads V4 through V6 which is unchanged from prior EKG.   Review of Systems: As per history of present illness otherwise negative. Constitutional:  No weight loss, night sweats, Fevers, chills, fatigue.  HEENT:  No headaches, Difficulty swallowing,Tooth/dental problems,Sore throat,  No sneezing, itching, ear ache, nasal congestion, post nasal drip,  Cardio-vascular:  No chest pain, Orthopnea, PND, swelling in lower extremities, anasarca, dizziness, palpitations  GI:  No heartburn, indigestion, abdominal pain, nausea, vomiting, diarrhea, change in bowel habits, loss of appetite  Resp:  No shortness of breath with exertion or at rest. No excess mucus, no productive cough, No non-productive cough, No coughing up of blood.No change in color of mucus.No wheezing.No chest wall deformity  Skin:  no rash or lesions.  GU:  no dysuria, change in color of urine, no urgency or frequency. No flank pain.  Musculoskeletal:  No joint pain or swelling. No decreased range of motion. No back pain.  Psych:  No change in mood or affect. No depression or anxiety. No memory loss.   Past Medical History  Diagnosis Date  . Hypertension   . Atrial fibrillation   . Syncope   . Drug therapy     coumadin  . Pneumonia     History of  .  Bronchiectasis     History of   Past Surgical History  Procedure Laterality Date  . Knee surgery      Left knee aspiration and injection  . Tonsillectomy    . Cesarean section    . Leg surgery      Following a trauma  . Cataract extraction, bilateral    . Hip fracture surgery      Right   Social History:  reports  that she has never smoked. She has never used smokeless tobacco. She reports that she does not drink alcohol or use illicit drugs.  Allergies  Allergen Reactions  . Cephalexin Other (See Comments)    "Causes an infection"  . Other     Pain medicine , unsure of which one, used for gout, made her feel loopy  . Penicillins Swelling    Family History  Problem Relation Age of Onset  . Heart attack Mother 61  . Pneumonia Father      Prior to Admission medications   Medication Sig Start Date End Date Taking? Authorizing Provider  Bioflavonoid Products (VITAMIN C) CHEW Chew 2 tablets by mouth daily.   Yes Historical Provider, MD  cetirizine (ZYRTEC) 10 MG tablet Take 10 mg by mouth daily.   Yes Historical Provider, MD  digoxin (LANOXIN) 0.125 MG tablet Take 0.125 mg by mouth daily. Take on Monday   Yes Historical Provider, MD  diltiazem (DILACOR XR) 120 MG 24 hr capsule Take 1 capsule (120 mg total) by mouth daily. 02/08/14  Yes Renae Fickle, MD  Fluticasone Furoate-Vilanterol (BREO ELLIPTA) 200-25 MCG/INH AEPB Inhale 1 Inhaler into the lungs daily as needed (Shortness of Breath).    Yes Historical Provider, MD  furosemide (LASIX) 40 MG tablet Take 1 tablet (40 mg total) by mouth daily. 02/08/14  Yes Renae Fickle, MD  Multiple Vitamin (MULTIVITAMIN) tablet Take 2 tablets by mouth daily.    Yes Historical Provider, MD  sodium chloride (OCEAN) 0.65 % SOLN nasal spray Place 2 sprays into both nostrils at bedtime.   Yes Historical Provider, MD  warfarin (COUMADIN) 5 MG tablet Take 2.5 mg by mouth 4 (four) times a week.   Yes Historical Provider, MD   Physical Exam: Filed Vitals:   02/12/14 1345 02/12/14 1400 02/12/14 1415 02/12/14 1430  BP: 119/76 131/75 123/82 137/72  Pulse: 93 108 131 108  Temp:      TempSrc:      Resp: Height:      Weight:      SpO2: 98% 100% 98% 97%    Wt Readings from Last 3 Encounters:  02/12/14 47.174 kg (104 lb)  02/07/14 47.3 kg (104 lb 4.4  oz)  01/03/14 52.98 kg (116 lb 12.8 oz)    General:  Elderly frail female in no acute cardiopulmonary distress. Speaking in full sentences.  Eyes: PERRLA, EOMI, normal lids, irises & conjunctiva ENT: grossly normal hearing, lips & tongue. Dry mucous membranes Neck: no LAD, masses or thyromegaly Cardiovascular: Irregularly irregular. No LE edema. Telemetry: Atrial fibrillation Respiratory: CTA bilaterally, no w/r/r. Normal respiratory effort. Abdomen: soft, ntnd, positive bowel sounds, no rebound, no guarding Skin: no rash or induration seen on limited exam Musculoskeletal: grossly normal tone BUE/BLE Psychiatric: grossly normal mood and affect, speech fluent and appropriate Neurologic: Alert and oriented x3. Cranial nerves II through XII are grossly intact. Sensation is intact. Unable to elicit reflexes symmetrically and are diffusely. Visual fields are intact. 5/5 bilateral upper extremity strength, 4/5 left  lower extremity strength. 5/5 RLE strength. Gait not tested secondary to safety.           Labs on Admission:  Basic Metabolic Panel:  Recent Labs Lab 02/05/14 1808 02/06/14 0615 02/07/14 0350 02/08/14 0423 02/12/14 1147  NA  --  141 141 140 138  K  --  4.2 4.4 4.2 4.7  CL  --  100 100 98 96  CO2  --  29 31 34* 33*  GLUCOSE  --  97 105* 98 93  BUN  --  30* 45* 41* 31*  CREATININE 1.30* 1.31* 1.68* 1.54* 1.22*  CALCIUM  --  9.4 9.1 8.8 9.4   Liver Function Tests:  Recent Labs Lab 02/06/14 0615 02/12/14 1147  AST 25 24  ALT 25 16  ALKPHOS 61 64  BILITOT 0.8 1.0  PROT 8.2 8.3  ALBUMIN 3.3* 3.0*   No results found for this basename: LIPASE, AMYLASE,  in the last 168 hours No results found for this basename: AMMONIA,  in the last 168 hours CBC:  Recent Labs Lab 02/05/14 1808 02/06/14 0615 02/12/14 1147  WBC 9.1 7.9 10.9*  HGB 14.0 14.0 15.1*  HCT 42.4 42.2 43.7  MCV 97.2 97.0 94.8  PLT 179 145* 178   Cardiac Enzymes:  Recent Labs Lab 02/05/14 1808  02/05/14 2355 02/06/14 0615  TROPONINI <0.30 <0.30 <0.30    BNP (last 3 results)  Recent Labs  02/05/14 1350 02/07/14 0350 02/12/14 1147  PROBNP 3066.0* 2980.0* 2992.0*   CBG:  Recent Labs Lab 02/12/14 1115  GLUCAP 95    Radiological Exams on Admission: Dg Chest 2 View  02/12/2014   CLINICAL DATA:  Shortness of breath, weakness. History of hypertension.  EXAM: CHEST  2 VIEW  COMPARISON:  02/05/2014  FINDINGS: The heart is enlarged. There is perihilar bronchitic change. There has been improvement in aeration at the right lung base. Persistent minimal densities are identified at both lung bases. There are small bilateral pleural effusions.  IMPRESSION: 1. Bronchitic changes. 2. Improved aeration at the right lung base. 3. Persistent minimal atelectasis or resolving infiltrates and small effusions.   Electronically Signed   By: Rosalie Gums M.D.   On: 02/12/2014 11:14   Ct Head Wo Contrast  02/12/2014   CLINICAL DATA:  Weakness  EXAM: CT HEAD WITHOUT CONTRAST  TECHNIQUE: Contiguous axial images were obtained from the base of the skull through the vertex without intravenous contrast.  COMPARISON:  08/12/2007  FINDINGS: There is atrophy and chronic small vessel disease changes. No acute intracranial abnormality. Specifically, no hemorrhage, hydrocephalus, mass lesion, acute infarction, or significant intracranial injury. No acute calvarial abnormality. Visualized paranasal sinuses and mastoids clear. Orbital soft tissues unremarkable.  IMPRESSION: No acute intracranial abnormality.  Atrophy, chronic microvascular disease.   Electronically Signed   By: Charlett Nose M.D.   On: 02/12/2014 11:40    EKG: Independently reviewed. Atrial fibrillation with nonspecific T-wave inversions in V4 through V6  Assessment/Plan Principal Problem:   Left leg weakness Active Problems:   HYPERTENSION   Atrial fibrillation   CHF (congestive heart failure), NYHA class II   CKD (chronic kidney disease),  stage III   Chronic respiratory failure with hypoxia   Weakness   Elevated troponin  #1 left leg weakness Concern for possible TIA versus cardiac etiology. Patient with history of atrial fibrillation on chronic anticoagulation therapy. INR is therapeutic. Head CT was negative for any acute abnormalities. Patient with recent 2-D echo done during her last hospitalization  as such we'll not repeat. Admit to telemetry. Stroke workup. An MRI/MRA of the head. Carotid Dopplers. Continue Coumadin for secondary stroke prevention. PT/OT. Also with neurology for further evaluation and management.  #2 elevated troponin Questionable etiology. There is secondary to demand ischemia as patient with borderline blood pressure issues. We'll cycle cardiac enzymes every 6 hours x3. We'll not repeat 2-D echo as well was recently done. Consult cardiology for further evaluation and management.  #3 atrial fibrillation Continue decreased dose of Cardizem 120 mg daily for rate control. Continue Coumadin for anticoagulation. Cardiology has been consulted.  #4 chronic kidney disease stage III Stable. At baseline.  #5 history of diastolic CHF Stable. Patient is clinically euvolemic. Hold diuretics for now. Cardiology has been consulted.  #6 chronic respiratory failure with hypoxia Patient chronically on home O2 at 2 L. Continue oxygen.   #7 prophylaxis Coumadin for DVT prophylaxis.  Code Status: DO NOT RESUSCITATE DVT Prophylaxis: On Coumadin. Family Communication: Updated patient, daughter and great granddaughter at bedside. Disposition Plan: Admit to telemetry.  Time spent: 70 minutes  [facility] MD [facility] Pager [phone]

## 2014-02-12 NOTE — Consult Note (Signed)
Referring Physician: Janee Morn    Chief Complaint: left leg weakness  HPI:                                                                                                                                         Candice Rangel is an 80 y.o. female with known Afib on chronic coumadin who was recently seen in hospital for acute on chronic diastolic heart failure and respiratory failure. Patient was discharged home on 02-08-2014 with INR of 3.65 at that time. Pateint was doing well until this AM.  She awoke from sleep this AM and noted she was not able move her left leg as well. Patient was brought to hospital due to concerns of possible stroke. Currently patient still has complaints of left leg weakness.   Date last known well: Date: 02/11/2014 Time last known well: Unable to determine tPA Given: No: out of the window with no known LSN  Past Medical History  Diagnosis Date  . Hypertension   . Atrial fibrillation   . Syncope   . Drug therapy     coumadin  . Pneumonia     History of  . Bronchiectasis     History of    Past Surgical History  Procedure Laterality Date  . Knee surgery      Left knee aspiration and injection  . Tonsillectomy    . Cesarean section    . Leg surgery      Following a trauma  . Cataract extraction, bilateral    . Hip fracture surgery      Right    Family History  Problem Relation Age of Onset  . Heart attack Mother 59  . Pneumonia Father    Social History:  reports that she has never smoked. She has never used smokeless tobacco. She reports that she does not drink alcohol or use illicit drugs.  Allergies:  Allergies  Allergen Reactions  . Cephalexin Other (See Comments)    "Causes an infection"  . Other     Pain medicine , unsure of which one, used for gout, made her feel loopy  . Penicillins Swelling    Medications:                                                                                                                            Scheduled: .  stroke: mapping our early stages of recovery book   Does not apply Once  . digoxin  0.125 mg Oral Daily  . diltiazem  120 mg Oral Daily  . loratadine  10 mg Oral Daily  . [START ON 02/13/2014] multivitamin with minerals  2 tablet Oral Daily  . [START ON 02/13/2014] pantoprazole  40 mg Oral Q0600  . sodium chloride  2 spray Each Nare QHS  . [START ON 02/13/2014] warfarin  2.5 mg Oral Once per day on Sun Tue Wed Sat  . Warfarin - Pharmacist Dosing Inpatient   Does not apply q1800    ROS:                                                                                                                                       History obtained from the patient  General ROS: negative for - chills, fatigue, fever, night sweats, weight gain or weight loss Psychological ROS: negative for - behavioral disorder, hallucinations, memory difficulties, mood swings or suicidal ideation Ophthalmic ROS: negative for - blurry vision, double vision, eye pain or loss of vision ENT ROS: negative for - epistaxis, nasal discharge, oral lesions, sore throat, tinnitus or vertigo Allergy and Immunology ROS: negative for - hives or itchy/watery eyes Hematological and Lymphatic ROS: negative for - bleeding problems, bruising or swollen lymph nodes Endocrine ROS: negative for - galactorrhea, hair pattern changes, polydipsia/polyuria or temperature intolerance Respiratory ROS: negative for - cough, hemoptysis, shortness of breath or wheezing Cardiovascular ROS: negative for - chest pain, dyspnea on exertion, edema or irregular heartbeat Gastrointestinal ROS: negative for - abdominal pain, diarrhea, hematemesis, nausea/vomiting or stool incontinence Genito-Urinary ROS: negative for - dysuria, hematuria, incontinence or urinary frequency/urgency Musculoskeletal ROS: negative for - joint swelling or muscular weakness Neurological ROS: as noted in HPI Dermatological ROS: negative for rash and skin lesion  changes  Neurologic Examination:                                                                                                      Blood pressure 131/69, pulse 96, temperature 98.2 F (36.8 C), temperature source Oral, resp. rate 20, height  (1.575 m), weight 47.174 kg (104 lb), SpO2 96.00%.   General: NAD Mental Status: Alert, oriented, thought content appropriate.  Speech fluent without evidence of aphasia.  Able to follow 3 step commands without difficulty. Cranial Nerves: II: Discs flat bilaterally; Visual fields grossly normal, pupils equal, round, reactive to light and accommodation III,IV, VI:  ptosis not present, extra-ocular motions intact bilaterally V,VII: smile symmetric, facial light touch sensation normal bilaterally VIII: hearing normal bilaterally IX,X: gag reflex present XI: bilateral shoulder shrug XII: midline tongue extension without atrophy or fasciculations  Motor: Right : Upper extremity   5/5    Left:     Upper extremity   5/5  Lower extremity   4/5     Lower extremity   4/5 give way weakness Tone and bulk:normal tone throughout; no atrophy noted Sensory: Pinprick and light touch intact throughout, bilaterally Deep Tendon Reflexes:  Right: Upper Extremity   Left: Upper extremity   biceps (C-5 to C-6) 2/4   biceps (C-5 to C-6) 2/4 tricep (C7) 2/4    triceps (C7) 2/4 Brachioradialis (C6) 2/4  Brachioradialis (C6) 2/4  Lower Extremity Lower Extremity  quadriceps (L-2 to L-4) 2/4   quadriceps (L-2 to L-4) 2/4 Achilles (S1) 0/4   Achilles (S1) 0/4  Plantars: Right: downgoing   Left: downgoing Cerebellar: normal finger-to-nose,  normal heel-to-shin test wit right leg but limit on the left due to strength and effort. Gait: not tested.  CV: pulses palpable throughout    Lab Results: Basic Metabolic Panel:  Recent Labs Lab 02/05/14 1808  02/06/14 0615 02/07/14 0350 02/08/14 0423 02/12/14 1147  NA  --   --  141 141 140 138  K  --   --  4.2  4.4 4.2 4.7  CL  --   --  100 100 98 96  CO2  --   --  29 31 34* 33*  GLUCOSE  --   --  97 105* 98 93  BUN  --   --  30* 45* 41* 31*  CREATININE 1.30*  --  1.31* 1.68* 1.54* 1.22*  CALCIUM  --   < > 9.4 9.1 8.8 9.4  < > = values in this interval not displayed.  Liver Function Tests:  Recent Labs Lab 02/06/14 0615 02/12/14 1147  AST 25 24  ALT 25 16  ALKPHOS 61 64  BILITOT 0.8 1.0  PROT 8.2 8.3  ALBUMIN 3.3* 3.0*   No results found for this basename: LIPASE, AMYLASE,  in the last 168 hours No results found for this basename: AMMONIA,  in the last 168 hours  CBC:  Recent Labs Lab 02/05/14 1808 02/06/14 0615 02/12/14 1147  WBC 9.1 7.9 10.9*  HGB 14.0 14.0 15.1*  HCT 42.4 42.2 43.7  MCV 97.2 97.0 94.8  PLT 179 145* 178    Cardiac Enzymes:  Recent Labs Lab 02/05/14 1808 02/05/14 2355 02/06/14 0615  TROPONINI <0.30 <0.30 <0.30    Lipid Panel: No results found for this basename: CHOL, TRIG, HDL, CHOLHDL, VLDL, LDLCALC,  in the last 168 hours  CBG:  Recent Labs Lab 02/12/14 1115  GLUCAP 95    Microbiology: Results for orders placed during the hospital encounter of 08/07/07  BODY FLUID CULTURE     Status: None   Collection Time    08/13/07 10:35 AM      Result Value Ref Range Status   Specimen Description KNEE LEFT   Final   Special Requests IMMUNE:NORM   Final   Gram Stain     Final   Value: ABUNDANT WBC PRESENT, PREDOMINANTLY PMN     NO SQUAMOUS EPITHELIAL CELLS SEEN     NO ORGANISMS SEEN   Culture NO GROWTH 3 DAYS   Final   Report Status 08/16/2007 FINAL   Final  CULTURE, BODY FLUID-BOTTLE  Status: None   Collection Time    08/13/07 10:35 AM      Result Value Ref Range Status   Specimen Description KNEE LEFT   Final   Special Requests BOTTLES DRAWN AEROBIC AND ANAEROBIC 3CC   Final   Culture NO GROWTH 5 DAYS   Final   Report Status 08/18/2007 FINAL   Final    Coagulation Studies:  Recent Labs  02/12/14 1147  LABPROT 24.8*  INR  2.24*    Imaging: Dg Chest 2 View  02/12/2014   CLINICAL DATA:  Shortness of breath, weakness. History of hypertension.  EXAM: CHEST  2 VIEW  COMPARISON:  02/05/2014  FINDINGS: The heart is enlarged. There is perihilar bronchitic change. There has been improvement in aeration at the right lung base. Persistent minimal densities are identified at both lung bases. There are small bilateral pleural effusions.  IMPRESSION: 1. Bronchitic changes. 2. Improved aeration at the right lung base. 3. Persistent minimal atelectasis or resolving infiltrates and small effusions.   Electronically Signed   By: Rosalie Gums M.D.   On: 02/12/2014 11:14   Ct Head Wo Contrast  02/12/2014   CLINICAL DATA:  Weakness  EXAM: CT HEAD WITHOUT CONTRAST  TECHNIQUE: Contiguous axial images were obtained from the base of the skull through the vertex without intravenous contrast.  COMPARISON:  08/12/2007  FINDINGS: There is atrophy and chronic small vessel disease changes. No acute intracranial abnormality. Specifically, no hemorrhage, hydrocephalus, mass lesion, acute infarction, or significant intracranial injury. No acute calvarial abnormality. Visualized paranasal sinuses and mastoids clear. Orbital soft tissues unremarkable.  IMPRESSION: No acute intracranial abnormality.  Atrophy, chronic microvascular disease.   Electronically Signed   By: Charlett Nose M.D.   On: 02/12/2014 11:40    Felicie Morn PA-C [facility] [phone]  02/12/2014, 3:22 PM   Patient seen and examined.  Clinical course and management discussed.  Necessary edits performed.  I agree with the above.  Assessment and plan of care developed and discussed below.    Assessment: 80 y.o. female with worsening LLE weakness.  Initially the history was that symptoms started this morning but after further conversation it seems that the symptoms have been slowly progressive since her last hospitalization with this morning being the tipping point.  The  patient is on Coumadin with a therapeutic INR.  Head CT reviewed and shows no acute changes.  Further work up recommended.  Stroke Risk Factors - atrial fibrillation and hypertension  Recommendations: 1.  MRI of the brain without contrast.  It no acute infarct noted would not pursue further stroke work up.  May consider further investigation with lumbar spine studies.   2.  PT 3.  Agree with continued Coumadin 4.  Frequent neuro checks 5.  Telemetry    Thana Farr, MD [facility] [phone]  02/12/2014  5:56 PM

## 2014-02-12 NOTE — ED Provider Notes (Signed)
CSN: [id number]     Arrival date & time 02/12/14  1004 History   First MD Initiated Contact with Patient 02/12/14 1014     Chief Complaint  Patient presents with  . Weakness     (Consider location/radiation/quality/duration/timing/severity/associated sxs/prior Treatment) HPI Atlantis Delong is a 80 y.o. female a history of chronic A. fib, CHF, hypertension comes in for evaluation of weakness. Patient was recently discharged from admission at [facility] on 9/24 for acute exacerbation of chronic diastolic heart failure. Patient states since Thursday she has been feeling better and is having no problems breathing. She reports this morning her left leg was weak, she was unable to lift it to get out of bed. Her daughter, who lives across the street, came over and dialled [phone] because she was worried about stroke. Daughter also reports having discontinued patient's diltiazem yesterday and that her symptoms are stemming from this medication. Her daughter, patient normally ambulates independently with her rolling walker. Denies fevers, chest pain, shortness of breath, dysuria, slurred speech or confusion, no swelling in her legs.  Past Medical History  Diagnosis Date  . Hypertension   . Atrial fibrillation   . Syncope   . Drug therapy     coumadin  . Pneumonia     History of  . Bronchiectasis     History of   Past Surgical History  Procedure Laterality Date  . Knee surgery      Left knee aspiration and injection  . Tonsillectomy    . Cesarean section    . Leg surgery      Following a trauma  . Cataract extraction, bilateral    . Hip fracture surgery      Right   Family History  Problem Relation Age of Onset  . Heart attack Mother 48  . Pneumonia Father    History  Substance Use Topics  . Smoking status: Never Smoker   . Smokeless tobacco: Never Used  . Alcohol Use: No   OB History   Grav Para Term Preterm Abortions TAB SAB Ect Mult Living                 Review of Systems   Constitutional: Negative for fever.  HENT: Negative for sore throat.   Eyes: Negative for visual disturbance.  Respiratory: Negative for shortness of breath.   Cardiovascular: Negative for chest pain.  Gastrointestinal: Negative for abdominal pain.  Endocrine: Negative for polyuria.  Genitourinary: Negative for dysuria.  Skin: Negative for rash.  Neurological: Positive for weakness. Negative for headaches.       Lower left extremity weakness      Allergies  Cephalexin; Other; and Penicillins  Home Medications   Prior to Admission medications   Medication Sig Start Date End Date Taking? Authorizing Provider  Bioflavonoid Products (VITAMIN C) CHEW Chew 2 tablets by mouth daily.   Yes Historical Provider, MD  cetirizine (ZYRTEC) 10 MG tablet Take 10 mg by mouth daily.   Yes Historical Provider, MD  digoxin (LANOXIN) 0.125 MG tablet Take 0.125 mg by mouth daily. Take on Monday   Yes Historical Provider, MD  diltiazem (DILACOR XR) 120 MG 24 hr capsule Take 1 capsule (120 mg total) by mouth daily. 02/08/14  Yes Renae Fickle, MD  Fluticasone Furoate-Vilanterol (BREO ELLIPTA) 200-25 MCG/INH AEPB Inhale 1 Inhaler into the lungs daily as needed (Shortness of Breath).    Yes Historical Provider, MD  furosemide (LASIX) 40 MG tablet Take 1 tablet (40 mg total) by mouth  daily. 02/08/14  Yes Renae Fickle, MD  Multiple Vitamin (MULTIVITAMIN) tablet Take 2 tablets by mouth daily.    Yes Historical Provider, MD  sodium chloride (OCEAN) 0.65 % SOLN nasal spray Place 2 sprays into both nostrils at bedtime.   Yes Historical Provider, MD  warfarin (COUMADIN) 5 MG tablet Take 2.5 mg by mouth 4 (four) times a week.   Yes Historical Provider, MD   BP 100/78  Pulse 101  Temp(Src) 98.4 F (36.9 C) (Oral)  Resp 25  Ht  (1.575 m)  Wt 104 lb (47.174 kg)  BMI 19.02 kg/m2  SpO2 100% Physical Exam  Nursing note and vitals reviewed. Constitutional: She is oriented to person, place, and time. She  appears well-developed and well-nourished.  HENT:  Head: Normocephalic and atraumatic.  Mouth/Throat: Oropharynx is clear and moist.  Eyes: Conjunctivae are normal. Pupils are equal, round, and reactive to light. Right eye exhibits no discharge. Left eye exhibits no discharge. No scleral icterus.  Neck: Neck supple. No JVD present.  Cardiovascular: Normal rate, regular rhythm and normal heart sounds.   Pulmonary/Chest: Effort normal and breath sounds normal. No respiratory distress. She has no wheezes. She has no rales.  No evidence of fluid overload  Abdominal: Soft. There is no tenderness.  Musculoskeletal: She exhibits no tenderness.  Neurological: She is alert and oriented to person, place, and time.  Patient is able to move left lower extremity but difficult to discern whether there is weakness or poor effort . Ankle plantar flexion is equal and intact bilaterally. Sensation equal bilaterally. Grip strength equal and intact bilaterally. Cranial Nerves II-XII grossly intact, no other focal neurodeficits appreciated  Skin: Skin is warm and dry. No rash noted.  Psychiatric: She has a normal mood and affect. Her behavior is normal. Thought content normal.    ED Course  Procedures (including critical care time) Labs Review Labs Reviewed  CBC - Abnormal; Notable for the following:    WBC 10.9 (*)    Hemoglobin 15.1 (*)    All other components within normal limits  COMPREHENSIVE METABOLIC PANEL - Abnormal; Notable for the following:    CO2 33 (*)    BUN 31 (*)    Creatinine, Ser 1.22 (*)    Albumin 3.0 (*)    GFR calc non Af Amer 38 (*)    GFR calc Af Amer 44 (*)    All other components within normal limits  PRO B NATRIURETIC PEPTIDE - Abnormal; Notable for the following:    Pro B Natriuretic peptide (BNP) 2992.0 (*)    All other components within normal limits  PROTIME-INR - Abnormal; Notable for the following:    Prothrombin Time 24.8 (*)    INR 2.24 (*)    All other components  within normal limits  URINALYSIS, ROUTINE W REFLEX MICROSCOPIC - Abnormal; Notable for the following:    Color, Urine AMBER (*)    Hgb urine dipstick MODERATE (*)    Protein, ur 30 (*)    All other components within normal limits  DIGOXIN LEVEL - Abnormal; Notable for the following:    Digoxin Level <0.3 (*)    All other components within normal limits  I-STAT TROPOININ, ED - Abnormal; Notable for the following:    Troponin i, poc 0.13 (*)    All other components within normal limits  URINE MICROSCOPIC-ADD ON  CBG MONITORING, ED    Imaging Review Dg Chest 2 View  02/12/2014   CLINICAL DATA:  Shortness of  breath, weakness. History of hypertension.  EXAM: CHEST  2 VIEW  COMPARISON:  02/05/2014  FINDINGS: The heart is enlarged. There is perihilar bronchitic change. There has been improvement in aeration at the right lung base. Persistent minimal densities are identified at both lung bases. There are small bilateral pleural effusions.  IMPRESSION: 1. Bronchitic changes. 2. Improved aeration at the right lung base. 3. Persistent minimal atelectasis or resolving infiltrates and small effusions.   Electronically Signed   By: Rosalie Gums M.D.   On: 02/12/2014 11:14   Ct Head Wo Contrast  02/12/2014   CLINICAL DATA:  Weakness  EXAM: CT HEAD WITHOUT CONTRAST  TECHNIQUE: Contiguous axial images were obtained from the base of the skull through the vertex without intravenous contrast.  COMPARISON:  08/12/2007  FINDINGS: There is atrophy and chronic small vessel disease changes. No acute intracranial abnormality. Specifically, no hemorrhage, hydrocephalus, mass lesion, acute infarction, or significant intracranial injury. No acute calvarial abnormality. Visualized paranasal sinuses and mastoids clear. Orbital soft tissues unremarkable.  IMPRESSION: No acute intracranial abnormality.  Atrophy, chronic microvascular disease.   Electronically Signed   By: Charlett Nose M.D.   On: 02/12/2014 11:40     EKG  Interpretation   Date/Time:  Monday February 12 2014 10:16:17 EDT Ventricular Rate:  94 PR Interval:    QRS Duration: 87 QT Interval:  523 QTC Calculation: 654 R Axis:   -39 Text Interpretation:  Atrial fibrillation Ventricular premature complex  Anterior infarct, old Prolonged QT interval Similar to prior from 1 week  ago Confirmed by [facility]  MD, BLAIR [phone]) on 02/12/2014 10:53:53 AM     Meds given in ED:  Medications - No data to display  New Prescriptions   No medications on file   Filed Vitals:   02/12/14 1030 02/12/14 1115 02/12/14 1141 02/12/14 1248  BP: 137/77 146/78 133/108 100/78  Pulse: 100 95 100 101  Temp:      TempSrc:      Resp: Height:      Weight:      SpO2: 99% 99% 100% 100%    MDM  Vitals stable - WNL -afebrile Pt resting comfortably in ED. PE shows potential weakness neurological deficits in the lower left extremity.This focal weakness has since resolved in the ED. Decision made to obtain head CT. CT shows no acute intracranial pathology. CXR shows improved aeration and resolving infiltrates. EKG not concerning and essentially unchanged  Labwork shows elevated Trop 0.13, INR 2.24, otherwise essentially unchanged and non contributory.  Dr. Gwendolyn Grant spoke with Neurology for potential TIA workup. Spoke with Dr. Janee Morn and agreed to have pt admitted to tele.   Prior to patient discharge, I discussed and reviewed this case with Dr.Walden    Final diagnoses:  Weakness        Sharlene Motts, PA-C 02/12/14 1531

## 2014-02-12 NOTE — ED Notes (Signed)
Patient transported to X-ray 

## 2014-02-12 NOTE — Progress Notes (Signed)
ANTICOAGULATION CONSULT NOTE - Initial Consult  Pharmacy Consult for Coumadin Indication: atrial fibrillation  Allergies  Allergen Reactions  . Cephalexin Other (See Comments)    "Causes an infection"  . Other     Pain medicine , unsure of which one, used for gout, made her feel loopy  . Penicillins Swelling    Patient Measurements: Height:  (157.5 cm) Weight: 104 lb (47.174 kg) IBW/kg (Calculated) : 50.1 Heparin Dosing Weight:    Vital Signs: Temp: 98.2 F (36.8 C) (09/28 1453) Temp src: Oral (09/28 1453) BP: 131/69 mmHg (09/28 1453) Pulse Rate: 96 (09/28 1453)  Labs:  Recent Labs  02/12/14 1147  HGB 15.1*  HCT 43.7  PLT 178  LABPROT 24.8*  INR 2.24*  CREATININE 1.22*    Estimated Creatinine Clearance: 22.4 ml/min (by C-G formula based on Cr of 1.22).   Medical History: Past Medical History  Diagnosis Date  . Hypertension   . Atrial fibrillation   . Syncope   . Drug therapy     coumadin  . Pneumonia     History of  . Bronchiectasis     History of    Medications:  Prescriptions prior to admission  Medication Sig Dispense Refill  . Bioflavonoid Products (VITAMIN C) CHEW Chew 2 tablets by mouth daily.      . cetirizine (ZYRTEC) 10 MG tablet Take 10 mg by mouth daily.      . digoxin (LANOXIN) 0.125 MG tablet Take 0.125 mg by mouth daily. Take on Monday      . diltiazem (DILACOR XR) 120 MG 24 hr capsule Take 1 capsule (120 mg total) by mouth daily.  30 capsule  0  . Fluticasone Furoate-Vilanterol (BREO ELLIPTA) 200-25 MCG/INH AEPB Inhale 1 Inhaler into the lungs daily as needed (Shortness of Breath).       . furosemide (LASIX) 40 MG tablet Take 1 tablet (40 mg total) by mouth daily.  30 tablet  0  . Multiple Vitamin (MULTIVITAMIN) tablet Take 2 tablets by mouth daily.       . sodium chloride (OCEAN) 0.65 % SOLN nasal spray Place 2 sprays into both nostrils at bedtime.      Marland Kitchen warfarin (COUMADIN) 5 MG tablet Take 2.5 mg by mouth 4 (four) times a  week.        Assessment: Weakness 80 y/o F with CAF, CHF, and HTN presents with weakness. Patient recently discharged for [facility] on 9/24 for CHF exac.   Anticoagulation: CAF on Coumadin PTA. INR 2.24.  Cardiovascular: CHF, CAF, HTN. Troponin 0.13. proBNP 2992. Meds: Digoxin, diltiazem  Endocrinology: Glucose 93  Gastrointestinal / Nutrition: LFT's WNL. Po PPI, po MV  Neurology:  Nephrology: CKD. Scr 1.22.  Pulmonary: bronchiectasis. Claritin  Hematology / Oncology: Hgb WNL  PTA Medication Issues  Best Practices    Goal of Therapy:  INR 2-3 Monitor platelets by anticoagulation protocol: Yes   Plan:  Resume Coumadin 2.5mg  T,W,SS Daily INR   Cristina Mattern S. Merilynn Finland, PharmD, BCPS Clinical Staff Pharmacist Pager [phone]  Misty Stanley Stillinger 02/12/2014,3:02 PM

## 2014-02-12 NOTE — ED Notes (Signed)
Attempted report 

## 2014-02-12 NOTE — ED Notes (Signed)
Report attempted 

## 2014-02-12 NOTE — ED Notes (Signed)
Troponin results given to Dr. Walden 

## 2014-02-12 NOTE — Consult Note (Signed)
CARDIOLOGY CONSULT NOTE   Patient IDAbree Rangel MRN: [id number] DOB/AGE: Aug 14, 1920 80 y.o.  Admit date: 02/12/2014  Primary Physician   Pamelia Hoit, MD Primary Cardiologist  Dr. Jens Som  Reason for Consultation   Elevated Troponin  [name] is a 80 y.o. female with a history of chronic atrial fibrillation, chronic anticoagulation, HTN, and bronchiectasis, but not CAD. Her last stress test was 2009, no scar or ischemia, EF 78%. She was hospitalized 09/21-09/24 for CHF, and had done pretty well since d/c. She was ambulating to the restroom, etc., with the use of a walker. She denies lower extremity edema, orthopnea, or PND. She has dyspnea on exertion, but does not exert herself very much. She is not aware of any weight gain. She was supposed to decrease her Cardizem from 240 mg daily down to 120 mg daily. However, she took one of the old ones the day after discharge and felt poorly, so her daughter held the Cardizem since then. The digoxin was supposed to be taken once a week, but she has taken it every day.  Today, when she woke up, she was unable to move her left leg. She states she did not have any problems moving her right leg or both arms. She does not remember trying to wiggle her foot or move her ankle around. She called her daughter, who lives next door, and came to the hospital where she was admitted. As part of her evaluation, a troponin was checked and was elevated. Additionally, a BNP was checked and is slightly higher than it was on admission last time when she was here for CHF. She does not remember having any chest pain and does not feel her shortness of breath is any different than it was at discharge a few days ago.   Past Medical History  Diagnosis Date  . Hypertension   . Atrial fibrillation   . Syncope   . Drug therapy     coumadin  . Pneumonia     History of  . Bronchiectasis     History of     Past Surgical History  Procedure  Laterality Date  . Knee surgery      Left knee aspiration and injection  . Tonsillectomy    . Cesarean section    . Leg surgery      Following a trauma  . Cataract extraction, bilateral    . Hip fracture surgery      Right    Allergies  Allergen Reactions  . Cephalexin Other (See Comments)    "Causes an infection"  . Other     Pain medicine , unsure of which one, used for gout, made her feel loopy  . Penicillins Swelling    I have reviewed the patient's current medications . [START ON 02/13/2014] pantoprazole  40 mg Oral Q0600   . sodium chloride     ondansetron  Prior to Admission medications   Medication Sig Start Date End Date Taking? Authorizing Provider  Bioflavonoid Products (VITAMIN C) CHEW Chew 2 tablets by mouth daily.   Yes Historical Provider, MD  cetirizine (ZYRTEC) 10 MG tablet Take 10 mg by mouth daily.   Yes Historical Provider, MD  digoxin (LANOXIN) 0.125 MG tablet Take 0.125 mg by mouth daily. Take on Monday   Yes Historical Provider, MD  diltiazem (DILACOR XR) 120 MG 24 hr capsule Take 1 capsule (120 mg total) by mouth daily. 02/08/14  Yes Renae Fickle, MD  Fluticasone Furoate-Vilanterol (  BREO ELLIPTA) 200-25 MCG/INH AEPB Inhale 1 Inhaler into the lungs daily as needed (Shortness of Breath).    Yes Historical Provider, MD  furosemide (LASIX) 40 MG tablet Take 1 tablet (40 mg total) by mouth daily. 02/08/14  Yes Renae Fickle, MD  Multiple Vitamin (MULTIVITAMIN) tablet Take 2 tablets by mouth daily.    Yes Historical Provider, MD  sodium chloride (OCEAN) 0.65 % SOLN nasal spray Place 2 sprays into both nostrils at bedtime.   Yes Historical Provider, MD  warfarin (COUMADIN) 5 MG tablet Take 2.5 mg by mouth 4 (four) times a week.   Yes Historical Provider, MD     History   Social History  . Marital Status: Married    Spouse Name: N/A    Number of Children: N/A  . Years of Education: N/A   Occupational History  . [employer] owner of [employer]    Social  History Main Topics  . Smoking status: Never Smoker   . Smokeless tobacco: Never Used  . Alcohol Use: No  . Drug Use: No  . Sexual Activity: Not on file   Other Topics Concern  . Not on file   Social History Narrative   Married, now widowed, lives alone in a house next door to her daughter.     Family Status  Relation Status Death Age  . Mother Deceased 77  . Father Deceased 70    Respitory infection   Family History  Problem Relation Age of Onset  . Heart attack Mother 63  . Pneumonia Father      ROS:  Full 14 point review of systems complete and found to be negative unless listed above.  Physical Exam: Blood pressure 131/69, pulse 96, temperature 98.2 F (36.8 C), temperature source Oral, resp. rate 20, height  (1.575 m), weight 104 lb (47.174 kg), SpO2 96.00%.  General: Well developed, frail, elderly, female in no acute distress Head: Eyes PERRLA, No xanthomas.   Normocephalic and atraumatic, oropharynx without edema or exudate. Dentition: Poor Lungs: Basilar rales Heart: Heart irregular rate and rhythm with S1, S2, 2/6  murmur. pulses are 2+ all 4 extrem.   Neck: No carotid bruits. No lymphadenopathy.  JVD 9 cm. Abdomen: Bowel sounds present, abdomen soft and non-tender without masses or hernias noted. Msk:  No spine or cva tenderness. Generalized weakness, no joint deformities or effusions. Extremities: No clubbing or cyanosis. No edema.  Neuro: Alert and oriented X 3. Left leg is a little weaker than the right, but she can move it. Psych:  Good affect, responds appropriately Skin: No rashes or lesions noted.  Labs:   Lab Results  Component Value Date   WBC 10.9* 02/12/2014   HGB 15.1* 02/12/2014   HCT 43.7 02/12/2014   MCV 94.8 02/12/2014   PLT 178 02/12/2014    Recent Labs  02/12/14 1147  INR 2.24*    Recent Labs Lab 02/12/14 1147  NA 138  K 4.7  CL 96  CO2 33*  BUN 31*  CREATININE 1.22*  CALCIUM 9.4  PROT 8.3  BILITOT 1.0  ALKPHOS 64  ALT  16  AST 24  GLUCOSE 93  ALBUMIN 3.0*    Recent Labs  02/12/14 1153  TROPIPOC 0.13*   Pro B Natriuretic peptide (BNP)  Date/Time Value Ref Range Status  02/12/2014 11:47 AM 2992.0* 0 - 450 pg/mL Final  02/07/2014  3:50 AM 2980.0* 0 - 450 pg/mL Final   Lab Results  Component Value Date   DIGOXIN <0.3*  02/12/2014   Lab Results  Component Value Date   CHOL  Value: 123        08/08/2007   HDL 39* 08/08/2007   LDLCALC  Value: 74        08/08/2007   TRIG 50 08/08/2007   TSH  Date/Time Value Ref Range Status  02/05/2014  4:42 PM 2.060  0.350 - 4.500 uIU/mL Final     Performed at [facility]   Echo: 02/06/2014 Study Conclusions - Left ventricle: The cavity size was normal. Wall thickness was normal. Systolic function was normal. The estimated ejection fraction was in the range of 60% to 65%. Wall motion was normal; there were no regional wall motion abnormalities. Doppler parameters are consistent with a reversible restrictive pattern, indicative of decreased left ventricular diastolic compliance and/or increased left atrial pressure (grade 3 diastolic dysfunction). - Aortic valve: Cusp separation was reduced. Mean gradient (S): 7 mm Hg. - Mitral valve: Calcified annulus. Mildly calcified leaflets . There was mild regurgitation. - Left atrium: The atrium was mildly dilated. - Right atrium: The atrium was mildly dilated. - Atrial septum: There was a small secundum atrial septal defect. - Pulmonary arteries: Systolic pressure was mildly increased. PA peak pressure: 39 mm Hg (S). Impressions: - No change from prior ECHO.  ECG:  02/12/2014 Atrial fib, rate 94, no acute ST changes, no sig change from 09/21 Vent. rate 94 BPM PR interval * ms QRS duration 87 ms QT/QTc 523/654 ms P-R-T axes -1 -39 -1  Radiology:  Dg Chest 2 View 02/12/2014   CLINICAL DATA:  Shortness of breath, weakness. History of hypertension.  EXAM: CHEST  2 VIEW  COMPARISON:  02/05/2014  FINDINGS: The heart  is enlarged. There is perihilar bronchitic change. There has been improvement in aeration at the right lung base. Persistent minimal densities are identified at both lung bases. There are small bilateral pleural effusions.  IMPRESSION: 1. Bronchitic changes. 2. Improved aeration at the right lung base. 3. Persistent minimal atelectasis or resolving infiltrates and small effusions.   Electronically Signed   By: Rosalie Gums M.D.   On: 02/12/2014 11:14   Ct Head Wo Contrast 02/12/2014   CLINICAL DATA:  Weakness  EXAM: CT HEAD WITHOUT CONTRAST  TECHNIQUE: Contiguous axial images were obtained from the base of the skull through the vertex without intravenous contrast.  COMPARISON:  08/12/2007  FINDINGS: There is atrophy and chronic small vessel disease changes. No acute intracranial abnormality. Specifically, no hemorrhage, hydrocephalus, mass lesion, acute infarction, or significant intracranial injury. No acute calvarial abnormality. Visualized paranasal sinuses and mastoids clear. Orbital soft tissues unremarkable.  IMPRESSION: No acute intracranial abnormality.  Atrophy, chronic microvascular disease.   Electronically Signed   By: Charlett Nose M.D.   On: 02/12/2014 11:40    ASSESSMENT AND PLAN:   The patient was seen today by Dr. [place], the patient evaluated and the data reviewed.  Principal Problem:   Left leg weakness - per IM/Neuro  Active Problems:   HYPERTENSION - per IM, SBP 130s now    Atrial fibrillation - rate a little high, dig level very low (?taking), with renal insufficiency, may not be best rx for her. Continue Diltiazem CD 120 mg and adjust PRN for rate control.    CHF (congestive heart failure), NYHA class II - CXR results above, BNP same/slightly higher than previous admit, may have some extra volume on exam, but not in acute distress. No diuresis needed at this time.  CKD (chronic kidney disease), stage III - BUN/Cr at baseline, follow, per IM    Chronic respiratory failure  with hypoxia - not on O2 at home, per IM    Weakness - per IM +/- Neuro     Elevated troponin - POC elevated x 1, continue to cycle and establish trend. Doubt further ischemic evaluation needed. Reconsider if cardiac enzymes go very high or pt develops ischemic symptoms.  Will continue to follow.   SignedTheodore Demark, PA-C 02/12/2014 2:59 PM [phone] [phone]  Co-Sign MD Patient seen and examined and history reviewed. Agree with above findings and plan. Elderly WF recently admitted with acute diastolic heart failure. Was bradycardic during admission and diltiazem was reduced to 120 mg daily. Still on Dig one day a week. Now presents with weakness particularly left leg weakness. No chest pain or dyspnea. Daughter reports confusion about her medications. On exam she does have some left leg weakness. No edema. Weight and BNP are stable. HR is mildly elevated but did no get diltiazem yesterday or today. POC troponin is slightly elevated but no Ecg changes. I would recommend resuming diltiazem at 120 mg daily. Monitor HR response as she is in steady state. If she needs additional rate control we can increase to 180 mg if needed. I would stop digoxin. She is only receiving this once a week and it is not adding anything to her treatment. It will simplify her medication to stop it. We will cycle troponins but I doubt further ischemic work up will be needed. ? If elevated due to neurologic event.  Donaldo Teegarden [place], MDFACC 02/12/2014 4:13 PM

## 2014-02-12 NOTE — ED Notes (Addendum)
Daughter called ems this am for weakness, sts couldn't get out of bed alone, stroke scale negative. Did show some weakness with lifting leg off bed but other wise normal. Weak onset yesterday, pt was seen here Thursday for SOB and CHF

## 2014-02-12 NOTE — ED Notes (Signed)
MD at bedside. Dr. Thompson 

## 2014-02-13 ENCOUNTER — Inpatient Hospital Stay (HOSPITAL_COMMUNITY): Payer: Medicare HMO

## 2014-02-13 DIAGNOSIS — I10 Essential (primary) hypertension: Secondary | ICD-10-CM

## 2014-02-13 DIAGNOSIS — I5032 Chronic diastolic (congestive) heart failure: Secondary | ICD-10-CM

## 2014-02-13 DIAGNOSIS — R29898 Other symptoms and signs involving the musculoskeletal system: Secondary | ICD-10-CM

## 2014-02-13 LAB — CBC
HEMATOCRIT: 40.3 % (ref 36.0–46.0)
HEMOGLOBIN: 13.7 g/dL (ref 12.0–15.0)
MCH: 31.8 pg (ref 26.0–34.0)
MCHC: 34 g/dL (ref 30.0–36.0)
MCV: 93.5 fL (ref 78.0–100.0)
Platelets: 190 10*3/uL (ref 150–400)
RBC: 4.31 MIL/uL (ref 3.87–5.11)
RDW: 15 % (ref 11.5–15.5)
WBC: 12 10*3/uL — AB (ref 4.0–10.5)

## 2014-02-13 LAB — HEMOGLOBIN A1C
Hgb A1c MFr Bld: 5.7 % — ABNORMAL HIGH (ref <5.7)
Mean Plasma Glucose: 117 mg/dL — ABNORMAL HIGH (ref <117)

## 2014-02-13 LAB — LIPID PANEL
CHOL/HDL RATIO: 2.7 ratio
CHOLESTEROL: 118 mg/dL (ref 0–200)
HDL: 44 mg/dL (ref >39)
LDL Cholesterol: 63 mg/dL (ref 0–99)
Triglycerides: 55 mg/dL (ref <150)
VLDL: 11 mg/dL (ref 0–40)

## 2014-02-13 LAB — PROTIME-INR
INR: 2.38 — ABNORMAL HIGH (ref 0.00–1.49)
PROTHROMBIN TIME: 26 s — AB (ref 11.6–15.2)

## 2014-02-13 LAB — BASIC METABOLIC PANEL
ANION GAP: 13 (ref 5–15)
BUN: 30 mg/dL — ABNORMAL HIGH (ref 6–23)
CHLORIDE: 96 meq/L (ref 96–112)
CO2: 25 meq/L (ref 19–32)
Calcium: 8.6 mg/dL (ref 8.4–10.5)
Creatinine, Ser: 1.14 mg/dL — ABNORMAL HIGH (ref 0.50–1.10)
GFR calc Af Amer: 47 mL/min — ABNORMAL LOW (ref >90)
GFR calc non Af Amer: 41 mL/min — ABNORMAL LOW (ref >90)
Glucose, Bld: 106 mg/dL — ABNORMAL HIGH (ref 70–99)
POTASSIUM: 4.7 meq/L (ref 3.7–5.3)
SODIUM: 134 meq/L — AB (ref 137–147)

## 2014-02-13 LAB — TROPONIN I

## 2014-02-13 MED ORDER — FUROSEMIDE 20 MG PO TABS
20.0000 mg | ORAL_TABLET | Freq: Every day | ORAL | Status: DC
  Administered 2014-02-14: 20 mg via ORAL
  Filled 2014-02-13: qty 1

## 2014-02-13 MED ORDER — PREDNISONE 20 MG PO TABS: 20.0000 mg | ORAL_TABLET | Freq: Every day | ORAL | Status: DC

## 2014-02-13 MED ORDER — FUROSEMIDE 20 MG PO TABS: 20.0000 mg | ORAL_TABLET | Freq: Every day | ORAL | Status: DC

## 2014-02-13 MED ORDER — FUROSEMIDE 40 MG PO TABS: 40.0000 mg | ORAL_TABLET | Freq: Every day | ORAL | Status: DC

## 2014-02-13 MED ORDER — ENSURE COMPLETE PO LIQD: 237.0000 mL | Freq: Two times a day (BID) | ORAL | Status: DC

## 2014-02-13 NOTE — Progress Notes (Signed)
CSW Proofreader(Clinical Social Worker) spoke with pt, pt daughter, and pt granddaughter at bedside. CSW notified of PT recommendation. At this time, pt daughter unsure what dc plan will be but stated she would prefer home with home health services. They requested that CSW return in the morning to confirm plan once they have spoken with other family members. CSW full assessment to follow if needed after morning meeting.  Sharada Albornoz, LCSWA [phone]

## 2014-02-13 NOTE — Progress Notes (Signed)
Utilization review completed.  

## 2014-02-13 NOTE — Progress Notes (Signed)
INITIAL NUTRITION ASSESSMENT  DOCUMENTATION CODES Per approved criteria  -Severe malnutrition in the context of chronic illness   INTERVENTION:  Ensure Complete PO BID, each supplement provides 350 kcal and 13 grams of protein  NUTRITION DIAGNOSIS: Malnutrition related to inadequate oral intake as evidenced by severe depletion of muscle mass with 10% weight loss in the past month.  Goal: Intake to meet >90% of estimated nutrition needs.  Monitor:  PO intake, labs, weight trend.  Reason for Assessment: MST  80 y.o. female  Admitting Dx: Left leg weakness  ASSESSMENT: 80 y.o. Female with chronic atrial fibrillation on Coumadin therapy, diastolic CHF, hypertension, recently hospitalized 1-2 weeks ago with acute on chronic diastolic heart failure who presents to the ED with sudden onset of left leg weakness.  Patient reports that she has been eating okay. She endorses some weight loss, but unsure of amount. She states that she usually weights ~112-114 lb Patient agrees to try Ensure supplements to maximize oral intake.  Nutrition Focused Physical Exam:  Subcutaneous Fat:  Orbital Region: mild depletion Upper Arm Region: mild depletion Thoracic and Lumbar Region: NA  Muscle:  Temple Region: moderate depletion Clavicle Bone Region: severe depletion Clavicle and Acromion Bone Region: severe depletion Scapular Bone Region: NA Dorsal Hand: severe depletion Patellar Region: moderate depletion Anterior Thigh Region: moderate depletion Posterior Calf Region: moderate depletion  Edema: none   Height: Ht Readings from Last 1 Encounters:  02/12/14 5\' 2"  (1.575 m)    Weight: Wt Readings from Last 1 Encounters:  02/12/14 104 lb (47.174 kg)    Ideal Body Weight: 50 kg  % Ideal Body Weight: 94%  Wt Readings from Last 10 Encounters:  02/12/14 104 lb (47.174 kg)  02/07/14 104 lb 4.4 oz (47.3 kg)  01/03/14 116 lb 12.8 oz (52.98 kg)  05/25/12 118 lb (53.524 kg)  05/07/11  113 lb (51.256 kg)  05/07/10 112 lb 12 oz (51.143 kg)  03/27/09 117 lb (53.071 kg)    Usual Body Weight: 116 lb 1 month ago  % Usual Body Weight: 90%  BMI:  Body mass index is 19.02 kg/(m^2).  Estimated Nutritional Needs: Kcal: 1400-1600 Protein: 70-80 gm Fluid: 1.4-1.6 L  Skin: WDL  Diet Order: Cardiac  EDUCATION NEEDS: -Education needs addressed   Intake/Output Summary (Last 24 hours) at 02/13/14 1447 Last data filed at 02/12/14 2359  Gross per 24 hour  Intake 558.75 ml  Output    200 ml  Net 358.75 ml    Last BM: PTA   Labs:   Recent Labs Lab 02/08/14 0423 02/12/14 1147 02/13/14 0228  NA 140 138 134*  K 4.2 4.7 4.7  CL 98 96 96  CO2 34* 33* 25  BUN 41* 31* 30*  CREATININE 1.54* 1.22* 1.14*  CALCIUM 8.8 9.4 8.6  GLUCOSE 98 93 106*    CBG (last 3)   Recent Labs  02/12/14 1115  GLUCAP 95    Scheduled Meds: . diltiazem  120 mg Oral Daily  . Influenza vac split quadrivalent PF  0.5 mL Intramuscular Tomorrow-1000  . loratadine  10 mg Oral Daily  . multivitamin with minerals  2 tablet Oral Daily  . pantoprazole  40 mg Oral Q0600  . sodium chloride  2 spray Each Nare QHS  . warfarin  2.5 mg Oral Once per day on Sun Tue Wed Sat  . Warfarin - Pharmacist Dosing Inpatient   Does not apply q1800    Continuous Infusions:   Past Medical History  Diagnosis  Date  . Hypertension   . Atrial fibrillation   . Syncope   . Drug therapy     coumadin  . Bronchiectasis     History of  . Pneumonia "lots of times"  . Arthritis     "back" (02/12/2014)  . On home oxygen therapy     "prn for the last week" (02/12/2014)    Past Surgical History  Procedure Laterality Date  . Knee arthrocentesis Left 07/2007    and injection  . Tonsillectomy    . Cesarean section  1958  . Femur fracture surgery Right     Following a trauma  . Cataract extraction, bilateral Bilateral   . Fracture surgery    . Hip fracture surgery Right     pt denies this hx on  02/12/2014 "It was my leg, not my hip"    Joaquin CourtsKimberly Harris, RD, LDN, CNSC Pager [phone] After Hours Pager [phone]

## 2014-02-13 NOTE — Progress Notes (Signed)
[facility] PROGRESS NOTE  [facility]angel ZOX:[id number] DOB: 1920-09-30 DOA: 02/12/2014 PCP: Pamelia HoitWILSON,FRED HENRY, MD  Assessment/Plan: #1 left-sided weakness Questionable etiology. MRI of the head was negative for acute infarction. CT head negative. Patient with some improvement with the left-sided weakness. Patient has been seen by neurology and no further stroke workup needed at this time. X-ray of the lumbar spine with mild compression/wedge deformity of L1 vertebral body which is chronic. Mild rightward scoliosis. Osteopenia. No acute findings. PT/OT. Likely needs SNF.  #2 elevated troponins Point-of-care troponin was elevated. Troponins that were cycled were negative x3. Patient has been seen by cardiology. Digoxin has been discontinued. Patient has been continued on lower dose of diltiazem 120 mg daily. Per cardiology no further ischemic workup needed at this time.  #3 atrial fibrillation Continue diltiazem CD 120 mg daily for rate control and adjust as needed. Digoxin has been discontinued per cardiology. Coumadin for anticoagulation.  #4 chronic kidney disease stage III Stable at baseline.  #5 chronic respiratory failure with hypoxia on O2 at home per family Stable.  #6 chronic diastolic CHF class II BMP essentially unchanged. No need of IV diuretics at this time per cardiology. Resume home dose Lasix.  #7 prophylaxis Coumadin for DVT prophylaxis.  Code Status: DO NOT RESUSCITATE Family Communication: Updated patient no family at bedside. Disposition Plan: Skilled nursing facility when medically stable.   Consultants:  Neurology: Dr. Thad Rangereynolds 02/04/2014  Cardiology: Dr. SwazilandJordan 02/12/2014  Procedures:  MRI/MRA head 02/12/2014  X-ray of the L-spine 02/13/2014  Chest x-ray 02/12/2014  CT head 02/12/2014  Antibiotics:  None  HPI/Subjective: Patient states able to raise left leg. Patient unable to ambulate. Physical therapy today. Patient did transfers with  moderate assistance.  Objective: Filed Vitals:   02/13/14 0755  BP: 141/84  Pulse: 91  Temp: 98.2 F (36.8 C)  Resp: 16    Intake/Output Summary (Last 24 hours) at 02/13/14 1512 Last data filed at 02/12/14 2359  Gross per 24 hour  Intake 558.75 ml  Output    200 ml  Net 358.75 ml   Filed Weights   02/12/14 1016 02/12/14 1453  Weight: 47.174 kg (104 lb) 47.174 kg (104 lb)    Exam:   General:  NAD  Cardiovascular: RRR  Respiratory: CTAB  Abdomen: Soft, nontender, nondistended, positive bowel sounds  Musculoskeletal: No clubbing cyanosis or edema. 4/5 BLE strenght, 5/5 BUE strength  Data Reviewed: Basic Metabolic Panel:  Recent Labs Lab 02/07/14 0350 02/08/14 0423 02/12/14 1147 02/13/14 0228  NA 141 140 138 134*  K 4.4 4.2 4.7 4.7  CL 100 98 96 96  CO2 31 34* 33* 25  GLUCOSE 105* 98 93 106*  BUN 45* 41* 31* 30*  CREATININE 1.68* 1.54* 1.22* 1.14*  CALCIUM 9.1 8.8 9.4 8.6   Liver Function Tests:  Recent Labs Lab 02/12/14 1147  AST 24  ALT 16  ALKPHOS 64  BILITOT 1.0  PROT 8.3  ALBUMIN 3.0*   No results found for this basename: LIPASE, AMYLASE,  in the last 168 hours No results found for this basename: AMMONIA,  in the last 168 hours CBC:  Recent Labs Lab 02/12/14 1147 02/13/14 0228  WBC 10.9* 12.0*  HGB 15.1* 13.7  HCT 43.7 40.3  MCV 94.8 93.5  PLT 178 190   Cardiac Enzymes:  Recent Labs Lab 02/12/14 1530 02/12/14 2024 02/13/14 0222  TROPONINI <0.30 <0.30 <0.30   BNP (last 3 results)  Recent Labs  02/05/14 1350 02/07/14 0350 02/12/14 1147  PROBNP 3066.0* 2980.0* 2992.0*   CBG:  Recent Labs Lab 02/12/14 1115  GLUCAP 95    No results found for this or any previous visit (from the past 240 hour(s)).   Studies: Dg Chest 2 View  02/12/2014   CLINICAL DATA:  Shortness of breath, weakness. History of hypertension.  EXAM: CHEST  2 VIEW  COMPARISON:  02/05/2014  FINDINGS: The heart is enlarged. There is perihilar  bronchitic change. There has been improvement in aeration at the right lung base. Persistent minimal densities are identified at both lung bases. There are small bilateral pleural effusions.  IMPRESSION: 1. Bronchitic changes. 2. Improved aeration at the right lung base. 3. Persistent minimal atelectasis or resolving infiltrates and small effusions.   Electronically Signed   By: Rosalie Gums M.D.   On: 02/12/2014 11:14   Dg Lumbar Spine Complete  02/13/2014   CLINICAL DATA:  Left leg weakness.  Low back pain.  No injury.  EXAM: LUMBAR SPINE - COMPLETE 4+ VIEW  COMPARISON:  None.  FINDINGS: Diffuse degenerative disc disease and facet disease throughout the lumbar spine. Diffuse osteopenia. Mild compression an wedge deformity of L1 appears chronic. No subluxation. SI joints are symmetric and unremarkable. Mild rightward scoliosis of the lumbar spine.  IMPRESSION: Mild compression/ wedge deformity of the L1 vertebral body which appears chronic.  Mild rightward scoliosis. Diffuse degenerative disc and facet disease.  Osteopenia.  No acute findings.   Electronically Signed   By: Charlett Nose M.D.   On: 02/13/2014 13:04   Ct Head Wo Contrast  02/12/2014   CLINICAL DATA:  Weakness  EXAM: CT HEAD WITHOUT CONTRAST  TECHNIQUE: Contiguous axial images were obtained from the base of the skull through the vertex without intravenous contrast.  COMPARISON:  08/12/2007  FINDINGS: There is atrophy and chronic small vessel disease changes. No acute intracranial abnormality. Specifically, no hemorrhage, hydrocephalus, mass lesion, acute infarction, or significant intracranial injury. No acute calvarial abnormality. Visualized paranasal sinuses and mastoids clear. Orbital soft tissues unremarkable.  IMPRESSION: No acute intracranial abnormality.  Atrophy, chronic microvascular disease.   Electronically Signed   By: Charlett Nose M.D.   On: 02/12/2014 11:40   Mr Brain Wo Contrast  02/12/2014   CLINICAL DATA:  80 year old  hypertensive female presenting with left leg weakness. Initial encounter.  EXAM: MRI HEAD WITHOUT CONTRAST  MRA HEAD WITHOUT CONTRAST  TECHNIQUE: Multiplanar, multiecho pulse sequences of the brain and surrounding structures were obtained without intravenous contrast. Angiographic images of the head were obtained using MRA technique without contrast.  COMPARISON:  02/04/2013 CT.  08/13/2007 MR.  FINDINGS: MRI HEAD FINDINGS  No acute infarct.  No intracranial hemorrhage.  Prominent small vessel disease type changes.  Global atrophy. Ventricular prominence felt to be related to atrophy rather than hydrocephalus.  No intracranial mass lesion noted on this unenhanced exam.  Cervical spondylotic changes with spinal stenosis and mild cord flattening C4-5 and C5-6. Transverse ligament hypertrophy.  Cervical medullary junction, pituitary region, pineal region and orbital structures unremarkable.  MRA HEAD FINDINGS  Mild atherosclerotic type changes of the cavernous segment of the internal carotid artery bilaterally with areas of mild narrowing and ectasia.  Slightly prominent infundibulum at the level of the posterior communicating artery origin bilaterally without discrete saccular aneurysm.  Mild to moderate focal narrowing of the A1 segment of the right anterior cerebral artery.  Mild middle cerebral artery branch vessel irregularity bilaterally.  Mild narrowing distal right vertebral artery.  Non visualization posterior inferior cerebellar arteries  bilaterally and left anterior inferior cerebellar artery.  Mild ectasia basilar artery without high-grade stenosis.  Mild to moderate narrowing portions of the right posterior cerebral artery.  IMPRESSION: MRI HEAD:  No acute infarct.  Prominent small vessel disease type changes.  Global atrophy.  Cervical spondylotic changes with spinal stenosis and mild cord flattening C4-5 and C5-6.  MRA HEAD:  Intracranial atherosclerotic type changes most notable involving branch vessels  as noted above.   Electronically Signed   By: Bridgett Larsson M.D.   On: 02/12/2014 20:28   Mr Maxine Glenn Head/brain Wo Cm  02/12/2014   CLINICAL DATA:  80 year old hypertensive female presenting with left leg weakness. Initial encounter.  EXAM: MRI HEAD WITHOUT CONTRAST  MRA HEAD WITHOUT CONTRAST  TECHNIQUE: Multiplanar, multiecho pulse sequences of the brain and surrounding structures were obtained without intravenous contrast. Angiographic images of the head were obtained using MRA technique without contrast.  COMPARISON:  02/04/2013 CT.  08/13/2007 MR.  FINDINGS: MRI HEAD FINDINGS  No acute infarct.  No intracranial hemorrhage.  Prominent small vessel disease type changes.  Global atrophy. Ventricular prominence felt to be related to atrophy rather than hydrocephalus.  No intracranial mass lesion noted on this unenhanced exam.  Cervical spondylotic changes with spinal stenosis and mild cord flattening C4-5 and C5-6. Transverse ligament hypertrophy.  Cervical medullary junction, pituitary region, pineal region and orbital structures unremarkable.  MRA HEAD FINDINGS  Mild atherosclerotic type changes of the cavernous segment of the internal carotid artery bilaterally with areas of mild narrowing and ectasia.  Slightly prominent infundibulum at the level of the posterior communicating artery origin bilaterally without discrete saccular aneurysm.  Mild to moderate focal narrowing of the A1 segment of the right anterior cerebral artery.  Mild middle cerebral artery branch vessel irregularity bilaterally.  Mild narrowing distal right vertebral artery.  Non visualization posterior inferior cerebellar arteries bilaterally and left anterior inferior cerebellar artery.  Mild ectasia basilar artery without high-grade stenosis.  Mild to moderate narrowing portions of the right posterior cerebral artery.  IMPRESSION: MRI HEAD:  No acute infarct.  Prominent small vessel disease type changes.  Global atrophy.  Cervical spondylotic  changes with spinal stenosis and mild cord flattening C4-5 and C5-6.  MRA HEAD:  Intracranial atherosclerotic type changes most notable involving branch vessels as noted above.   Electronically Signed   By: Bridgett Larsson M.D.   On: 02/12/2014 20:28    Scheduled Meds: . diltiazem  120 mg Oral Daily  . feeding supplement (ENSURE COMPLETE)  237 mL Oral BID BM  . Influenza vac split quadrivalent PF  0.5 mL Intramuscular Tomorrow-1000  . loratadine  10 mg Oral Daily  . multivitamin with minerals  2 tablet Oral Daily  . pantoprazole  40 mg Oral Q0600  . sodium chloride  2 spray Each Nare QHS  . warfarin  2.5 mg Oral Once per day on Sun Tue Wed Sat  . Warfarin - Pharmacist Dosing Inpatient   Does not apply q1800   Continuous Infusions:   Principal Problem:   Left leg weakness Active Problems:   HYPERTENSION   Atrial fibrillation   CHF (congestive heart failure), NYHA class II   CKD (chronic kidney disease), stage III   Chronic respiratory failure with hypoxia   Weakness   Elevated troponin    Time spent: 40 mins    [facility] MD [facility] Pager [phone]. If 7PM-7AM, please contact night-coverage at [email or web address], password [facility] 02/13/2014, 3:12 PM  LOS: 1 day

## 2014-02-13 NOTE — Discharge Instructions (Signed)
Information on my medicine - Coumadin®   (Warfarin) ° °This medication education was reviewed with me or my healthcare representative as part of my discharge preparation.  The pharmacist that spoke with me during my hospital stay was:  Heddy Vidana Stillinger, RPH ° °Why was Coumadin prescribed for you? °Coumadin was prescribed for you because you have a blood clot or a medical condition that can cause an increased risk of forming blood clots. Blood clots can cause serious health problems by blocking the flow of blood to the heart, lung, or brain. Coumadin can prevent harmful blood clots from forming. °As a reminder your indication for Coumadin is:   Stroke Prevention Because Of Atrial Fibrillation ° °What test will check on my response to Coumadin? °While on Coumadin (warfarin) you will need to have an INR test regularly to ensure that your dose is keeping you in the desired range. The INR (international normalized ratio) number is calculated from the result of the laboratory test called prothrombin time (PT). ° °If an INR APPOINTMENT HAS NOT ALREADY BEEN MADE FOR YOU please schedule an appointment to have this lab work done by your health care provider within 7 days. °Your INR goal is usually a number between:  2 to 3 or your provider may give you a more narrow range like 2-2.5.  Ask your health care provider during an office visit what your goal INR is. ° °What  do you need to  know  About  COUMADIN? °Take Coumadin (warfarin) exactly as prescribed by your healthcare provider about the same time each day.  DO NOT stop taking without talking to the doctor who prescribed the medication.  Stopping without other blood clot prevention medication to take the place of Coumadin may increase your risk of developing a new clot or stroke.  Get refills before you run out. ° °What do you do if you miss a dose? °If you miss a dose, take it as soon as you remember on the same day then continue your regularly scheduled  regimen the next day.  Do not take two doses of Coumadin at the same time. ° °Important Safety Information °A possible side effect of Coumadin (Warfarin) is an increased risk of bleeding. You should call your healthcare provider right away if you experience any of the following: °  Bleeding from an injury or your nose that does not stop. °  Unusual colored urine (red or dark brown) or unusual colored stools (red or black). °  Unusual bruising for unknown reasons. °  A serious fall or if you hit your head (even if there is no bleeding). ° °Some foods or medicines interact with Coumadin® (warfarin) and might alter your response to warfarin. To help avoid this: °  Eat a balanced diet, maintaining a consistent amount of Vitamin K. °  Notify your provider about major diet changes you plan to make. °  Avoid alcohol or limit your intake to 1 drink for women and 2 drinks for men per day. °(1 drink is 5 oz. wine, 12 oz. beer, or 1.5 oz. liquor.) ° °Make sure that ANY health care provider who prescribes medication for you knows that you are taking Coumadin (warfarin).  Also make sure the healthcare provider who is monitoring your Coumadin knows when you have started a new medication including herbals and non-prescription products. ° °Coumadin® (Warfarin)  Major Drug Interactions  °Increased Warfarin Effect Decreased Warfarin Effect  °Alcohol (large quantities) °Antibiotics (esp. Septra/Bactrim, Flagyl, Cipro) °Amiodarone (Cordarone) °Aspirin (  ASA) °Cimetidine (Tagamet) °Megestrol (Megace) °NSAIDs (ibuprofen, naproxen, etc.) °Piroxicam (Feldene) °Propafenone (Rythmol SR) °Propranolol (Inderal) °Isoniazid (INH) °Posaconazole (Noxafil) Barbiturates (Phenobarbital) °Carbamazepine (Tegretol) °Chlordiazepoxide (Librium) °Cholestyramine (Questran) °Griseofulvin °Oral Contraceptives °Rifampin °Sucralfate (Carafate) °Vitamin K  ° °Coumadin® (Warfarin) Major Herbal Interactions  °Increased Warfarin Effect Decreased Warfarin Effect    °Garlic °Ginseng °Ginkgo biloba Coenzyme Q10 °Green tea °St. John’s wort   ° °Coumadin® (Warfarin) FOOD Interactions  °Eat a consistent number of servings per week of foods HIGH in Vitamin K °(1 serving = ½ cup)  °Collards (cooked, or boiled & drained) °Kale (cooked, or boiled & drained) °Mustard greens (cooked, or boiled & drained) °Parsley *serving size only = ¼ cup °Spinach (cooked, or boiled & drained) °Swiss chard (cooked, or boiled & drained) °Turnip greens (cooked, or boiled & drained)  °Eat a consistent number of servings per week of foods MEDIUM-HIGH in Vitamin K °(1 serving = 1 cup)  °Asparagus (cooked, or boiled & drained) °Broccoli (cooked, boiled & drained, or raw & chopped) °Brussel sprouts (cooked, or boiled & drained) *serving size only = ½ cup °Lettuce, raw (green leaf, endive, romaine) °Spinach, raw °Turnip greens, raw & chopped  ° °These websites have more information on Coumadin (warfarin):  www.coumadin.com; °www.ahrq.gov/consumer/coumadin.htm; ° ° ° °

## 2014-02-13 NOTE — Progress Notes (Signed)
ANTICOAGULATION CONSULT NOTE - Initial Consult  Pharmacy Consult for Coumadin Indication: atrial fibrillation  Allergies  Allergen Reactions  . Cephalexin Other (See Comments)    "Causes an infection"  . Other     Pain medicine , unsure of which one, used for gout, made her feel loopy  . Penicillins Swelling    Patient Measurements: Height: 5\' 2"  (157.5 cm) Weight: 104 lb (47.174 kg) IBW/kg (Calculated) : 50.1 Heparin Dosing Weight:    Vital Signs: Temp: 98.2 F (36.8 C) (09/29 0755) Temp src: Oral (09/29 0755) BP: 141/84 mmHg (09/29 0755) Pulse Rate: 91 (09/29 0755)  Labs:  Recent Labs  02/12/14 1147 02/12/14 1530 02/12/14 2024 02/13/14 0222 02/13/14 0228  HGB 15.1*  --   --   --  13.7  HCT 43.7  --   --   --  40.3  PLT 178  --   --   --  190  LABPROT 24.8*  --   --   --  26.0*  INR 2.24*  --   --   --  2.38*  CREATININE 1.22*  --   --   --  1.14*  TROPONINI  --  <0.30 <0.30 <0.30  --     Estimated Creatinine Clearance: 24 ml/min (by C-G formula based on Cr of 1.14).   Medical History: Past Medical History  Diagnosis Date  . Hypertension   . Atrial fibrillation   . Syncope   . Drug therapy     coumadin  . Bronchiectasis     History of  . Pneumonia "lots of times"  . Arthritis     "back" (02/12/2014)  . On home oxygen therapy     "prn for the last week" (02/12/2014)    Medications:  Prescriptions prior to admission  Medication Sig Dispense Refill  . Bioflavonoid Products (VITAMIN C) CHEW Chew 2 tablets by mouth daily.      . cetirizine (ZYRTEC) 10 MG tablet Take 10 mg by mouth daily.      . digoxin (LANOXIN) 0.125 MG tablet Take 0.125 mg by mouth daily. Take on Monday      . diltiazem (DILACOR XR) 120 MG 24 hr capsule Take 1 capsule (120 mg total) by mouth daily.  30 capsule  0  . Fluticasone Furoate-Vilanterol (BREO ELLIPTA) 200-25 MCG/INH AEPB Inhale 1 Inhaler into the lungs daily as needed (Shortness of Breath).       . furosemide (LASIX) 40  MG tablet Take 1 tablet (40 mg total) by mouth daily.  30 tablet  0  . Multiple Vitamin (MULTIVITAMIN) tablet Take 2 tablets by mouth daily.       . sodium chloride (OCEAN) 0.65 % SOLN nasal spray Place 2 sprays into both nostrils at bedtime.      Marland Kitchen warfarin (COUMADIN) 5 MG tablet Take 2.5 mg by mouth 4 (four) times a week.        Assessment: Weakness 80 y/o F with CAF, CHF, and HTN presents with weakness. Patient recently discharged for [facility] on 9/24 for CHF exac.   Anticoagulation: CAF on Coumadin PTA. INR 2.38. Hgb 15.1>>13.7. Plts stable.  Cardiovascular: CHF, CAF, HTN. Troponin 0.13. proBNP 2992. VSS. Meds: diltiazem  Endocrinology: Glucose 106  Gastrointestinal / Nutrition: LFT's WNL. Po PPI, po MV  Neurology:  Nephrology: CKD. Scr 1.14  Pulmonary: bronchiectasis. Claritin  Hematology / Oncology: Hgb WNL but down to 13.7  PTA Medication Issues: Vit C, Dig, Breo Ellipta,Lasix   Goal of Therapy:  INR  2-3 Monitor platelets by anticoagulation protocol: Yes   Plan:  Resume Coumadin 2.5mg  T,W,SS Daily INR   Ahmiya Abee S. Merilynn Finlandobertson, PharmD, BCPS Clinical Staff Pharmacist Pager [phone]  Misty Stanleyobertson, Shaheed Schmuck Stillinger 02/13/2014,9:38 AM

## 2014-02-13 NOTE — Evaluation (Addendum)
Physical Therapy Evaluation Patient Details Name: Candice Rangel MRN: [id number] DOB: 09-17-1920 Today's Date: 02/13/2014   History of Present Illness  With history of chronic atrial fibrillation on Coumadin therapy, diastolic CHF, hypertension, recently hospitalized 1 weeks ago with d/c 9/24 acute on chronic diastolic heart failure who presents to the ED with sudden onset of left leg weakness  Clinical Impression  Pt pleasant but very weak and debilitated. Pt was walking 200' on D/C 9/24 and today is mod assist for getting OOB and pivoting to chair. Pt lives alone with dgtr next door and unless pt able to achieve supervision level or dgtr can provide mod assist with transfers do not recommend return home at this time but ST-SNF. Pt will benefit from acute therapy to maximize mobility, balance, strength and function to decrease burden of care and return pt to PLOF. Pt needing to void after transfer to chair and with transfer to chair pt incontinent with assist for managing briefs but was able to complete pericare seated without assist.    Follow Up Recommendations SNF;Supervision/Assistance - 24 hour    Equipment Recommendations  3in1 (PT)    Recommendations for Other Services       Precautions / Restrictions Precautions Precautions: Fall Restrictions Weight Bearing Restrictions: No      Mobility  Bed Mobility Overal bed mobility: Needs Assistance Bed Mobility: Rolling;Sidelying to Sit Rolling: Mod assist Sidelying to sit: Mod assist       General bed mobility comments: increased time, max cues for sequence and assist to bend knee and rotate pelvis  Transfers Overall transfer level: Needs assistance   Transfers: Sit to/from Stand;Stand Pivot Transfers Sit to Stand: Mod assist Stand pivot transfers: Mod assist       General transfer comment: cues for hand placement, assist for anterior translation with pivot x 3 bed>chair<> BSC  Ambulation/Gait Ambulation/Gait  assistance:  (pt unable)              Stairs            Wheelchair Mobility    Modified Rankin (Stroke Patients Only)       Balance Overall balance assessment: Needs assistance   Sitting balance-Leahy Scale: Fair       Standing balance-Leahy Scale: Poor                               Pertinent Vitals/Pain Pain Assessment: 0-10 Pain Score: 3  Pain Location: generalized Pain Descriptors / Indicators: Aching Pain Intervention(s): Repositioned    Home Living Family/patient expects to be discharged to:: Private residence Living Arrangements: Alone Available Help at Discharge: Family;Available PRN/intermittently Type of Home: House Home Access: Level entry     Home Layout: One level Home Equipment: Walker - 2 wheels      Prior Function Level of Independence: Independent with assistive device(s);Needs assistance   Gait / Transfers Assistance Needed: pt reports that since D/C from [facility] she has needed assist for mobiltiy from dgtr but dgtr isn't staying 24hrs and lives next door. pt states she has been using RW           Hand Dominance        Extremity/Trunk Assessment   Upper Extremity Assessment: Generalized weakness           Lower Extremity Assessment: Generalized weakness (bil LE equal with hip flexion 4/5, knee flexion and extension 3/5)      Cervical / Trunk Assessment: Kyphotic  Communication   Communication: HOH  Cognition Arousal/Alertness: Awake/alert Behavior During Therapy: Flat affect Overall Cognitive Status: Impaired/Different from baseline Area of Impairment: Safety/judgement         Safety/Judgement: Decreased awareness of safety;Decreased awareness of deficits     General Comments: pt with very limited mobility but still stating she could go home alone    General Comments      Exercises        Assessment/Plan    PT Assessment Patient needs continued PT services  PT Diagnosis Difficulty  walking;Generalized weakness   PT Problem List Decreased strength;Decreased cognition;Decreased activity tolerance;Decreased balance;Decreased safety awareness;Decreased knowledge of use of DME;Decreased skin integrity;Decreased mobility  PT Treatment Interventions Gait training;Functional mobility training;Therapeutic activities;Therapeutic exercise;Patient/family education;DME instruction;Balance training   PT Goals (Current goals can be found in the Care Plan section) Acute Rehab PT Goals Patient Stated Goal: return home PT Goal Formulation: With patient Time For Goal Achievement: 02/27/14 Potential to Achieve Goals: Fair    Frequency Min 3X/week   Barriers to discharge Decreased caregiver support      Co-evaluation               End of Session   Activity Tolerance: Patient limited by fatigue Patient left: in chair;with call bell/phone within reach;with chair alarm set Nurse Communication: Mobility status;Precautions         Time: [id number] PT Time Calculation (min): 22 min   Charges:   PT Evaluation $Initial PT Evaluation Tier I: 1 Procedure PT Treatments $Therapeutic Activity: 8-22 mins   PT G Codes:          Delorse Lekabor, Alonnah Lampkins Beth 02/13/2014, 9:42 AM Delaney MeigsMaija Tabor Vartan Kerins, PT [phone]

## 2014-02-13 NOTE — Evaluation (Signed)
Speech Language Pathology Evaluation Patient Details Name: Candice Rangel MRN: [id number] DOB: [birth year] Today's Date: 02/13/2014 Time: 1455-1510 SLP Time Calculation (min): 15 min  Problem List:  Patient Active Problem List   Diagnosis Date Noted  . Weakness 02/12/2014  . Elevated troponin 02/12/2014  . Left leg weakness 02/12/2014  . Interstitial lung disease 02/08/2014  . Chronic respiratory failure with hypoxia 02/08/2014  . CKD (chronic kidney disease), stage III 02/06/2014  . Acute on chronic diastolic CHF (congestive heart failure) 02/05/2014  . CHF (congestive heart failure), NYHA class II 02/05/2014  . 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 Dyspnea 05/25/2012  . PATENT FORAMEN OVALE 03/27/2009  . HYPERTENSION 03/25/2009  . Atrial fibrillation 03/25/2009  . SYNCOPE 03/25/2009   Past Medical History:  Past Medical History  Diagnosis Date  . Hypertension   . Atrial fibrillation   . Syncope   . Drug therapy     coumadin  . Bronchiectasis     History of  . Pneumonia "lots of times"  . Arthritis     "back" (02/12/2014)  . On home oxygen therapy     "prn for the last week" (02/12/2014)   Past Surgical History:  Past Surgical History  Procedure Laterality Date  . Knee arthrocentesis Left 07/2007    and injection  . Tonsillectomy    . Cesarean section  1958  . Femur fracture surgery Right     Following a trauma  . Cataract extraction, bilateral Bilateral   . Fracture surgery    . Hip fracture surgery Right     pt denies this hx on 02/12/2014 "It was my leg, not my hip"   HPI:  With history of chronic atrial fibrillation on Coumadin therapy, diastolic CHF, hypertension, recently hospitalized 1 weeks ago with d/c 9/24 acute on chronic diastolic heart failure who presents to the ED with sudden onset of left leg weakness. MRI negative for acute infarct.   Assessment / Plan / Recommendation Clinical Impression  Pt and granddaughter describe slurred speech, with no further  cognitive-linguistic changes at this time, and MRI was negative for acute infarct. Speech shows evidence of a mild dysarthria, with pt able to communicate at the conversational level at this time. No acute SLP f/u recommended at this time, however should dysarthria persist, pt would benefit from SLP f/u at next venue of care [facility]    SLP Assessment  All further Speech Lanaguage Pathology  needs can be addressed in the next venue of care    Follow Up Recommendations  Skilled Nursing facility;Home health SLP    Frequency and Duration        Pertinent Vitals/Pain Pain Assessment: No/denies pain   SLP Goals  Patient/Family Stated Goal: wants to sleep  SLP Evaluation Prior Functioning  Cognitive/Linguistic Baseline: Within functional limits Type of Home: House Available Help at Discharge: Family;Available PRN/intermittently   Cognition  Overall Cognitive Status: Within Functional Limits for tasks assessed (pt/family say she is at baseline)    Comprehension  Auditory Comprehension Overall Auditory Comprehension: Appears within functional limits for tasks assessed Visual Recognition/Discrimination Discrimination: Not tested Reading Comprehension Reading Status: Not tested    Expression Expression Primary Mode of Expression: Verbal Verbal Expression Overall Verbal Expression: Appears within functional limits for tasks assessed Written Expression Written Expression: Not tested   Oral / Motor Oral Motor/Sensory Function Overall Oral Motor/Sensory Function: Appears within functional limits for tasks assessed Motor Speech Overall Motor Speech: Impaired Respiration: Within functional limits Phonation: Normal Resonance: Within  functional limits Articulation: Impaired Level of Impairment: Conversation Intelligibility: Intelligible Motor Planning: Witnin functional limits Motor Speech Errors: Not applicable   GO Functional Assessment Tool Used: skilled clinical  judgment Functional Limitations: Motor speech Motor Speech Current Status [phone]): At least 1 percent but less than 20 percent impaired, limited or restricted Motor Speech Goal Status [phone]): At least 1 percent but less than 20 percent impaired, limited or restricted Motor Speech Goal Status [phone]): At least 1 percent but less than 20 percent impaired, limited or restricted     Maxcine Ham, M.A. CCC-SLP [phone]  Maxcine Ham 02/13/2014, 3:16 PM

## 2014-02-13 NOTE — Progress Notes (Signed)
SUBJECTIVE:  No complaints of CP or SOB  OBJECTIVE:   Vitals:   Filed Vitals:   02/13/14 0001 02/13/14 0200 02/13/14 0400 02/13/14 0755  BP: 126/68 138/61 146/67 141/84  Pulse: 93 101 108 91  Temp:   100 F (37.8 C) 98.2 F (36.8 C)  TempSrc:    Oral  Resp: 16  16 16   Height:      Weight:      SpO2: 93% 91% 93% 96%   I&O's:   Intake/Output Summary (Last 24 hours) at 02/13/14 6045 Last data filed at 02/12/14 2359  Gross per 24 hour  Intake 558.75 ml  Output    200 ml  Net 358.75 ml   TELEMETRY: Reviewed telemetry pt in NSR:     PHYSICAL EXAM General: Well developed, well nourished, in no acute distress Head: Eyes PERRLA, No xanthomas.   Normal cephalic and atramatic  Lungs:   Clear bilaterally to auscultation and percussion. Heart:   HRRR S1 S2 Pulses are 2+ & equal. Abdomen: Bowel sounds are positive, abdomen soft and non-tender without masses Extremities:   No clubbing, cyanosis or edema.  DP +1 Neuro: Alert and oriented X 3. Psych:  Good affect, responds appropriately   LABS: Basic Metabolic Panel:  Recent Labs  40/98/11 1147 02/13/14 0228  NA 138 134*  K 4.7 4.7  CL 96 96  CO2 33* 25  GLUCOSE 93 106*  BUN 31* 30*  CREATININE 1.22* 1.14*  CALCIUM 9.4 8.6   Liver Function Tests:  Recent Labs  02/12/14 1147  AST 24  ALT 16  ALKPHOS 64  BILITOT 1.0  PROT 8.3  ALBUMIN 3.0*   No results found for this basename: LIPASE, AMYLASE,  in the last 72 hours CBC:  Recent Labs  02/12/14 1147 02/13/14 0228  WBC 10.9* 12.0*  HGB 15.1* 13.7  HCT 43.7 40.3  MCV 94.8 93.5  PLT 178 190   Cardiac Enzymes:  Recent Labs  02/12/14 1530 02/12/14 2024 02/13/14 0222  TROPONINI <0.30 <0.30 <0.30   BNP: No components found with this basename: POCBNP,  D-Dimer: No results found for this basename: DDIMER,  in the last 72 hours Hemoglobin A1C: No results found for this basename: HGBA1C,  in the last 72 hours Fasting Lipid Panel:  Recent Labs  02/13/14 0228  CHOL 118  HDL 44  LDLCALC 63  TRIG 55  CHOLHDL 2.7   Thyroid Function Tests: No results found for this basename: TSH, T4TOTAL, FREET3, T3FREE, THYROIDAB,  in the last 72 hours Anemia Panel: No results found for this basename: VITAMINB12, FOLATE, FERRITIN, TIBC, IRON, RETICCTPCT,  in the last 72 hours Coag Panel:   Lab Results  Component Value Date   INR 2.38* 02/13/2014   INR 2.24* 02/12/2014   INR 3.65* 02/08/2014    RADIOLOGY: Dg Chest 2 View  02/12/2014   CLINICAL DATA:  Shortness of breath, weakness. History of hypertension.  EXAM: CHEST  2 VIEW  COMPARISON:  02/05/2014  FINDINGS: The heart is enlarged. There is perihilar bronchitic change. There has been improvement in aeration at the right lung base. Persistent minimal densities are identified at both lung bases. There are small bilateral pleural effusions.  IMPRESSION: 1. Bronchitic changes. 2. Improved aeration at the right lung base. 3. Persistent minimal atelectasis or resolving infiltrates and small effusions.   Electronically Signed   By: Rosalie Gums M.D.   On: 02/12/2014 11:14   Dg Chest 2 View  02/05/2014   CLINICAL DATA:  Shortness of breath.  Hypertension.  EXAM: CHEST  2 VIEW  COMPARISON:  06/27/2008.  FINDINGS: The heart remains grossly normal in size. No significant change in linear density and small amount of pleural fluid at the left lung base. Increased patchy density in pleural fluid at the right lung base. Stable mildly prominent interstitial markings. Diffuse osteopenia. Atheromatous arterial calcifications.  IMPRESSION: 1. Bibasilar atelectasis and possible pneumonia with bilateral pleural effusions, greater on the right. 2. Stable chronic interstitial lung disease.   Electronically Signed   By: Gordan Payment M.D.   On: 02/05/2014 12:05   Ct Head Wo Contrast  02/12/2014   CLINICAL DATA:  Weakness  EXAM: CT HEAD WITHOUT CONTRAST  TECHNIQUE: Contiguous axial images were obtained from the base of the skull  through the vertex without intravenous contrast.  COMPARISON:  08/12/2007  FINDINGS: There is atrophy and chronic small vessel disease changes. No acute intracranial abnormality. Specifically, no hemorrhage, hydrocephalus, mass lesion, acute infarction, or significant intracranial injury. No acute calvarial abnormality. Visualized paranasal sinuses and mastoids clear. Orbital soft tissues unremarkable.  IMPRESSION: No acute intracranial abnormality.  Atrophy, chronic microvascular disease.   Electronically Signed   By: Charlett Nose M.D.   On: 02/12/2014 11:40   Mr Brain Wo Contrast  02/12/2014   CLINICAL DATA:  80 year old hypertensive female presenting with left leg weakness. Initial encounter.  EXAM: MRI HEAD WITHOUT CONTRAST  MRA HEAD WITHOUT CONTRAST  TECHNIQUE: Multiplanar, multiecho pulse sequences of the brain and surrounding structures were obtained without intravenous contrast. Angiographic images of the head were obtained using MRA technique without contrast.  COMPARISON:  02/04/2013 CT.  08/13/2007 MR.  FINDINGS: MRI HEAD FINDINGS  No acute infarct.  No intracranial hemorrhage.  Prominent small vessel disease type changes.  Global atrophy. Ventricular prominence felt to be related to atrophy rather than hydrocephalus.  No intracranial mass lesion noted on this unenhanced exam.  Cervical spondylotic changes with spinal stenosis and mild cord flattening C4-5 and C5-6. Transverse ligament hypertrophy.  Cervical medullary junction, pituitary region, pineal region and orbital structures unremarkable.  MRA HEAD FINDINGS  Mild atherosclerotic type changes of the cavernous segment of the internal carotid artery bilaterally with areas of mild narrowing and ectasia.  Slightly prominent infundibulum at the level of the posterior communicating artery origin bilaterally without discrete saccular aneurysm.  Mild to moderate focal narrowing of the A1 segment of the right anterior cerebral artery.  Mild middle  cerebral artery branch vessel irregularity bilaterally.  Mild narrowing distal right vertebral artery.  Non visualization posterior inferior cerebellar arteries bilaterally and left anterior inferior cerebellar artery.  Mild ectasia basilar artery without high-grade stenosis.  Mild to moderate narrowing portions of the right posterior cerebral artery.  IMPRESSION: MRI HEAD:  No acute infarct.  Prominent small vessel disease type changes.  Global atrophy.  Cervical spondylotic changes with spinal stenosis and mild cord flattening C4-5 and C5-6.  MRA HEAD:  Intracranial atherosclerotic type changes most notable involving branch vessels as noted above.   Electronically Signed   By: Bridgett Larsson M.D.   On: 02/12/2014 20:28   Nm Pulmonary Perf And Vent  02/05/2014   CLINICAL DATA:  Shortness of breath for 1 week. Bilateral airspace opacities and pleural effusions on recent radiographs.  EXAM: NUCLEAR MEDICINE VENTILATION - PERFUSION LUNG SCAN  TECHNIQUE: Ventilation images were obtained in multiple projections using inhaled aerosol technetium 99 M DTPA. Perfusion images were obtained in multiple projections after intravenous injection of Tc-60m MAA.  RADIOPHARMACEUTICALS:  40.1 mCi Tc-4149m DTPA aerosol and 5.5 mCi Tc-9249m MAA  COMPARISON:  Radiographs 06/27/2008 and 02/05/2014.  FINDINGS: Ventilation: There is decreased ventilation in both lung bases. There is central clumping of the aerosol within the tracheobronchial tree.  Perfusion: Decreased perfusion to both lung bases is matched to the ventilatory abnormalities and corresponds with bilateral pleural effusions. There are no segmental perfusion mismatches.  IMPRESSION: Low probability for acute pulmonary embolism. There are ventilation and perfusion defects in both lung bases corresponding with bilateral pleural effusions.   Electronically Signed   By: Roxy HorsemanBill  Veazey M.D.   On: 02/05/2014 15:30   Mr Maxine GlennMra Head/brain Wo Cm  02/12/2014   CLINICAL DATA:  80 year old  hypertensive female presenting with left leg weakness. Initial encounter.  EXAM: MRI HEAD WITHOUT CONTRAST  MRA HEAD WITHOUT CONTRAST  TECHNIQUE: Multiplanar, multiecho pulse sequences of the brain and surrounding structures were obtained without intravenous contrast. Angiographic images of the head were obtained using MRA technique without contrast.  COMPARISON:  02/04/2013 CT.  08/13/2007 MR.  FINDINGS: MRI HEAD FINDINGS  No acute infarct.  No intracranial hemorrhage.  Prominent small vessel disease type changes.  Global atrophy. Ventricular prominence felt to be related to atrophy rather than hydrocephalus.  No intracranial mass lesion noted on this unenhanced exam.  Cervical spondylotic changes with spinal stenosis and mild cord flattening C4-5 and C5-6. Transverse ligament hypertrophy.  Cervical medullary junction, pituitary region, pineal region and orbital structures unremarkable.  MRA HEAD FINDINGS  Mild atherosclerotic type changes of the cavernous segment of the internal carotid artery bilaterally with areas of mild narrowing and ectasia.  Slightly prominent infundibulum at the level of the posterior communicating artery origin bilaterally without discrete saccular aneurysm.  Mild to moderate focal narrowing of the A1 segment of the right anterior cerebral artery.  Mild middle cerebral artery branch vessel irregularity bilaterally.  Mild narrowing distal right vertebral artery.  Non visualization posterior inferior cerebellar arteries bilaterally and left anterior inferior cerebellar artery.  Mild ectasia basilar artery without high-grade stenosis.  Mild to moderate narrowing portions of the right posterior cerebral artery.  IMPRESSION: MRI HEAD:  No acute infarct.  Prominent small vessel disease type changes.  Global atrophy.  Cervical spondylotic changes with spinal stenosis and mild cord flattening C4-5 and C5-6.  MRA HEAD:  Intracranial atherosclerotic type changes most notable involving branch vessels  as noted above.   Electronically Signed   By: Bridgett LarssonSteve  Olson M.D.   On: 02/12/2014 20:28   ASSESSMENT AND PLAN: Principal Problem:  Left leg weakness - per IM/Neuro  Active Problems:  HYPERTENSION - per IM, SBP 130-140's now  Atrial fibrillation - rate in 90's - dig stopped.  Continue Diltiazem CD 120 mg and adjust PRN for rate control.  CHF (congestive heart failure), NYHA class II - CXR results above, BNP same as previous admit.  No IV diuresis needed at this time. Would put back on home dose of Lasix. CKD (chronic kidney disease), stage III - BUN/Cr at baseline, follow, per IM  Chronic respiratory failure with hypoxia - not on O2 at home, per IM  Weakness - per IM +/- Neuro  Elevated troponin - POC elevated x 1 but serum troponins are all normal. No further ischemic evaluation needed at this time.  Will sign off - call with any questions.  Quintella ReichertURNER,Anayely Constantine R, MD  02/13/2014  9:07 AM

## 2014-02-13 NOTE — Progress Notes (Addendum)
Subjective: Patient reports that she is unchanged.  Patient unable to ambulate with therapy today.  Transferred and did bed mobility at mod assist.    Objective: Current vital signs: BP 141/84  Pulse 91  Temp(Src) 98.2 F (36.8 C) (Oral)  Resp 16  Ht 5\' 2"  (1.575 m)  Wt 47.174 kg (104 lb)  BMI 19.02 kg/m2  SpO2 96% Vital signs in last 24 hours: Temp:  [98.1 F (36.7 C)-100 F (37.8 C)] 98.2 F (36.8 C) (09/29 0755) Pulse Rate:  [73-131] 91 (09/29 0755) Resp:  [16-30] 16 (09/29 0755) BP: (100-146)/(56-85) 141/84 mmHg (09/29 0755) SpO2:  [91 %-100 %] 96 % (09/29 0755) Weight:  [47.174 kg (104 lb)] 47.174 kg (104 lb) (09/28 1453)  Intake/Output from previous day: 09/28 0701 - 09/29 0700 In: 558.8 [I.V.:558.8] Out: 200 [Urine:200] Intake/Output this shift:   Nutritional status: Cardiac  Neurologic Exam: Mental Status:  Alert, oriented, thought content appropriate. Speech fluent without evidence of aphasia. Able to follow 3 step commands without difficulty.  Cranial Nerves:  II: Discs flat bilaterally; Visual fields grossly normal, pupils equal, round, reactive to light and accommodation  III,IV, VI: ptosis not present, extra-ocular motions intact bilaterally  V,VII: smile symmetric, facial light touch sensation normal bilaterally  VIII: hearing normal bilaterally  IX,X: gag reflex present  XI: bilateral shoulder shrug  XII: midline tongue extension without atrophy or fasciculations  Motor:  Right : Upper extremity 5/5        Left: Upper extremity 5/5   Lower extremity 4/5     Lower extremity 4/5 give way weakness  Tone and bulk:normal tone throughout; no atrophy noted  Sensory: Pinprick and light touch intact throughout, bilaterally  Deep Tendon Reflexes:  2+ throughout with absent AJ's bilaterally  Plantars:  Right: downgoing    Left: downgoing   Lab Results: Basic Metabolic Panel:  Recent Labs Lab 02/07/14 0350 02/08/14 0423 02/12/14 1147 02/13/14 0228  NA  141 140 138 134*  K 4.4 4.2 4.7 4.7  CL 100 98 96 96  CO2 31 34* 33* 25  GLUCOSE 105* 98 93 106*  BUN 45* 41* 31* 30*  CREATININE 1.68* 1.54* 1.22* 1.14*  CALCIUM 9.1 8.8 9.4 8.6    Liver Function Tests:  Recent Labs Lab 02/12/14 1147  AST 24  ALT 16  ALKPHOS 64  BILITOT 1.0  PROT 8.3  ALBUMIN 3.0*   No results found for this basename: LIPASE, AMYLASE,  in the last 168 hours No results found for this basename: AMMONIA,  in the last 168 hours  CBC:  Recent Labs Lab 02/12/14 1147 02/13/14 0228  WBC 10.9* 12.0*  HGB 15.1* 13.7  HCT 43.7 40.3  MCV 94.8 93.5  PLT 178 190    Cardiac Enzymes:  Recent Labs Lab 02/12/14 1530 02/12/14 2024 02/13/14 0222  TROPONINI <0.30 <0.30 <0.30    Lipid Panel:  Recent Labs Lab 02/13/14 0228  CHOL 118  TRIG 55  HDL 44  CHOLHDL 2.7  VLDL 11  LDLCALC 63    CBG:  Recent Labs Lab 02/12/14 1115  GLUCAP 95    Microbiology: Results for orders placed during the hospital encounter of 08/07/07  BODY FLUID CULTURE     Status: None   Collection Time    08/13/07 10:35 AM      Result Value Ref Range Status   Specimen Description KNEE LEFT   Final   Special Requests IMMUNE:NORM   Final   Gram Stain  Final   Value: ABUNDANT WBC PRESENT, PREDOMINANTLY PMN     NO SQUAMOUS EPITHELIAL CELLS SEEN     NO ORGANISMS SEEN   Culture NO GROWTH 3 DAYS   Final   Report Status 08/16/2007 FINAL   Final  CULTURE, BODY FLUID-BOTTLE     Status: None   Collection Time    08/13/07 10:35 AM      Result Value Ref Range Status   Specimen Description KNEE LEFT   Final   Special Requests BOTTLES DRAWN AEROBIC AND ANAEROBIC 3CC   Final   Culture NO GROWTH 5 DAYS   Final   Report Status 08/18/2007 FINAL   Final    Coagulation Studies:  Recent Labs  02/12/14 1147 02/13/14 0228  LABPROT 24.8* 26.0*  INR 2.24* 2.38*    Imaging: Dg Chest 2 View  02/12/2014   CLINICAL DATA:  Shortness of breath, weakness. History of hypertension.   EXAM: CHEST  2 VIEW  COMPARISON:  02/05/2014  FINDINGS: The heart is enlarged. There is perihilar bronchitic change. There has been improvement in aeration at the right lung base. Persistent minimal densities are identified at both lung bases. There are small bilateral pleural effusions.  IMPRESSION: 1. Bronchitic changes. 2. Improved aeration at the right lung base. 3. Persistent minimal atelectasis or resolving infiltrates and small effusions.   Electronically Signed   By: Rosalie Gums M.D.   On: 02/12/2014 11:14   Ct Head Wo Contrast  02/12/2014   CLINICAL DATA:  Weakness  EXAM: CT HEAD WITHOUT CONTRAST  TECHNIQUE: Contiguous axial images were obtained from the base of the skull through the vertex without intravenous contrast.  COMPARISON:  08/12/2007  FINDINGS: There is atrophy and chronic small vessel disease changes. No acute intracranial abnormality. Specifically, no hemorrhage, hydrocephalus, mass lesion, acute infarction, or significant intracranial injury. No acute calvarial abnormality. Visualized paranasal sinuses and mastoids clear. Orbital soft tissues unremarkable.  IMPRESSION: No acute intracranial abnormality.  Atrophy, chronic microvascular disease.   Electronically Signed   By: Charlett Nose M.D.   On: 02/12/2014 11:40   Mr Brain Wo Contrast  02/12/2014   CLINICAL DATA:  80 year old hypertensive female presenting with left leg weakness. Initial encounter.  EXAM: MRI HEAD WITHOUT CONTRAST  MRA HEAD WITHOUT CONTRAST  TECHNIQUE: Multiplanar, multiecho pulse sequences of the brain and surrounding structures were obtained without intravenous contrast. Angiographic images of the head were obtained using MRA technique without contrast.  COMPARISON:  02/04/2013 CT.  08/13/2007 MR.  FINDINGS: MRI HEAD FINDINGS  No acute infarct.  No intracranial hemorrhage.  Prominent small vessel disease type changes.  Global atrophy. Ventricular prominence felt to be related to atrophy rather than hydrocephalus.  No  intracranial mass lesion noted on this unenhanced exam.  Cervical spondylotic changes with spinal stenosis and mild cord flattening C4-5 and C5-6. Transverse ligament hypertrophy.  Cervical medullary junction, pituitary region, pineal region and orbital structures unremarkable.  MRA HEAD FINDINGS  Mild atherosclerotic type changes of the cavernous segment of the internal carotid artery bilaterally with areas of mild narrowing and ectasia.  Slightly prominent infundibulum at the level of the posterior communicating artery origin bilaterally without discrete saccular aneurysm.  Mild to moderate focal narrowing of the A1 segment of the right anterior cerebral artery.  Mild middle cerebral artery branch vessel irregularity bilaterally.  Mild narrowing distal right vertebral artery.  Non visualization posterior inferior cerebellar arteries bilaterally and left anterior inferior cerebellar artery.  Mild ectasia basilar artery without high-grade stenosis.  Mild to moderate narrowing portions of the right posterior cerebral artery.  IMPRESSION: MRI HEAD:  No acute infarct.  Prominent small vessel disease type changes.  Global atrophy.  Cervical spondylotic changes with spinal stenosis and mild cord flattening C4-5 and C5-6.  MRA HEAD:  Intracranial atherosclerotic type changes most notable involving branch vessels as noted above.   Electronically Signed   By: Bridgett LarssonSteve  Olson M.D.   On: 02/12/2014 20:28   Mr Maxine GlennMra Head/brain Wo Cm  02/12/2014   CLINICAL DATA:  80 year old hypertensive female presenting with left leg weakness. Initial encounter.  EXAM: MRI HEAD WITHOUT CONTRAST  MRA HEAD WITHOUT CONTRAST  TECHNIQUE: Multiplanar, multiecho pulse sequences of the brain and surrounding structures were obtained without intravenous contrast. Angiographic images of the head were obtained using MRA technique without contrast.  COMPARISON:  02/04/2013 CT.  08/13/2007 MR.  FINDINGS: MRI HEAD FINDINGS  No acute infarct.  No intracranial  hemorrhage.  Prominent small vessel disease type changes.  Global atrophy. Ventricular prominence felt to be related to atrophy rather than hydrocephalus.  No intracranial mass lesion noted on this unenhanced exam.  Cervical spondylotic changes with spinal stenosis and mild cord flattening C4-5 and C5-6. Transverse ligament hypertrophy.  Cervical medullary junction, pituitary region, pineal region and orbital structures unremarkable.  MRA HEAD FINDINGS  Mild atherosclerotic type changes of the cavernous segment of the internal carotid artery bilaterally with areas of mild narrowing and ectasia.  Slightly prominent infundibulum at the level of the posterior communicating artery origin bilaterally without discrete saccular aneurysm.  Mild to moderate focal narrowing of the A1 segment of the right anterior cerebral artery.  Mild middle cerebral artery branch vessel irregularity bilaterally.  Mild narrowing distal right vertebral artery.  Non visualization posterior inferior cerebellar arteries bilaterally and left anterior inferior cerebellar artery.  Mild ectasia basilar artery without high-grade stenosis.  Mild to moderate narrowing portions of the right posterior cerebral artery.  IMPRESSION: MRI HEAD:  No acute infarct.  Prominent small vessel disease type changes.  Global atrophy.  Cervical spondylotic changes with spinal stenosis and mild cord flattening C4-5 and C5-6.  MRA HEAD:  Intracranial atherosclerotic type changes most notable involving branch vessels as noted above.   Electronically Signed   By: Bridgett LarssonSteve  Olson M.D.   On: 02/12/2014 20:28    Medications:  I have reviewed the patient's current medications. Scheduled: . diltiazem  120 mg Oral Daily  . Influenza vac split quadrivalent PF  0.5 mL Intramuscular Tomorrow-1000  . loratadine  10 mg Oral Daily  . multivitamin with minerals  2 tablet Oral Daily  . pantoprazole  40 mg Oral Q0600  . sodium chloride  2 spray Each Nare QHS  . warfarin  2.5 mg  Oral Once per day on Sun Tue Wed Sat  . Warfarin - Pharmacist Dosing Inpatient   Does not apply q1800    Assessment/Plan: Patient unchanged.  Really no focal weakness noted on neurological examination.  MRI of the brain reviewed and shows no acute changes.  Discussion was had with patient about the possibility that this could be related to her low back.  Treatment options would include surgery and/or therapy.  Patient is not interested in surgery.  With that in mind would not do further imaging at this time but continue with therapy.    Recommendations: 1.  Continued PT 2.  Stroke work up not indicated at this time. 3.  Myasthenia panel     LOS: 1 day   Verlon AuLeslie  Thad Ranger, MD [facility] [phone] 02/13/2014  12:40 PM

## 2014-02-14 LAB — PROTIME-INR
INR: 2.29 — ABNORMAL HIGH (ref 0.00–1.49)
PROTHROMBIN TIME: 25.2 s — AB (ref 11.6–15.2)

## 2014-02-14 MED ORDER — PREDNISONE 20 MG PO TABS: 20.0000 mg | ORAL_TABLET | Freq: Every day | ORAL | Status: DC

## 2014-02-14 MED ORDER — PANTOPRAZOLE SODIUM 40 MG PO TBEC: 40.0000 mg | DELAYED_RELEASE_TABLET | Freq: Every day | ORAL | Status: DC

## 2014-02-14 MED ORDER — POLYETHYLENE GLYCOL 3350 17 G PO PACK: 17.0000 g | PACK | Freq: Every day | ORAL | Status: DC

## 2014-02-14 MED ORDER — FUROSEMIDE 20 MG PO TABS: 20.0000 mg | ORAL_TABLET | Freq: Every day | ORAL | Status: DC

## 2014-02-14 MED ORDER — BOOST / RESOURCE BREEZE PO LIQD
1.0000 | Freq: Two times a day (BID) | ORAL | Status: DC
Start: 2014-02-14 — End: 2014-02-14
  Administered 2014-02-14 (×2): 1 via ORAL

## 2014-02-14 MED ORDER — BOOST / RESOURCE BREEZE PO LIQD: 1.0000 | Freq: Two times a day (BID) | ORAL | Status: DC

## 2014-02-14 NOTE — Progress Notes (Signed)
Late entry for missed G-code. Based on review of the evaluation and goals by Prudencio PairMaija Prater, PT.  02/13/14 0943  PT G-Codes **NOT FOR INPATIENT CLASS**  Functional Assessment Tool Used Clinical judgement based on chart review  Functional Limitation Mobility: Walking and moving around  Mobility: Walking and Moving Around Current Status (Z6109(G8978) CL  Mobility: Walking and Moving Around Goal Status [phone](G8979) CI  Lavona MoundMark Morning Halberg, PT  [phone] 02/14/2014

## 2014-02-14 NOTE — Progress Notes (Signed)
Nutrition Brief Note  Per discussion with RN, patient's daughter reports that patient does not tolerate Ensure, it causes her to have diarrhea. RD to change supplement to Resource Breeze PO BID, each supplement provides 250 kcal and 9 grams of protein.  Joaquin CourtsKimberly Harris, RD, LDN, CNSC Pager [phone] After Hours Pager [phone]

## 2014-02-14 NOTE — Progress Notes (Signed)
ANTICOAGULATION CONSULT NOTE - Follow Up Consult  Pharmacy Consult for Coumadin Indication: atrial fibrillation  Allergies  Allergen Reactions  . Cephalexin Other (See Comments)    "Causes an infection"  . Other     Pain medicine , unsure of which one, used for gout, made her feel loopy  . Penicillins Swelling    Patient Measurements: Height: 5\' 2"  (157.5 cm) Weight: 109 lb 14.4 oz (49.85 kg) IBW/kg (Calculated) : 50.1 Heparin Dosing Weight:   Vital Signs: Temp: 97.8 F (36.6 C) (09/30 0911) Temp src: Oral (09/30 0911) BP: 123/50 mmHg (09/30 0911) Pulse Rate: 82 (09/30 0911)  Labs:  Recent Labs  02/12/14 1147 02/12/14 1530 02/12/14 2024 02/13/14 0222 02/13/14 0228 02/14/14 0514  HGB 15.1*  --   --   --  13.7  --   HCT 43.7  --   --   --  40.3  --   PLT 178  --   --   --  190  --   LABPROT 24.8*  --   --   --  26.0* 25.2*  INR 2.24*  --   --   --  2.38* 2.29*  CREATININE 1.22*  --   --   --  1.14*  --   TROPONINI  --  <0.30 <0.30 <0.30  --   --     Estimated Creatinine Clearance: 25.3 ml/min (by C-G formula based on Cr of 1.14).    Assessment: Weakness  80 y/o F with CAF, CHF, and HTN presents with weakness. Patient recently discharged for [facility] on 9/24 for CHF exac.   Anticoagulation: CAF on Coumadin PTA. INR 2.29. Hgb 15.1>>13.7. Plts stable.  Cardiovascular: CHF, CAF, HTN. Troponin 0.13. proBNP 2992. VSS.  Meds: diltiazem XR, po Lasix  Endocrinology: Glucose 106. Gout flare in R foot. Start prednisone x 5d  Gastrointestinal / Nutrition: LFT's WNL. Po PPI, po MV  Neurology: A&O. L sided weakness but CT negative.  Nephrology: CKD. Scr 1.14  Pulmonary: bronchiectasis. Claritin  Hematology / Oncology: Hgb WNL but down to 13.7  PTA Medication Issues: Vit C, Dig, Breo Ellipta   Goal of Therapy:  INR 2-3 Monitor platelets by anticoagulation protocol: Yes   Plan:  Resume Coumadin 2.5mg  T,W,SS Daily INR    Makalya Nave S. Merilynn Finlandobertson, PharmD,  BCPS Clinical Staff Pharmacist Pager [phone]  Misty Stanleyobertson, Austina Constantin Stillinger 02/14/2014,11:08 AM

## 2014-02-14 NOTE — Progress Notes (Signed)
Clinical Social Work Department BRIEF PSYCHOSOCIAL ASSESSMENT 02/14/2014  Patient:  Candice Rangel,Candice Rangel     Account Number:  [id number]     Admit date:  02/12/2014  Clinical Social Worker:  Harless NakayamaAMBELAL,Gaye Scorza, LCSWA  Date/Time:  02/14/2014 10:00 AM  Referred by:  Physician  Date Referred:  02/14/2014 Referred for  SNF Placement   Other Referral:   Interview type:  Family Other interview type:   Spoke with pt daughter over the phone    PSYCHOSOCIAL DATA Living Status:  ALONE Admitted from facility:   Level of care:   Primary support name:  Candice Rangel Primary support relationship to patient:  CHILD, ADULT Degree of support available:   Pt has strong family support    CURRENT CONCERNS Current Concerns  Post-Acute Placement   Other Concerns:    SOCIAL WORK ASSESSMENT / PLAN CSW spoke with pt and and pt family yesterday when they were undecided on SNF vs home. CSW spoke with pt daughter over the phone this morning and she informed CSW she is considering rehab for pt because she will not be able to manage if pt cannot even stand on her own. However, pt daughter did inform CSW that she will not send pt to just any facility and will consider her options carefully. If needed, she will find a way to take pt home if facilities are not to her liking.   Assessment/plan status:  Psychosocial Support/Ongoing Assessment of Needs Other assessment/ plan:   Information/referral to community resources:   SNF list to be provided with bed offers    PATIENT'S/FAMILY'S RESPONSE TO PLAN OF CARE: Pt daughter considering SNF option.       Braelen Sproule, LCSWA [phone]

## 2014-02-14 NOTE — Progress Notes (Addendum)
CSW (Clinical Child psychotherapistocial Worker) provided pt daughter with bed offers. She informed CSW [facility]. Pt daughter was made aware that insurance authorization is needed, and if pt is ready for dc before this authorization is available we may need to visit other options.   ADDENDUM 1:45pm: CSW received call from pt daughter notifying that she would like [facility] instead of previous facility choice. CSW notified both facilities.  Aleecia Tapia, LCSWA [phone]

## 2014-02-14 NOTE — Progress Notes (Signed)
[facility]  Patient Status: Active (receiving services up to time of hospitalization)  [facility] is providing the following services: RN, PT and OT  If patient discharges after hours, please call [phone].   Kizzie FurnishDonna Fellmy 02/14/2014, 4:14 PM

## 2014-02-14 NOTE — Progress Notes (Signed)
Physical Therapy Treatment Patient Details Name: Candice Rangel MRN: [id number] DOB: [birth year] Today's Date: 02/14/2014    History of Present Illness With history of chronic atrial fibrillation on Coumadin therapy, diastolic CHF, hypertension, recently hospitalized 1 weeks ago with d/c 9/24 acute on chronic diastolic heart failure who presents to the ED with sudden onset of left leg weakness    PT Comments    Pt reports RLE pain to believed to be from gout flare up. Pt able to ambulate 3 feet with RW today, but sat impulsively in recliner.  Continue per POC.  Follow Up Recommendations  SNF;Supervision/Assistance - 24 hour     Equipment Recommendations  3in1 (PT)    Recommendations for Other Services       Precautions / Restrictions Precautions Precautions: Fall Restrictions Weight Bearing Restrictions: No    Mobility  Bed Mobility Overal bed mobility: Needs Assistance Bed Mobility: Supine to Sit Rolling: Min assist Sidelying to sit: Mod assist Supine to sit: Mod assist Sit to supine: Max assist   General bed mobility comments: increased time to complete with max verbal/tactile cues  Transfers Overall transfer level: Needs assistance Equipment used: Rolling walker (2 wheeled) Transfers: Stand Pivot Transfers Sit to Stand: Mod assist Stand pivot transfers: Mod assist          Ambulation/Gait Ambulation/Gait assistance: Mod assist Ambulation Distance (Feet): 3 Feet Assistive device: Rolling walker (2 wheeled) Gait Pattern/deviations: Narrow base of support;Decreased stride length;Step-through pattern Gait velocity: very slow   General Gait Details: pt sat impulsively in recliner   Stairs            Wheelchair Mobility    Modified Rankin (Stroke Patients Only)       Balance     Sitting balance-Leahy Scale: Fair     Standing balance support: Bilateral upper extremity supported Standing balance-Leahy Scale: Poor Standing balance comment:  static stand with RW 30 sec x 2 with Mod assist                    Cognition Arousal/Alertness: Awake/alert Behavior During Therapy: WFL for tasks assessed/performed Overall Cognitive Status: History of cognitive impairments - at baseline Area of Impairment: Safety/judgement         Safety/Judgement: Decreased awareness of safety;Decreased awareness of deficits     General Comments: pt surprised when OT stated she would need a w/c and daughter would need to learn transfers for home d/c    Exercises      General Comments        Pertinent Vitals/Pain Pain Assessment: 0-10 Pain Score: 10-Worst pain ever Faces Pain Scale: Hurts little more Pain Location: R foot and knee with mobility Pain Descriptors / Indicators: Aching Pain Intervention(s): Monitored during session;Limited activity within patient's tolerance    Home Living Family/patient expects to be discharged to:: Private residence Living Arrangements: Alone Available Help at Discharge: Family;Available PRN/intermittently Type of Home: House Home Access: Level entry   Home Layout: One level Home Equipment: Walker - 2 wheels;Shower seat - built in;Grab bars - tub/shower;Hand held shower head Additional Comments: may have access to 3 in 1    Prior Function Level of Independence: Independent with assistive device(s);Needs assistance  Gait / Transfers Assistance Needed: pt reports that since D/C from [facility] she has needed assist for mobiltiy from dgtr but dgtr isn't staying 24hrs and lives next door. pt states she has been using RW       PT Goals (current goals can now be found in  the care plan section) Acute Rehab PT Goals Patient Stated Goal: return home Progress towards PT goals: Progressing toward goals    Frequency  Min 3X/week    PT Plan Current plan remains appropriate    Co-evaluation             End of Session Equipment Utilized During Treatment: Gait belt Activity Tolerance: Patient  limited by pain Patient left: in chair;with call bell/phone within reach;with chair alarm set     Time: [id number] PT Time Calculation (min): 23 min  Charges:  $Therapeutic Activity: 23-37 mins                    G Codes:      Ilda FoilGarrow, Candice Rangel 02/14/2014, 12:44 PM

## 2014-02-14 NOTE — Discharge Summary (Signed)
Physician Discharge Summary  [place] VHQ:[id number] DOB: Apr 18, 1921 DOA: 02/12/2014  PCP: Pamelia Hoit, MD  Admit date: 02/12/2014 Discharge date: 02/14/2014  Time spent: 35 minutes  Recommendations for Outpatient Follow-up:  1. Need to finish prednisone taper, follow improvement of LE weakness.  2. Need follow up on INR level, adjust coumadin as needed.  3. Need cbc to follow up WBC.   Discharge Diagnoses:    Left leg weakness   HYPERTENSION   Atrial fibrillation   CHF (congestive heart failure), NYHA class II   CKD (chronic kidney disease), stage III   Chronic respiratory failure with hypoxia   Weakness   Elevated troponin   Discharge Condition: stable.   Diet recommendation: Heart Healthy  Filed Weights   02/12/14 1016 02/12/14 1453 02/14/14 0746  Weight: 47.174 kg (104 lb) 47.174 kg (104 lb) 49.85 kg (109 lb 14.4 oz)    History of present illness:  Candice Rangel is a 80 y.o. female  With history of chronic atrial fibrillation on Coumadin therapy, diastolic CHF, hypertension, recently hospitalized 1-2 weeks ago with acute on chronic diastolic heart failure who presents to the ED with sudden onset of left leg weakness. Per daughter patient was last seen in a normal state around 11:30 PM the night prior to admission. Daughter states that mother have been slowly getting week. At 6:30 AM on the day of admission patient states she awoke and could not lift her left leg. Patient called her daughter around 65 AM could not lift her leg EMS was called the patient was brought to the ED. Patient and daughter deny any slurred speech, no visual changes, no facial droop, no shortness of breath, no fever, no chills, no nausea, no vomiting, no abdominal pain, no diarrhea, no constipation, no dysuria.  Daughter states mother has progressively been getting weak. Daughter states that on discharge from last hospitalization she was placed on diuretics and patient's diltiazem dose was  decreased from 240 mg 120 mg daily. Patient's mother states that patient usually gets her medications from a pill box and 3 days prior to admission patient took Cardizem 240 mg into the 120 mg. Daughter states that evening mother started to feel weak and felt weak all day the next day and was not acting herself. One day prior to admission daughter held patient's Cardizem and diuretic as she fell disease was causing patient symptoms.  Patient was subsequently brought to the emergency room. In the ED CT of the head which was done was negative for any acute abnormalities. Comprehensive metabolic profile done and a bicarbonate of 33 BUN of 31 creatinine 1.2 to otherwise was within normal limits. Point-of-care troponin was elevated at 0.13. Pro BNP was elevated at 2992. CBC had a white count of 10.9 hemoglobin of 15.1 otherwise was unremarkable. INR was 2.24. Dig level is less than 0.3. Urinalysis done was unremarkable. Chest x-ray was negative for any acute abnormalities. EKG showed atrial fibrillation with a rate of 94 with some T-wave inversions in leads V4 through V6 which is unchanged from prior EKG.   Hospital Course:  #1 left-sided weakness  Questionable etiology. MRI of the head was negative for acute infarction. CT head negative. Patient with some improvement with the left-sided weakness. Patient has been seen by neurology and no further stroke workup needed at this time. X-ray of the lumbar spine with mild compression/wedge deformity of L1 vertebral body which is chronic. Mild rightward scoliosis. Osteopenia. No acute findings. PT/OT. Likely needs SNF. Prednisone taper.  #  2 elevated troponins  Point-of-care troponin was elevated. Troponins that were cycled were negative x3. Patient has been seen by cardiology. Digoxin has been discontinued. Patient has been continued on lower dose of diltiazem 120 mg daily. Per cardiology no further ischemic workup needed at this time.  #3 atrial fibrillation   Continue diltiazem CD 120 mg daily for rate control and adjust as needed. Digoxin has been discontinued per cardiology. Coumadin for anticoagulation.  #4 chronic kidney disease stage III  Stable at baseline.  #5 chronic respiratory failure with hypoxia on O2 at home per family  Stable.  #6 chronic diastolic CHF class II  BMP essentially unchanged. No need of IV diuretics at this time per cardiology. Resume home dose Lasix.  #7 prophylaxis  Coumadin for DVT prophylaxis. Foot pain; prednisone taper for gout ? Marland Kitchen   Procedures:  none  Consultations:  Neurology  Cardiology  Discharge Exam: Filed Vitals:   02/14/14 0911  BP: 123/50  Pulse: 82  Temp: 97.8 F (36.6 C)  Resp: 16    General: Alert in no distress.  Cardiovascular: S 1, S 2 RRR Respiratory: CTA  Discharge Instructions You were cared for by a hospitalist during your hospital stay. If you have any questions about your discharge medications or the care you received while you were in the hospital after you are discharged, you can call the unit and asked to speak with the hospitalist on call if the hospitalist that took care of you is not available. Once you are discharged, your primary care physician will handle any further medical issues. Please note that NO REFILLS for any discharge medications will be authorized once you are discharged, as it is imperative that you return to your primary care physician (or establish a relationship with a primary care physician if you do not have one) for your aftercare needs so that they can reassess your need for medications and monitor your lab values.  Discharge Instructions   Diet - low sodium heart healthy    Complete by:  As directed      Increase activity slowly    Complete by:  As directed           Current Discharge Medication List    START taking these medications   Details  feeding supplement, RESOURCE BREEZE, (RESOURCE BREEZE) LIQD Take 1 Container by mouth 2 (two)  times daily between meals. Qty: 30 Container, Refills: 0    pantoprazole (PROTONIX) 40 MG tablet Take 1 tablet (40 mg total) by mouth daily at 6 (six) AM. Qty: 30 tablet, Refills: 0    polyethylene glycol (MIRALAX) packet Take 17 g by mouth daily. Qty: 14 each, Refills: 0    predniSONE (DELTASONE) 20 MG tablet Take 1 tablet (20 mg total) by mouth daily before breakfast. Qty: 4 tablet, Refills: 0      CONTINUE these medications which have CHANGED   Details  furosemide (LASIX) 20 MG tablet Take 1 tablet (20 mg total) by mouth daily. Qty: 30 tablet, Refills: 0      CONTINUE these medications which have NOT CHANGED   Details  Bioflavonoid Products (VITAMIN C) CHEW Chew 2 tablets by mouth daily.    cetirizine (ZYRTEC) 10 MG tablet Take 10 mg by mouth daily.    diltiazem (DILACOR XR) 120 MG 24 hr capsule Take 1 capsule (120 mg total) by mouth daily. Qty: 30 capsule, Refills: 0    Fluticasone Furoate-Vilanterol (BREO ELLIPTA) 200-25 MCG/INH AEPB Inhale 1 Inhaler into the lungs  daily as needed (Shortness of Breath).     Multiple Vitamin (MULTIVITAMIN) tablet Take 2 tablets by mouth daily.     sodium chloride (OCEAN) 0.65 % SOLN nasal spray Place 2 sprays into both nostrils at bedtime.    warfarin (COUMADIN) 5 MG tablet Take 2.5 mg by mouth 4 (four) times a week.      STOP taking these medications     digoxin (LANOXIN) 0.125 MG tablet        Allergies  Allergen Reactions  . Cephalexin Other (See Comments)    "Causes an infection"  . Other     Pain medicine , unsure of which one, used for gout, made her feel loopy  . Penicillins Swelling   Follow-up Information   Follow up with Pamelia HoitWILSON,FRED HENRY, MD In 1 week.   Specialty:  Family Medicine   Contact information:   [address] [place] [id number] [phone]        The results of significant diagnostics from this hospitalization (including imaging, microbiology, ancillary and laboratory) are listed below  for reference.    Significant Diagnostic Studies: Dg Chest 2 View  02/12/2014   CLINICAL DATA:  Shortness of breath, weakness. History of hypertension.  EXAM: CHEST  2 VIEW  COMPARISON:  02/05/2014  FINDINGS: The heart is enlarged. There is perihilar bronchitic change. There has been improvement in aeration at the right lung base. Persistent minimal densities are identified at both lung bases. There are small bilateral pleural effusions.  IMPRESSION: 1. Bronchitic changes. 2. Improved aeration at the right lung base. 3. Persistent minimal atelectasis or resolving infiltrates and small effusions.   Electronically Signed   By: Rosalie GumsBeth  Brown M.D.   On: 02/12/2014 11:14   Dg Chest 2 View  02/05/2014   CLINICAL DATA:  Shortness of breath.  Hypertension.  EXAM: CHEST  2 VIEW  COMPARISON:  06/27/2008.  FINDINGS: The heart remains grossly normal in size. No significant change in linear density and small amount of pleural fluid at the left lung base. Increased patchy density in pleural fluid at the right lung base. Stable mildly prominent interstitial markings. Diffuse osteopenia. Atheromatous arterial calcifications.  IMPRESSION: 1. Bibasilar atelectasis and possible pneumonia with bilateral pleural effusions, greater on the right. 2. Stable chronic interstitial lung disease.   Electronically Signed   By: Gordan PaymentSteve  Reid M.D.   On: 02/05/2014 12:05   Dg Lumbar Spine Complete  02/13/2014   CLINICAL DATA:  Left leg weakness.  Low back pain.  No injury.  EXAM: LUMBAR SPINE - COMPLETE 4+ VIEW  COMPARISON:  None.  FINDINGS: Diffuse degenerative disc disease and facet disease throughout the lumbar spine. Diffuse osteopenia. Mild compression an wedge deformity of L1 appears chronic. No subluxation. SI joints are symmetric and unremarkable. Mild rightward scoliosis of the lumbar spine.  IMPRESSION: Mild compression/ wedge deformity of the L1 vertebral body which appears chronic.  Mild rightward scoliosis. Diffuse degenerative  disc and facet disease.  Osteopenia.  No acute findings.   Electronically Signed   By: Charlett NoseKevin  Dover M.D.   On: 02/13/2014 13:04   Ct Head Wo Contrast  02/12/2014   CLINICAL DATA:  Weakness  EXAM: CT HEAD WITHOUT CONTRAST  TECHNIQUE: Contiguous axial images were obtained from the base of the skull through the vertex without intravenous contrast.  COMPARISON:  08/12/2007  FINDINGS: There is atrophy and chronic small vessel disease changes. No acute intracranial abnormality. Specifically, no hemorrhage, hydrocephalus, mass lesion, acute infarction, or significant intracranial  injury. No acute calvarial abnormality. Visualized paranasal sinuses and mastoids clear. Orbital soft tissues unremarkable.  IMPRESSION: No acute intracranial abnormality.  Atrophy, chronic microvascular disease.   Electronically Signed   By: Charlett Nose M.D.   On: 02/12/2014 11:40   Mr Brain Wo Contrast  02/12/2014   CLINICAL DATA:  80 year old hypertensive female presenting with left leg weakness. Initial encounter.  EXAM: MRI HEAD WITHOUT CONTRAST  MRA HEAD WITHOUT CONTRAST  TECHNIQUE: Multiplanar, multiecho pulse sequences of the brain and surrounding structures were obtained without intravenous contrast. Angiographic images of the head were obtained using MRA technique without contrast.  COMPARISON:  02/04/2013 CT.  08/13/2007 MR.  FINDINGS: MRI HEAD FINDINGS  No acute infarct.  No intracranial hemorrhage.  Prominent small vessel disease type changes.  Global atrophy. Ventricular prominence felt to be related to atrophy rather than hydrocephalus.  No intracranial mass lesion noted on this unenhanced exam.  Cervical spondylotic changes with spinal stenosis and mild cord flattening C4-5 and C5-6. Transverse ligament hypertrophy.  Cervical medullary junction, pituitary region, pineal region and orbital structures unremarkable.  MRA HEAD FINDINGS  Mild atherosclerotic type changes of the cavernous segment of the internal carotid artery  bilaterally with areas of mild narrowing and ectasia.  Slightly prominent infundibulum at the level of the posterior communicating artery origin bilaterally without discrete saccular aneurysm.  Mild to moderate focal narrowing of the A1 segment of the right anterior cerebral artery.  Mild middle cerebral artery branch vessel irregularity bilaterally.  Mild narrowing distal right vertebral artery.  Non visualization posterior inferior cerebellar arteries bilaterally and left anterior inferior cerebellar artery.  Mild ectasia basilar artery without high-grade stenosis.  Mild to moderate narrowing portions of the right posterior cerebral artery.  IMPRESSION: MRI HEAD:  No acute infarct.  Prominent small vessel disease type changes.  Global atrophy.  Cervical spondylotic changes with spinal stenosis and mild cord flattening C4-5 and C5-6.  MRA HEAD:  Intracranial atherosclerotic type changes most notable involving branch vessels as noted above.   Electronically Signed   By: Bridgett Larsson M.D.   On: 02/12/2014 20:28   Nm Pulmonary Perf And Vent  02/05/2014   CLINICAL DATA:  Shortness of breath for 1 week. Bilateral airspace opacities and pleural effusions on recent radiographs.  EXAM: NUCLEAR MEDICINE VENTILATION - PERFUSION LUNG SCAN  TECHNIQUE: Ventilation images were obtained in multiple projections using inhaled aerosol technetium 99 M DTPA. Perfusion images were obtained in multiple projections after intravenous injection of Tc-49m MAA.  RADIOPHARMACEUTICALS:  40.1 mCi Tc-11m DTPA aerosol and 5.5 mCi Tc-82m MAA  COMPARISON:  Radiographs 06/27/2008 and 02/05/2014.  FINDINGS: Ventilation: There is decreased ventilation in both lung bases. There is central clumping of the aerosol within the tracheobronchial tree.  Perfusion: Decreased perfusion to both lung bases is matched to the ventilatory abnormalities and corresponds with bilateral pleural effusions. There are no segmental perfusion mismatches.  IMPRESSION: Low  probability for acute pulmonary embolism. There are ventilation and perfusion defects in both lung bases corresponding with bilateral pleural effusions.   Electronically Signed   By: Roxy Horseman M.D.   On: 02/05/2014 15:30   Mr Maxine Glenn Head/brain Wo Cm  02/12/2014   CLINICAL DATA:  80 year old hypertensive female presenting with left leg weakness. Initial encounter.  EXAM: MRI HEAD WITHOUT CONTRAST  MRA HEAD WITHOUT CONTRAST  TECHNIQUE: Multiplanar, multiecho pulse sequences of the brain and surrounding structures were obtained without intravenous contrast. Angiographic images of the head were obtained using MRA technique without contrast.  COMPARISON:  02/04/2013 CT.  08/13/2007 MR.  FINDINGS: MRI HEAD FINDINGS  No acute infarct.  No intracranial hemorrhage.  Prominent small vessel disease type changes.  Global atrophy. Ventricular prominence felt to be related to atrophy rather than hydrocephalus.  No intracranial mass lesion noted on this unenhanced exam.  Cervical spondylotic changes with spinal stenosis and mild cord flattening C4-5 and C5-6. Transverse ligament hypertrophy.  Cervical medullary junction, pituitary region, pineal region and orbital structures unremarkable.  MRA HEAD FINDINGS  Mild atherosclerotic type changes of the cavernous segment of the internal carotid artery bilaterally with areas of mild narrowing and ectasia.  Slightly prominent infundibulum at the level of the posterior communicating artery origin bilaterally without discrete saccular aneurysm.  Mild to moderate focal narrowing of the A1 segment of the right anterior cerebral artery.  Mild middle cerebral artery branch vessel irregularity bilaterally.  Mild narrowing distal right vertebral artery.  Non visualization posterior inferior cerebellar arteries bilaterally and left anterior inferior cerebellar artery.  Mild ectasia basilar artery without high-grade stenosis.  Mild to moderate narrowing portions of the right posterior cerebral  artery.  IMPRESSION: MRI HEAD:  No acute infarct.  Prominent small vessel disease type changes.  Global atrophy.  Cervical spondylotic changes with spinal stenosis and mild cord flattening C4-5 and C5-6.  MRA HEAD:  Intracranial atherosclerotic type changes most notable involving branch vessels as noted above.   Electronically Signed   By: Bridgett Larsson M.D.   On: 02/12/2014 20:28    Microbiology: No results found for this or any previous visit (from the past 240 hour(s)).   Labs: Basic Metabolic Panel:  Recent Labs Lab 02/08/14 0423 02/12/14 1147 02/13/14 0228  NA 140 138 134*  K 4.2 4.7 4.7  CL 98 96 96  CO2 34* 33* 25  GLUCOSE 98 93 106*  BUN 41* 31* 30*  CREATININE 1.54* 1.22* 1.14*  CALCIUM 8.8 9.4 8.6   Liver Function Tests:  Recent Labs Lab 02/12/14 1147  AST 24  ALT 16  ALKPHOS 64  BILITOT 1.0  PROT 8.3  ALBUMIN 3.0*   No results found for this basename: LIPASE, AMYLASE,  in the last 168 hours No results found for this basename: AMMONIA,  in the last 168 hours CBC:  Recent Labs Lab 02/12/14 1147 02/13/14 0228  WBC 10.9* 12.0*  HGB 15.1* 13.7  HCT 43.7 40.3  MCV 94.8 93.5  PLT 178 190   Cardiac Enzymes:  Recent Labs Lab 02/12/14 1530 02/12/14 2024 02/13/14 0222  TROPONINI <0.30 <0.30 <0.30   BNP: BNP (last 3 results)  Recent Labs  02/05/14 1350 02/07/14 0350 02/12/14 1147  PROBNP 3066.0* 2980.0* 2992.0*   CBG:  Recent Labs Lab 02/12/14 1115  GLUCAP 95       Signed:  Trenden Hazelrigg A  [facility] 02/14/2014, 10:20 AM

## 2014-02-14 NOTE — Evaluation (Signed)
Occupational Therapy Evaluation Patient Details Name: Candice Rangel MRN: [id number] DOB: 07/31/1920 Today's Date: 02/14/2014    History of Present Illness With history of chronic atrial fibrillation on Coumadin therapy, diastolic CHF, hypertension, recently hospitalized 1 weeks ago with d/c 9/24 acute on chronic diastolic heart failure who presents to the ED with sudden onset of left leg weakness   Clinical Impression   Pt was living alone with intermittent assist of her family prior to admission. She could perform self care and light meal prep independently.  Pt now presents with weakness and R foot pain.  She requires assistance for bed mobility and transfers with mod assist to the commode, but remain non ambulatory.  Pt has poor insight into her limitations.  She does not have 24 hour care.  Pt wants to return home and is declining SNF for ST rehab.    Follow Up Recommendations  SNF;Supervision/Assistance - 24 hour    Equipment Recommendations  3 in 1 bedside comode if family does not find one, manual w/c with w/c cushion   Recommendations for Other Services       Precautions / Restrictions Precautions Precautions: Fall Restrictions Weight Bearing Restrictions: No      Mobility Bed Mobility Overal bed mobility: Needs Assistance Bed Mobility: Rolling;Sidelying to Sit;Sit to Supine Rolling: Min assist Sidelying to sit: Mod assist   Sit to supine: Max assist   General bed mobility comments: increased time, max cues for sequence and assist to bend knee and rotate pelvis  Transfers Overall transfer level: Needs assistance   Transfers: Stand Pivot Transfers   Stand pivot transfers: Mod assist            Balance     Sitting balance-Leahy Scale: Fair       Standing balance-Leahy Scale: Poor                              ADL Overall ADL's : Needs assistance/impaired Eating/Feeding: Independent;Bed level   Grooming: Wash/dry hands;Wash/dry face;Set  up;Sitting   Upper Body Bathing: Set up;Sitting   Lower Body Bathing: Maximal assistance;Sit to/from stand   Upper Body Dressing : Set up;Sitting   Lower Body Dressing: Maximal assistance;Sit to/from stand   Toilet Transfer: Moderate assistance;Stand-pivot;BSC   Toileting- Clothing Manipulation and Hygiene: Supervision/safety;Sitting/lateral lean       Functional mobility during ADLs:  (pt not able to ambulate)       Vision                     Perception     Praxis      Pertinent Vitals/Pain Pain Assessment: Faces Pain Score: 4  Faces Pain Scale: Hurts little more Pain Location: R foot Pain Descriptors / Indicators: Aching Pain Intervention(s): Repositioned     Hand Dominance Right   Extremity/Trunk Assessment Upper Extremity Assessment Upper Extremity Assessment: Overall WFL for tasks assessed   Lower Extremity Assessment Lower Extremity Assessment: Defer to PT evaluation (pt with pain in R LE, limited dorsiflexion)   Cervical / Trunk Assessment Cervical / Trunk Assessment: Kyphotic   Communication Communication Communication: HOH   Cognition Arousal/Alertness: Awake/alert Behavior During Therapy: WFL for tasks assessed/performed Overall Cognitive Status: History of cognitive impairments - at baseline Area of Impairment: Safety/judgement         Safety/Judgement: Decreased awareness of safety;Decreased awareness of deficits     General Comments: pt surprised when OT stated she would need a w/c  and daughter would need to learn transfers for home d/c   General Comments       Exercises       Shoulder Instructions      Home Living Family/patient expects to be discharged to:: Private residence Living Arrangements: Alone Available Help at Discharge: Family;Available PRN/intermittently Type of Home: House Home Access: Level entry     Home Layout: One level     Bathroom Shower/Tub: Producer, television/film/video: Standard      Home Equipment: Environmental consultant - 2 wheels;Shower seat - built in;Grab bars - tub/shower;Hand held shower head   Additional Comments: may have access to 3 in 1      Prior Functioning/Environment Level of Independence: Independent with assistive device(s);Needs assistance  Gait / Transfers Assistance Needed: pt reports that since D/C from [facility] she has needed assist for mobiltiy from dgtr but dgtr isn't staying 24hrs and lives next door. pt states she has been using RW          OT Diagnosis: Generalized weakness;Acute pain;Cognitive deficits   OT Problem List: Decreased strength;Decreased activity tolerance;Impaired balance (sitting and/or standing);Decreased cognition;Decreased safety awareness;Pain;Decreased knowledge of use of DME or AE   OT Treatment/Interventions: Self-care/ADL training;DME and/or AE instruction;Therapeutic activities;Patient/family education;Balance training    OT Goals(Current goals can be found in the care plan section) Acute Rehab OT Goals Patient Stated Goal: return home OT Goal Formulation: With patient Time For Goal Achievement: 02/28/14 Potential to Achieve Goals: Good ADL Goals Pt Will Perform Grooming: with min guard assist;standing Pt Will Perform Lower Body Bathing: sit to/from stand;with min guard assist Pt Will Perform Lower Body Dressing: with min guard assist;sit to/from stand Pt Will Transfer to Toilet: with min guard assist;ambulating;bedside commode Pt Will Perform Toileting - Clothing Manipulation and hygiene: with min guard assist;sit to/from stand Additional ADL Goal #1: Pt will perform bed mobility at a supervision level as a precursor to ADL.  OT Frequency: Min 2X/week   Barriers to D/C: Decreased caregiver support          Co-evaluation              End of Session Equipment Utilized During Treatment: Gait belt  Activity Tolerance: Patient limited by pain Patient left: in bed;with call bell/phone within reach;with nursing/sitter in  room;with bed alarm set   Time: 1610-9604 OT Time Calculation (min): 34 min Charges:  OT General Charges $OT Visit: 1 Procedure OT Evaluation $Initial OT Evaluation Tier I: 1 Procedure OT Treatments $Self Care/Home Management : 8-22 mins G-Codes: OT G-codes **NOT FOR INPATIENT CLASS** Functional Assessment Tool Used: clinical judgement Functional Limitation: Self care Self Care Current Status (V4098): At least 60 percent but less than 80 percent impaired, limited or restricted Self Care Goal Status (J1914): At least 1 percent but less than 20 percent impaired, limited or restricted  Evern Bio 02/14/2014, 9:40 AM [phone]

## 2014-02-14 NOTE — Progress Notes (Addendum)
Clinical Social Work Department CLINICAL SOCIAL WORK PLACEMENT NOTE 02/14/2014  Patient:  Candice Rangel,Candice Rangel  Account Number:  [id number] Admit date:  02/12/2014  Clinical Social Worker:  Sharol HarnessPOONUM Lanee Chain, Theresia MajorsLCSWA  Date/time:  02/14/2014 11:00 AM  Clinical Social Work is seeking post-discharge placement for this patient at the following level of care:   SKILLED NURSING   (*CSW will update this form in Epic as items are completed)   02/14/2014  Patient/family provided with [facility] Work's list of facilities offering this level of care within the geographic area requested by the patient (or if unable, by the patient's family).  02/14/2014  Patient/family informed of their freedom to choose among providers that offer the needed level of care, that participate in Medicare, Medicaid or managed care program needed by the patient, have an available bed and are willing to accept the patient.  02/14/2014  Patient/family informed of [facility]' ownership interest in [facility], as well as of the fact that they are under no obligation to receive care at this facility.  PASARR submitted to EDS on 02/14/2014 PASARR number received on 02/14/2014  FL2 transmitted to all facilities in geographic area requested by pt/family on  02/14/2014 FL2 transmitted to all facilities within larger geographic area on   Patient informed that his/her managed care company has contracts with or will negotiate with  certain facilities, including the following:     Patient/family informed of bed offers received:  02/14/2014 [facility] [place] Physician recommends and patient chooses bed at    Patient [facility] [place] on  02/14/2014 Patient to be transferred to facility by [facility] Patient and family notified of transfer on 02/14/2014 Name of family member notified:  Patsy LagerYolanda (daughter)  The following physician  request were entered in Epic:   Additional CommentsSharol Harness:  Jeniah Kishi, LCSWA [phone]

## 2014-02-15 ENCOUNTER — Encounter: Payer: Self-pay | Admitting: Internal Medicine

## 2014-02-15 ENCOUNTER — Non-Acute Institutional Stay (SKILLED_NURSING_FACILITY): Payer: Medicare HMO | Admitting: Internal Medicine

## 2014-02-15 DIAGNOSIS — R29898 Other symptoms and signs involving the musculoskeletal system: Secondary | ICD-10-CM

## 2014-02-15 DIAGNOSIS — I482 Chronic atrial fibrillation, unspecified: Secondary | ICD-10-CM

## 2014-02-15 DIAGNOSIS — K219 Gastro-esophageal reflux disease without esophagitis: Secondary | ICD-10-CM

## 2014-02-15 DIAGNOSIS — I5032 Chronic diastolic (congestive) heart failure: Secondary | ICD-10-CM

## 2014-02-15 DIAGNOSIS — J309 Allergic rhinitis, unspecified: Secondary | ICD-10-CM

## 2014-02-15 NOTE — Progress Notes (Signed)
Patient ID: Candice Rangel, female   DOB: 13-Apr-1921, 80 y.o.   MRN: [id number]     Facility: [facility] [place]   PCP: Pamelia Hoit, MD  Code Status: full code  Allergies  Allergen Reactions  . Cephalexin Other (See Comments)    "Causes an infection"  . Other     Pain medicine , unsure of which one, used for gout, made her feel loopy  . Penicillins Swelling    Chief Complaint: new admission  HPI:  80 y/o female patient is here for STR after hospital admission from 02/12/14-02/14/14 with left leg weakness. Ct head was negative for acute changes, infection workup was negative. Mri of head was negative for acute infarction. Neurology was consulted. She was put on prednisone and PT/OT were consulted. Cardiology was also consulted with her afib and her digoxin has now been discontinued.  She has history of afib, CHF, HTN, chronic respiratory failure. She is seen in her room today. She denies any complaints. She mentions that she feels her strength is coming back. She would like to go home as soon as possible. Her daughter is present in the room.  Review of Systems:  Constitutional: Negative for fever, chills, diaphoresis.  HENT: Negative for congestion Eyes: Negative for eye pain, blurred vision.  Respiratory: Negative for cough, sputum production, shortness of breath and wheezing.   Cardiovascular: Negative for chest pain, palpitations, leg swelling.  Gastrointestinal: Negative for heartburn, nausea, vomiting, abdominal pain  Genitourinary: Negative for dysuria Musculoskeletal: Negative for back pain, falls Skin: Negative for itching and rash.  Neurological: Negative for weakness,dizziness, tingling, focal weakness and headaches.  Psychiatric/Behavioral: Negative for depression     Past Medical History  Diagnosis Date  . Hypertension   . Atrial fibrillation   . Syncope   . Drug therapy     coumadin  . Bronchiectasis     History of  . Pneumonia "lots of  times"  . Arthritis     "back" (02/12/2014)  . On home oxygen therapy     "prn for the last week" (02/12/2014)   Past Surgical History  Procedure Laterality Date  . Knee arthrocentesis Left 07/2007    and injection  . Tonsillectomy    . Cesarean section  1958  . Femur fracture surgery Right     Following a trauma  . Cataract extraction, bilateral Bilateral   . Fracture surgery    . Hip fracture surgery Right     pt denies this hx on 02/12/2014 "It was my leg, not my hip"   Social History:   reports that she has never smoked. She has never used smokeless tobacco. She reports that she does not drink alcohol or use illicit drugs.  Family History  Problem Relation Age of Onset  . Heart attack Mother 71  . Pneumonia Father     Medications: Patient's Medications  New Prescriptions   No medications on file  Previous Medications   BIOFLAVONOID PRODUCTS (VITAMIN C) CHEW    Chew 2 tablets by mouth daily.   CETIRIZINE (ZYRTEC) 10 MG TABLET    Take 10 mg by mouth daily.   DILTIAZEM (DILACOR XR) 120 MG 24 HR CAPSULE    Take 1 capsule (120 mg total) by mouth daily.   FEEDING SUPPLEMENT, RESOURCE BREEZE, (RESOURCE BREEZE) LIQD    Take 1 Container by mouth 2 (two) times daily between meals.   FLUTICASONE FUROATE-VILANTEROL (BREO ELLIPTA) 200-25 MCG/INH AEPB    Inhale 1 Inhaler into the lungs  daily as needed (Shortness of Breath).    FUROSEMIDE (LASIX) 20 MG TABLET    Take 1 tablet (20 mg total) by mouth daily.   MULTIPLE VITAMIN (MULTIVITAMIN) TABLET    Take 2 tablets by mouth daily.    PANTOPRAZOLE (PROTONIX) 40 MG TABLET    Take 1 tablet (40 mg total) by mouth daily at 6 (six) AM.   POLYETHYLENE GLYCOL (MIRALAX) PACKET    Take 17 g by mouth daily.   PREDNISONE (DELTASONE) 20 MG TABLET    Take 1 tablet (20 mg total) by mouth daily before breakfast.   SODIUM CHLORIDE (OCEAN) 0.65 % SOLN NASAL SPRAY    Place 2 sprays into both nostrils at bedtime.   WARFARIN (COUMADIN) 5 MG TABLET    Take 2.5  mg by mouth 4 (four) times a week.  Modified Medications   No medications on file  Discontinued Medications   No medications on file     Physical Exam: Filed Vitals:   02/15/14 1337  BP: 120/68  Pulse: 88  Temp: 97.3 F (36.3 C)  Resp: 18  Height: 5\' 2"  (1.575 m)  Weight: 111 lb (50.349 kg)  SpO2: 94%    General- elderly female in no acute distress Head- atraumatic, normocephalic Eyes- PERRLA, EOMI, pallor present, no icterus, no discharge Neck- no lymphadenopathy Throat- moist mucus membrane Cardiovascular- normal s1,s2, no murmurs Respiratory- bilateral clear to auscultation, no wheeze, no rhonchi, no crackles, no use of accessory muscles Abdomen- bowel sounds present, soft, non tender Musculoskeletal- able to move all 4 extremities,no leg edema, some left leg weakness noted Neurological- no focal deficit Skin- warm and dry Psychiatry- alert and oriented to person, place and time, normal mood and affect   Labs reviewed: Basic Metabolic Panel:  Recent Labs  16/10/96 0423 02/12/14 1147 02/13/14 0228  NA 140 138 134*  K 4.2 4.7 4.7  CL 98 96 96  CO2 34* 33* 25  GLUCOSE 98 93 106*  BUN 41* 31* 30*  CREATININE 1.54* 1.22* 1.14*  CALCIUM 8.8 9.4 8.6   Liver Function Tests:  Recent Labs  02/05/14 1129 02/06/14 0615 02/12/14 1147  AST 34 25 24  ALT 31 25 16   ALKPHOS 62 61 64  BILITOT 0.8 0.8 1.0  PROT 8.7* 8.2 8.3  ALBUMIN 3.7 3.3* 3.0*   No results found for this basename: LIPASE, AMYLASE,  in the last 8760 hours No results found for this basename: AMMONIA,  in the last 8760 hours CBC:  Recent Labs  02/05/14 1129  02/06/14 0615 02/12/14 1147 02/13/14 0228  WBC 7.0  < > 7.9 10.9* 12.0*  NEUTROABS 5.4  --   --   --   --   HGB 14.0  < > 14.0 15.1* 13.7  HCT 43.1  < > 42.2 43.7 40.3  MCV 96.6  < > 97.0 94.8 93.5  PLT 179  < > 145* 178 190  < > = values in this interval not displayed. Cardiac Enzymes:  Recent Labs  02/12/14 1530  02/12/14 2024 02/13/14 0222  TROPONINI <0.30 <0.30 <0.30   BNP: No components found with this basename: POCBNP,  CBG:  Recent Labs  02/12/14 1115  GLUCAP 95    Radiological Exams: Dg Chest 2 View  02/12/2014   CLINICAL DATA:  Shortness of breath, weakness. History of hypertension.  EXAM: CHEST  2 VIEW  COMPARISON:  02/05/2014  FINDINGS: The heart is enlarged. There is perihilar bronchitic change. There has been improvement in aeration at  the right lung base. Persistent minimal densities are identified at both lung bases. There are small bilateral pleural effusions.  IMPRESSION: 1. Bronchitic changes. 2. Improved aeration at the right lung base. 3. Persistent minimal atelectasis or resolving infiltrates and small effusions.   Electronically Signed   By: Rosalie GumsBeth  Brown M.D.   On: 02/12/2014 11:14   Dg Chest 2 View  02/05/2014   CLINICAL DATA:  Shortness of breath.  Hypertension.  EXAM: CHEST  2 VIEW  COMPARISON:  06/27/2008.  FINDINGS: The heart remains grossly normal in size. No significant change in linear density and small amount of pleural fluid at the left lung base. Increased patchy density in pleural fluid at the right lung base. Stable mildly prominent interstitial markings. Diffuse osteopenia. Atheromatous arterial calcifications.  IMPRESSION: 1. Bibasilar atelectasis and possible pneumonia with bilateral pleural effusions, greater on the right. 2. Stable chronic interstitial lung disease.   Electronically Signed   By: Gordan PaymentSteve  Reid M.D.   On: 02/05/2014 12:05   Dg Lumbar Spine Complete  02/13/2014   CLINICAL DATA:  Left leg weakness.  Low back pain.  No injury.  EXAM: LUMBAR SPINE - COMPLETE 4+ VIEW  COMPARISON:  None.  FINDINGS: Diffuse degenerative disc disease and facet disease throughout the lumbar spine. Diffuse osteopenia. Mild compression an wedge deformity of L1 appears chronic. No subluxation. SI joints are symmetric and unremarkable. Mild rightward scoliosis of the lumbar spine.   IMPRESSION: Mild compression/ wedge deformity of the L1 vertebral body which appears chronic.  Mild rightward scoliosis. Diffuse degenerative disc and facet disease.  Osteopenia.  No acute findings.   Electronically Signed   By: Charlett NoseKevin  Dover M.D.   On: 02/13/2014 13:04   Ct Head Wo Contrast  02/12/2014   CLINICAL DATA:  Weakness  EXAM: CT HEAD WITHOUT CONTRAST  TECHNIQUE: Contiguous axial images were obtained from the base of the skull through the vertex without intravenous contrast.  COMPARISON:  08/12/2007  FINDINGS: There is atrophy and chronic small vessel disease changes. No acute intracranial abnormality. Specifically, no hemorrhage, hydrocephalus, mass lesion, acute infarction, or significant intracranial injury. No acute calvarial abnormality. Visualized paranasal sinuses and mastoids clear. Orbital soft tissues unremarkable.  IMPRESSION: No acute intracranial abnormality.  Atrophy, chronic microvascular disease.   Electronically Signed   By: Charlett NoseKevin  Dover M.D.   On: 02/12/2014 11:40   Mr Brain Wo Contrast  02/12/2014   CLINICAL DATA:  80 year old hypertensive female presenting with left leg weakness. Initial encounter.  EXAM: MRI HEAD WITHOUT CONTRAST  MRA HEAD WITHOUT CONTRAST  TECHNIQUE: Multiplanar, multiecho pulse sequences of the brain and surrounding structures were obtained without intravenous contrast. Angiographic images of the head were obtained using MRA technique without contrast.  COMPARISON:  02/04/2013 CT.  08/13/2007 MR.  FINDINGS: MRI HEAD FINDINGS  No acute infarct.  No intracranial hemorrhage.  Prominent small vessel disease type changes.  Global atrophy. Ventricular prominence felt to be related to atrophy rather than hydrocephalus.  No intracranial mass lesion noted on this unenhanced exam.  Cervical spondylotic changes with spinal stenosis and mild cord flattening C4-5 and C5-6. Transverse ligament hypertrophy.  Cervical medullary junction, pituitary region, pineal region and  orbital structures unremarkable.  MRA HEAD FINDINGS  Mild atherosclerotic type changes of the cavernous segment of the internal carotid artery bilaterally with areas of mild narrowing and ectasia.  Slightly prominent infundibulum at the level of the posterior communicating artery origin bilaterally without discrete saccular aneurysm.  Mild to moderate focal narrowing of the  A1 segment of the right anterior cerebral artery.  Mild middle cerebral artery branch vessel irregularity bilaterally.  Mild narrowing distal right vertebral artery.  Non visualization posterior inferior cerebellar arteries bilaterally and left anterior inferior cerebellar artery.  Mild ectasia basilar artery without high-grade stenosis.  Mild to moderate narrowing portions of the right posterior cerebral artery.  IMPRESSION: MRI HEAD:  No acute infarct.  Prominent small vessel disease type changes.  Global atrophy.  Cervical spondylotic changes with spinal stenosis and mild cord flattening C4-5 and C5-6.  MRA HEAD:  Intracranial atherosclerotic type changes most notable involving branch vessels as noted above.   Electronically Signed   By: Bridgett Larsson M.D.   On: 02/12/2014 20:28   Nm Pulmonary Perf And Vent  02/05/2014   CLINICAL DATA:  Shortness of breath for 1 week. Bilateral airspace opacities and pleural effusions on recent radiographs.  EXAM: NUCLEAR MEDICINE VENTILATION - PERFUSION LUNG SCAN  TECHNIQUE: Ventilation images were obtained in multiple projections using inhaled aerosol technetium 99 M DTPA. Perfusion images were obtained in multiple projections after intravenous injection of Tc-89m MAA.  RADIOPHARMACEUTICALS:  40.1 mCi Tc-62m DTPA aerosol and 5.5 mCi Tc-78m MAA  COMPARISON:  Radiographs 06/27/2008 and 02/05/2014.  FINDINGS: Ventilation: There is decreased ventilation in both lung bases. There is central clumping of the aerosol within the tracheobronchial tree.  Perfusion: Decreased perfusion to both lung bases is matched to  the ventilatory abnormalities and corresponds with bilateral pleural effusions. There are no segmental perfusion mismatches.  IMPRESSION: Low probability for acute pulmonary embolism. There are ventilation and perfusion defects in both lung bases corresponding with bilateral pleural effusions.   Electronically Signed   By: Roxy Horseman M.D.   On: 02/05/2014 15:30   Mr Maxine Glenn Head/brain Wo Cm  02/12/2014   CLINICAL DATA:  80 year old hypertensive female presenting with left leg weakness. Initial encounter.  EXAM: MRI HEAD WITHOUT CONTRAST  MRA HEAD WITHOUT CONTRAST  TECHNIQUE: Multiplanar, multiecho pulse sequences of the brain and surrounding structures were obtained without intravenous contrast. Angiographic images of the head were obtained using MRA technique without contrast.  COMPARISON:  02/04/2013 CT.  08/13/2007 MR.  FINDINGS: MRI HEAD FINDINGS  No acute infarct.  No intracranial hemorrhage.  Prominent small vessel disease type changes.  Global atrophy. Ventricular prominence felt to be related to atrophy rather than hydrocephalus.  No intracranial mass lesion noted on this unenhanced exam.  Cervical spondylotic changes with spinal stenosis and mild cord flattening C4-5 and C5-6. Transverse ligament hypertrophy.  Cervical medullary junction, pituitary region, pineal region and orbital structures unremarkable.  MRA HEAD FINDINGS  Mild atherosclerotic type changes of the cavernous segment of the internal carotid artery bilaterally with areas of mild narrowing and ectasia.  Slightly prominent infundibulum at the level of the posterior communicating artery origin bilaterally without discrete saccular aneurysm.  Mild to moderate focal narrowing of the A1 segment of the right anterior cerebral artery.  Mild middle cerebral artery branch vessel irregularity bilaterally.  Mild narrowing distal right vertebral artery.  Non visualization posterior inferior cerebellar arteries bilaterally and left anterior inferior  cerebellar artery.  Mild ectasia basilar artery without high-grade stenosis.  Mild to moderate narrowing portions of the right posterior cerebral artery.  IMPRESSION: MRI HEAD:  No acute infarct.  Prominent small vessel disease type changes.  Global atrophy.  Cervical spondylotic changes with spinal stenosis and mild cord flattening C4-5 and C5-6.  MRA HEAD:  Intracranial atherosclerotic type changes most notable involving branch vessels as noted  above.   Electronically Signed   By: Bridgett Larsson M.D.   On: 02/12/2014 20:28    Assessment/Plan  Left leg weakness Unclear etiology. Continue to work with therapy team and to continue her tapering course of prednsione  afib Rate controlled, continue diltiazem 120 mg daily and coumadin for goal inr 2-3  Allergic rhinitis Continue flonase and cetirizine  gerd Continue pantoprazole 40 mg daily  chf euvolemic on exam, continue lasix 20 mg daily  Family/ staff Communication: reviewed care plan with patient and nursing supervisor   Goals of care: STR  Labs/tests ordered: cbc    Oneal Grout, MD  [facility] [phone] (Monday-Friday 8 am - 5 pm) [phone] (afterhours)

## 2014-02-16 ENCOUNTER — Non-Acute Institutional Stay (SKILLED_NURSING_FACILITY): Payer: Medicare HMO | Admitting: Internal Medicine

## 2014-02-16 DIAGNOSIS — I482 Chronic atrial fibrillation, unspecified: Secondary | ICD-10-CM

## 2014-02-16 DIAGNOSIS — R29898 Other symptoms and signs involving the musculoskeletal system: Secondary | ICD-10-CM

## 2014-02-16 DIAGNOSIS — I5031 Acute diastolic (congestive) heart failure: Secondary | ICD-10-CM

## 2014-02-16 MED ORDER — PREDNISONE 5 MG PO TABS: ORAL_TABLET | ORAL | Status: DC

## 2014-02-16 NOTE — Progress Notes (Signed)
Patient ID: Candice Rangel, female   DOB: Dec 12, 1920, 80 y.o.   MRN: [id number]     Facility: [facility]  Chief complaint- discharge visit  Allergies reviewed  HPI 80 Y/o patient is seen today for discharge visit. Patient was here for short term rehabilitation and has worked with therapy team.  She will need therapy session with PT and OT but her daughter wants to take her home. She denies any concerns. Pain under control and feels her strength is coming back. She is on a wheelchair and in no distress  Review of system Constitutional: Negative for fever, chills HENT: Negative for congestion.  Respiratory: Negative for cough Cardiovascular: Negative for chest pain, palpitations, leg swelling.  Gastrointestinal: Negative for heartburn Genitourinary: Negative for dysuria Musculoskeletal: Negative for back pain, falls Skin: Negative for itching and rash.  Neurological: Negative for dizziness, tingling, focal weakness and headaches.  Psychiatric/Behavioral: Negative for depression   Medication reviewed. See [facility]   Medication List       This list is accurate as of: 02/16/14 11:12 AM.  Always use your most recent med list.               BREO ELLIPTA 200-25 MCG/INH Aepb  Generic drug:  Fluticasone Furoate-Vilanterol  Inhale 1 Inhaler into the lungs daily as needed (Shortness of Breath).     cetirizine 10 MG tablet  Commonly known as:  ZYRTEC  Take 10 mg by mouth daily.     diltiazem 120 MG 24 hr capsule  Commonly known as:  DILACOR XR  Take 1 capsule (120 mg total) by mouth daily.     furosemide 20 MG tablet  Commonly known as:  LASIX  Take 1 tablet (20 mg total) by mouth daily.     multivitamin tablet  Take 2 tablets by mouth daily.     pantoprazole 40 MG tablet  Commonly known as:  PROTONIX  Take 1 tablet (40 mg total) by mouth daily at 6 (six) AM.     polyethylene glycol packet  Commonly known as:  MIRALAX  Take 17 g by mouth daily.     predniSONE 5 MG tablet  Commonly known as:  DELTASONE  Take 10 mg daily until 02/19/14 and then 5 mg daily until 02/22/14     Vitamin C Chew  Chew 2 tablets by mouth daily.     warfarin 5 MG tablet  Commonly known as:  COUMADIN  Take 2.5 mg by mouth 4 (four) times a week.        Physical exam Vital signs stable, afebrile  General- elderly female in no acute distress Head- atraumatic, normocephalic Cardiovascular- normal s1,s2, no murmurs Respiratory- bilateral clear to auscultation, no wheeze, no rhonchi, no crackles Abdomen- bowel sounds present, soft, non tender Musculoskeletal- able to move all 4 extremities, still has some left sided weakness Neurological- no focal deficit Psychiatry- alert and oriented to person, place and time, normal mood and affect Skin- warm and dry  Assessment/plan  afib- rate controlled. Continue diltiazem and coumadin, goal inr 2-3  Left sided weakness- unclear etiology, on prednsione taper and doing good, have provided script for tapering course, pt to follow with pcp, has appointment  gerd- continue protonix 40 mg daily  chf- euvolemic, monitor clinically. Continue lasix  She has muscle weakness and chronic respiratory failure which impairs her ability to perform ADLs like grooming and dressing in home. A walker will not resolve this issue. A wheelchair will allow patient to safely perform  her ADLs. She can safely propel the wheelchair in her home   Home health services: home health PT, OT, RN for INR check  DME required: standard wheelchair with removable leg rests and removable desk arms  PCP follow up: dr Andrey Campanilewilson 02/21/14  30 days supply of prescription medications provided  Plan of care discussed with patient and verbalizes understanding this. Reviewed care plan with nursing staff

## 2014-02-21 LAB — MYASTHENIA GRAVIS PANEL 2: Acetylcholine Modulat Ab: 1 %

## 2014-10-27 IMAGING — CT CT HEAD W/O CM
1 series · 16 of 30 positions shown, 20 images · non-contrast
Comparison: 08/12/2007

CLINICAL DATA: Weakness

EXAM:
CT HEAD WITHOUT CONTRAST
TECHNIQUE: Contiguous axial images were obtained from the base of the skull
through the vertex without intravenous contrast.

[Series 2: head 5.0 h30s · axial · 0.41mm/px · z∈[-204,-59]mm · 16 of 33 slices shown, 20 images]
[im 2/33  brain]
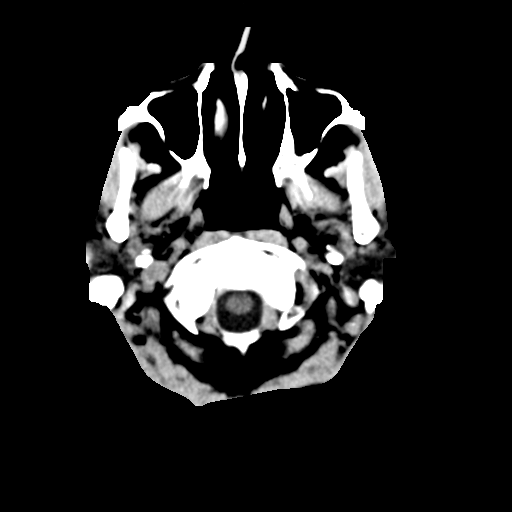
[im 2/33  bone]
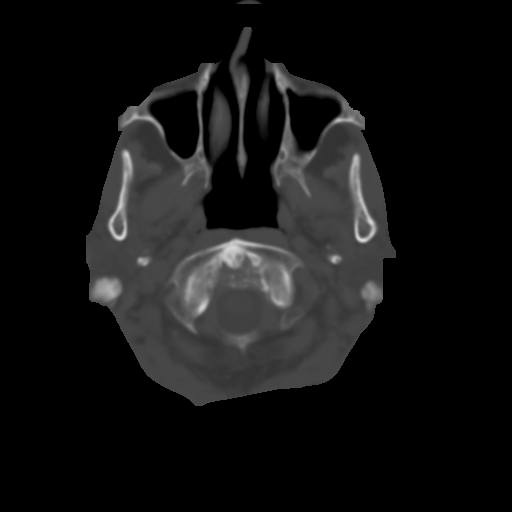
[im 4/33  brain]
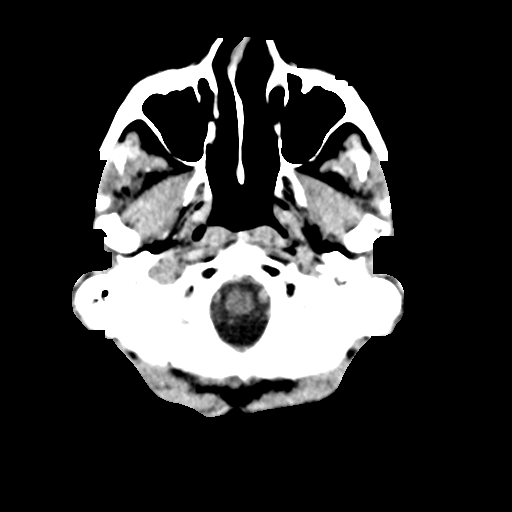
[im 6/33  brain]
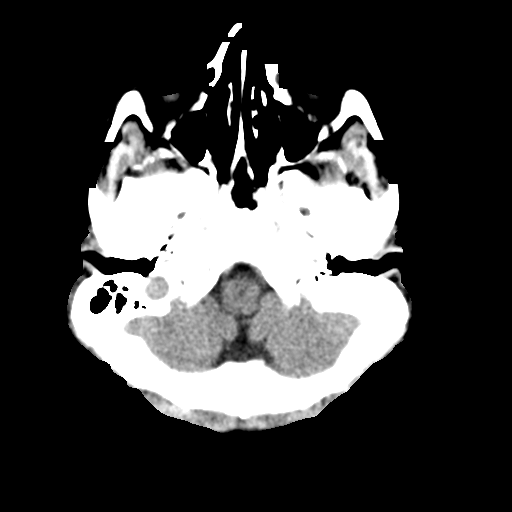
[im 8/33  brain]
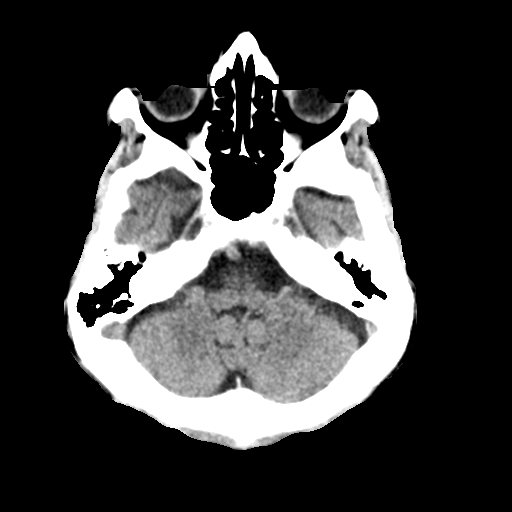
[im 9/33  brain]
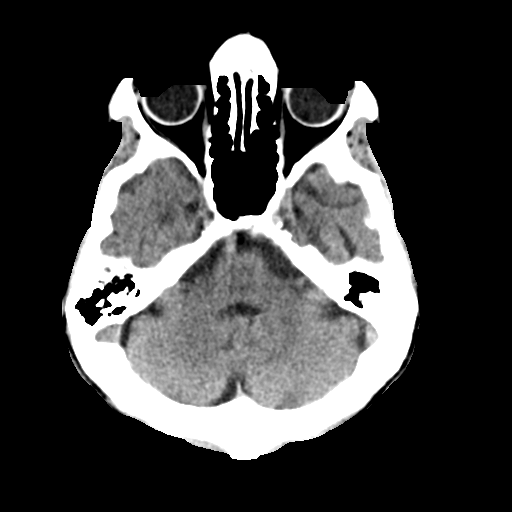
[im 9/33  bone]
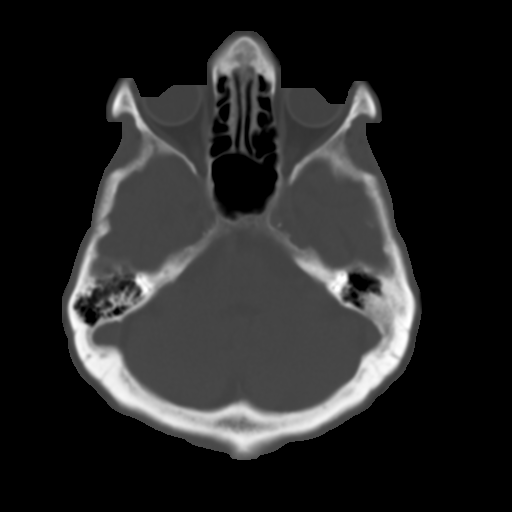
[im 12/33  brain]
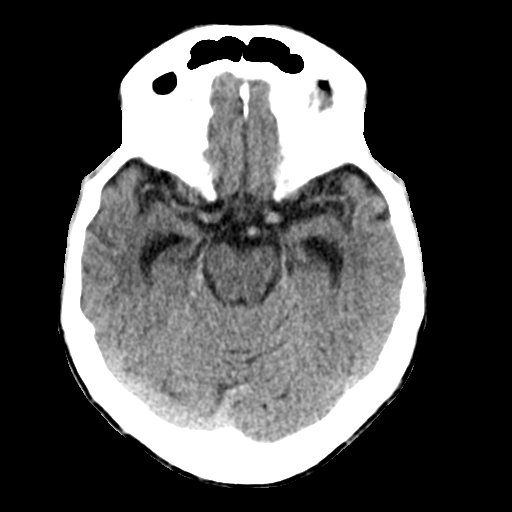
[im 14/33  brain]
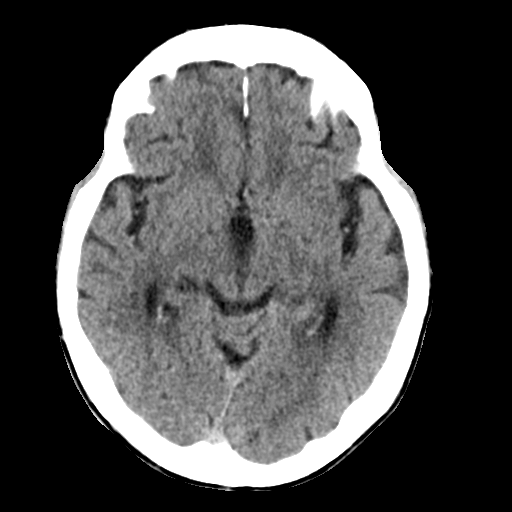
[im 16/33  brain]
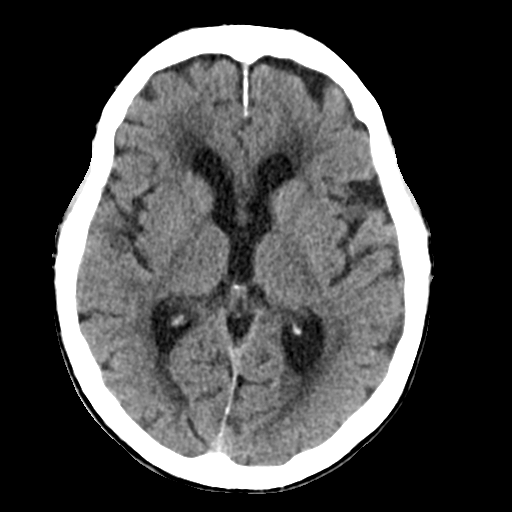
[im 17/33  brain]
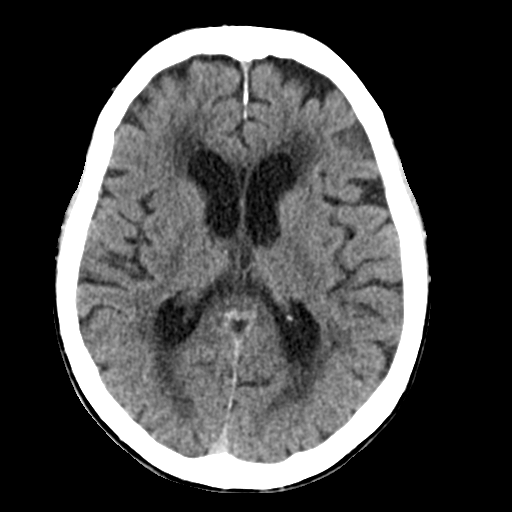
[im 17/33  bone]
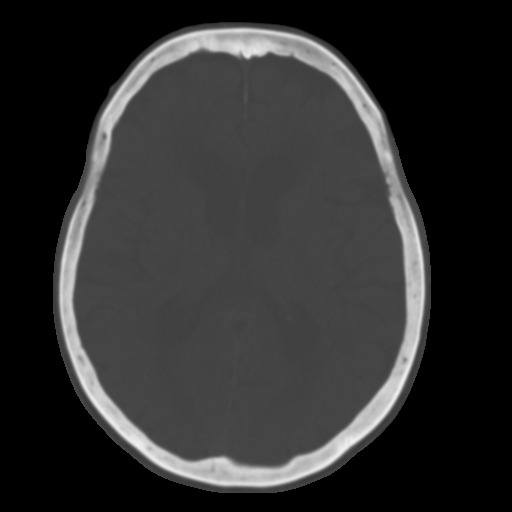
[im 19/33  brain]
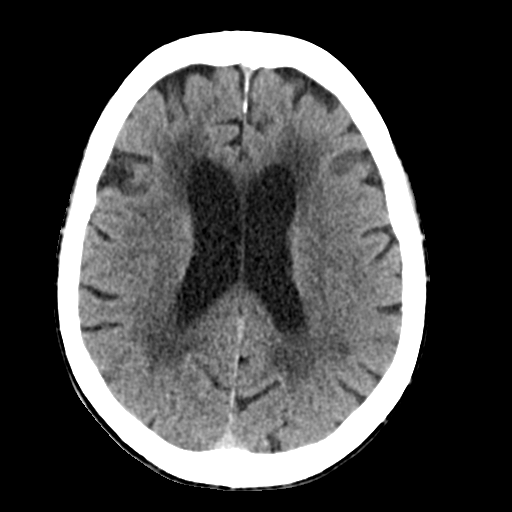
[im 21/33  brain]
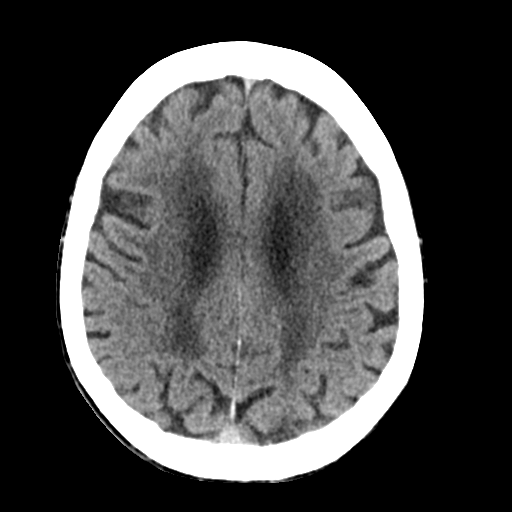
[im 24/33  brain]
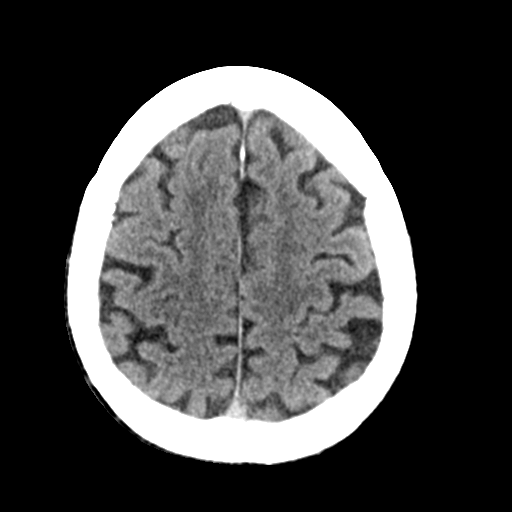
[im 25/33  brain]
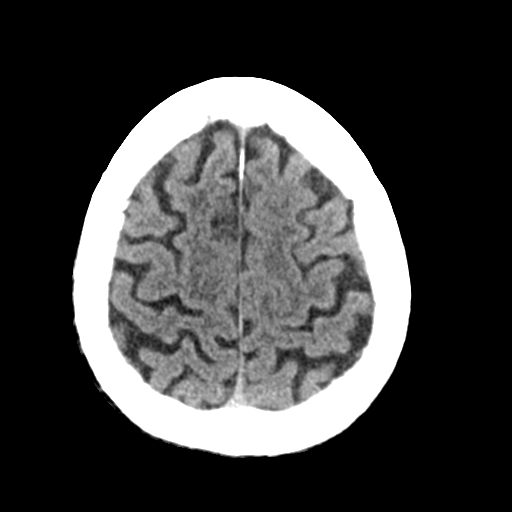
[im 25/33  bone]
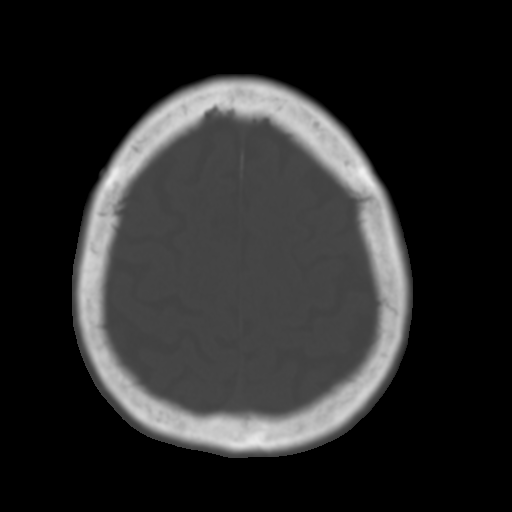
[im 27/33  brain]
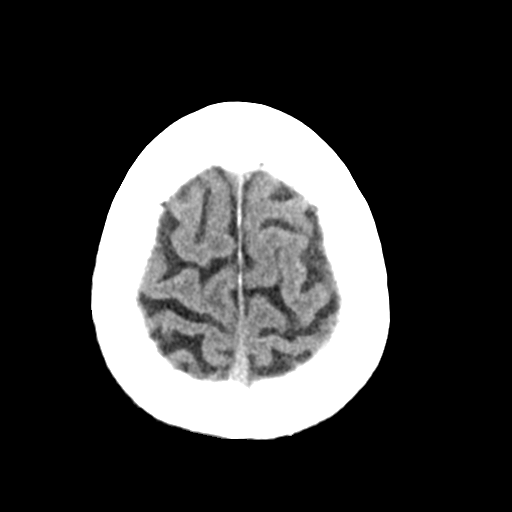
[im 29/33  brain]
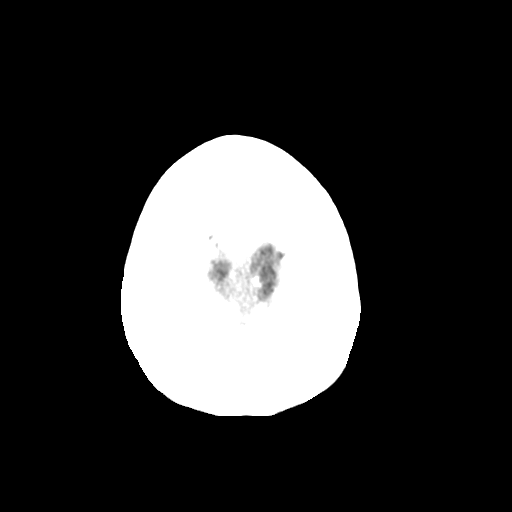
[im 31/33  brain]
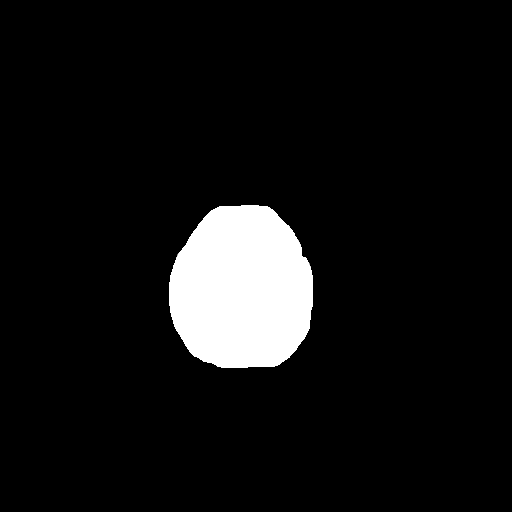

[16 of 30 positions shown; findings below may reference images not displayed]

FINDINGS: There is atrophy and chronic small vessel disease changes. No acute
intracranial abnormality. Specifically, no hemorrhage,
hydrocephalus, mass lesion, acute infarction, or significant
intracranial injury. No acute calvarial abnormality. Visualized
paranasal sinuses and mastoids clear. Orbital soft tissues
unremarkable.
IMPRESSION: No acute intracranial abnormality.

Atrophy, chronic microvascular disease.

## 2014-10-27 IMAGING — CR DG CHEST 2V
2 series · 2 of 2 positions shown · non-contrast
Comparison: 02/05/2014

CLINICAL DATA: Shortness of breath, weakness. History of
hypertension.

EXAM:
CHEST  2 VIEW

[w chest lat]
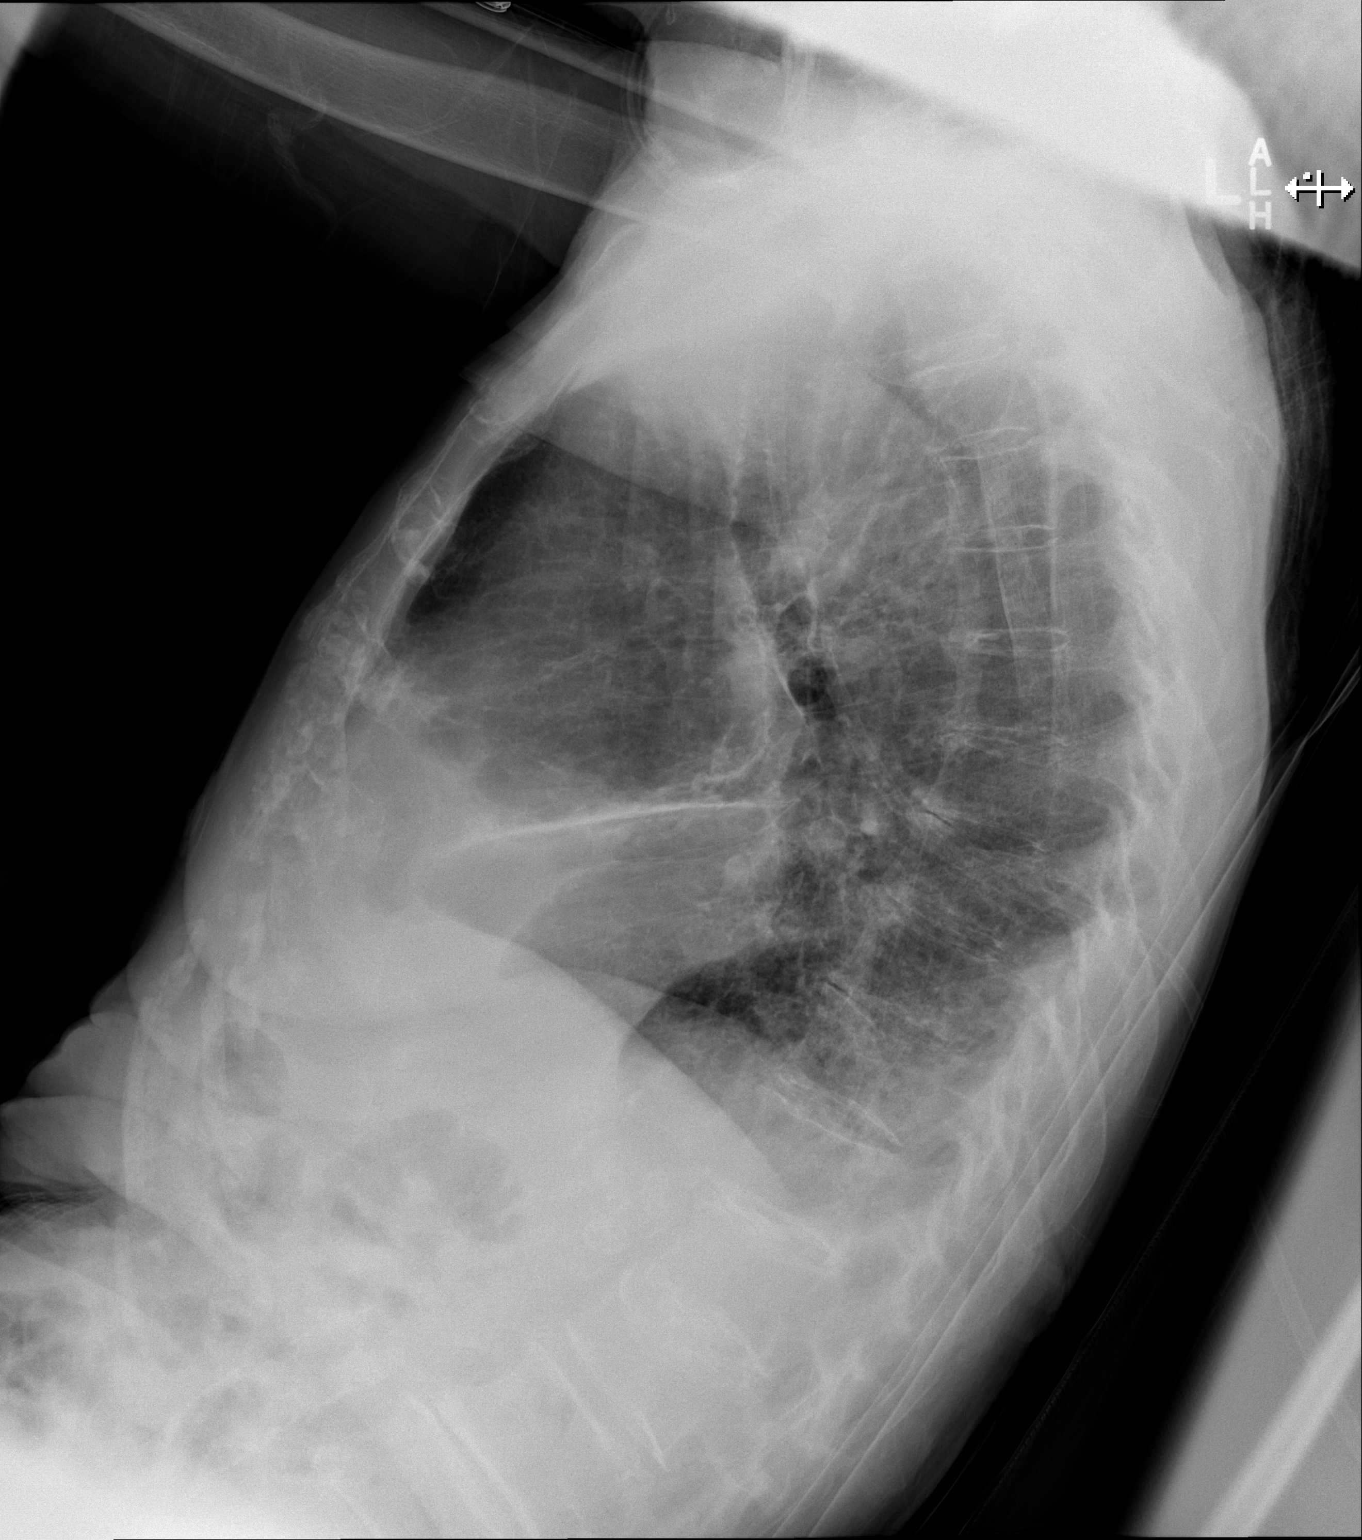

[x chest ap]
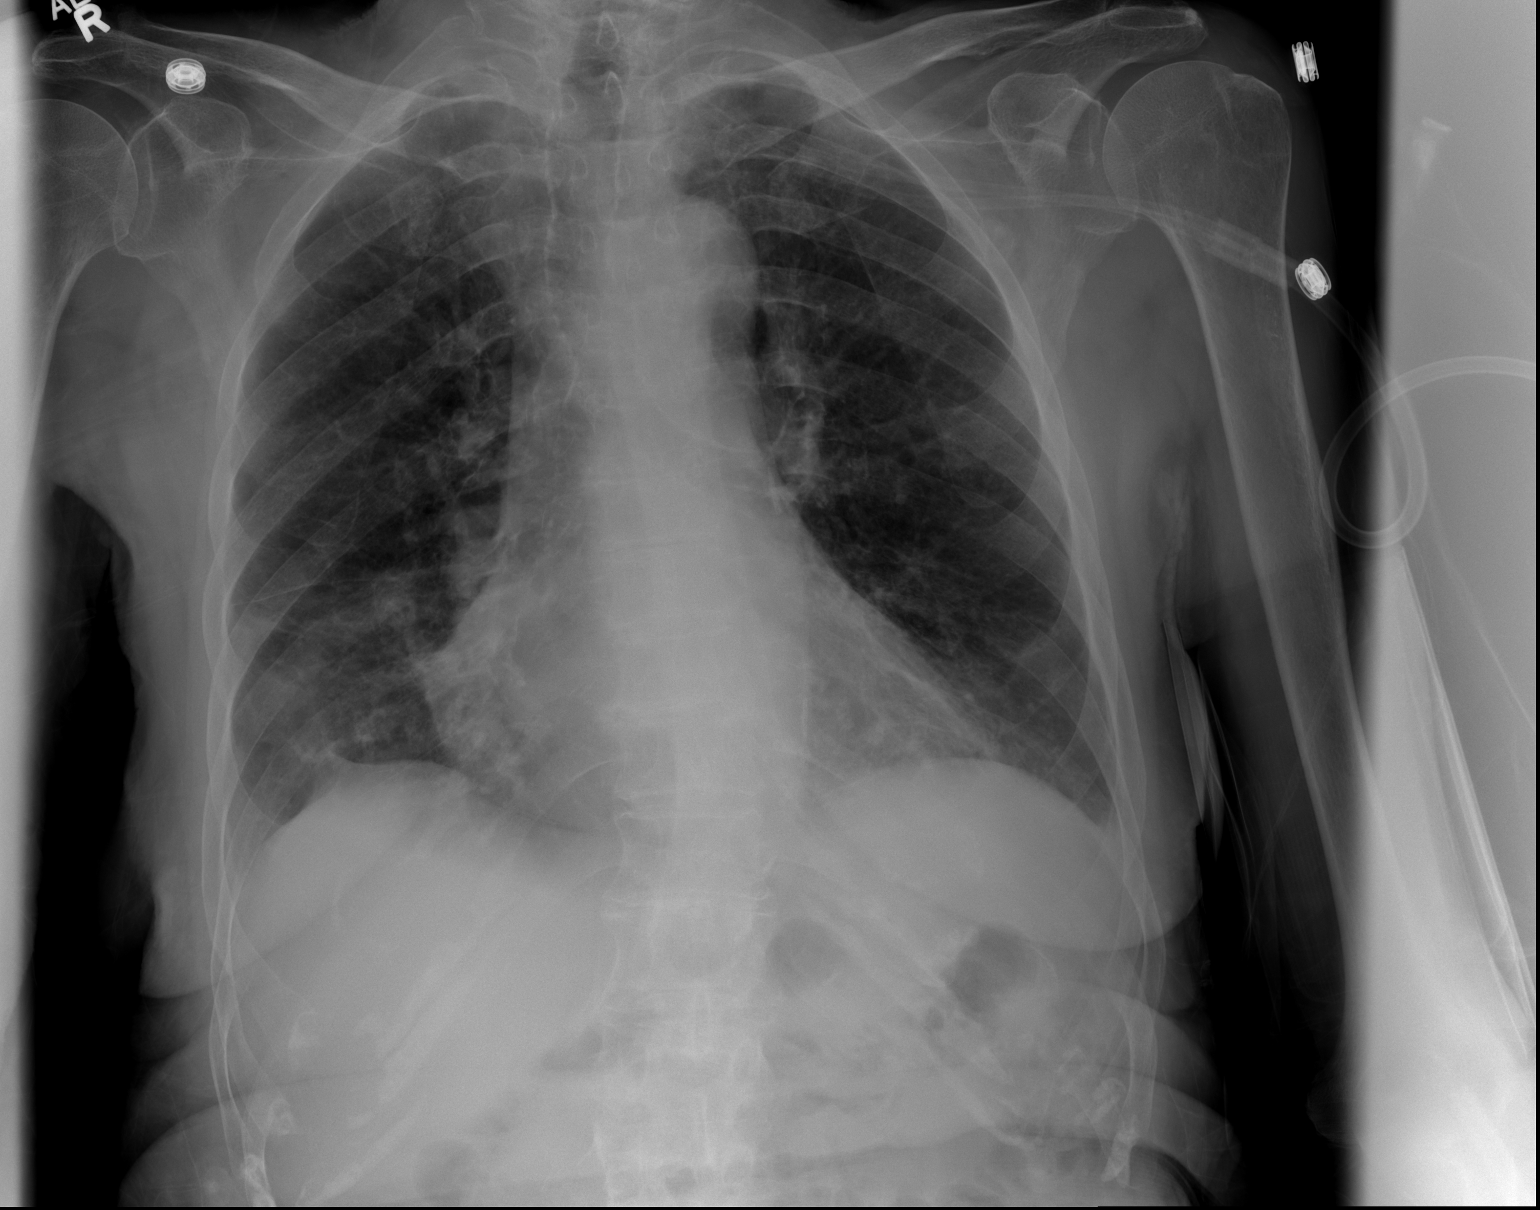

[2 of 2 positions shown; findings below may reference images not displayed]

FINDINGS: The heart is enlarged. There is perihilar bronchitic change. There
has been improvement in aeration at the right lung base. Persistent
minimal densities are identified at both lung bases. There are small
bilateral pleural effusions.
IMPRESSION: 1. Bronchitic changes.
2. Improved aeration at the right lung base.
3. Persistent minimal atelectasis or resolving infiltrates and small
effusions.

## 2014-11-30 ENCOUNTER — Telehealth: Payer: Self-pay | Admitting: Cardiology

## 2014-11-30 NOTE — Telephone Encounter (Signed)
Pt reports swelling of left foot x 6 days - eval'd by PCP today. She wanted advice on what to do. Has appt for 8/31 w/ Dr. Jens Somrenshaw.   She is on lasix. Her lasix was increased by PCP today.  Advised start increased dose as recommended by PCP, call us if not better after 2-3 days. Pt voiced understanding.

## 2014-11-30 NOTE — Telephone Encounter (Signed)
New Message  Pt called to make next avail w/ Crenshaw @ [place]. Pt c/o of swelling in her feet and was seen by her PCP for SOB today 7/15 and was told to make appt w/ Dr.Crenshaw. Pt requested to speak w/ RN about current condition and waht to do going forward. Please call back and discuss.

## 2014-12-13 NOTE — Progress Notes (Signed)
HPI: FU atrial fibrillation. Syncope 2009. Myovue on September 15, 2007 showed no ischemia or infarction, and her ejection fraction was 78%. She also had a CardioNet monitor. There was atrial fibrillation noted which was chronic, but there were no significant pauses. Echocardiogram 9/15 showed normal LV function; restrictive filling, mild biatrial enlargement, mild MR, secundum ASD. Since I last saw her, the patient denies any dyspnea on exertion, orthopnea, PND, pedal edema, palpitations, syncope or chest pain.   Current Outpatient Prescriptions  Medication Sig Dispense Refill  . Bioflavonoid Products (VITAMIN C) CHEW Chew 2 tablets by mouth daily.    . cetirizine (ZYRTEC) 10 MG tablet Take 10 mg by mouth daily.    . digoxin (LANOXIN) 0.125 MG tablet Take 125 mcg by mouth daily.  0  . diltiazem (DILACOR XR) 120 MG 24 hr capsule Take 1 capsule (120 mg total) by mouth daily. 30 capsule 0  . Fluticasone Furoate-Vilanterol (BREO ELLIPTA) 200-25 MCG/INH AEPB Inhale 1 Inhaler into the lungs daily as needed (Shortness of Breath).     . furosemide (LASIX) 20 MG tablet Take 1 tablet (20 mg total) by mouth daily. 30 tablet 0  . Multiple Vitamin (MULTIVITAMIN) tablet Take 2 tablets by mouth daily.     . pantoprazole (PROTONIX) 40 MG tablet Take 1 tablet (40 mg total) by mouth daily at 6 (six) AM. 30 tablet 0  . polyethylene glycol (MIRALAX) packet Take 17 g by mouth daily. 14 each 0  . warfarin (COUMADIN) 5 MG tablet Take 2.5 mg by mouth 4 (four) times a week.     No current facility-administered medications for this visit.     Past Medical History  Diagnosis Date  . Hypertension   . Atrial fibrillation   . Syncope   . Drug therapy     coumadin  . Bronchiectasis     History of  . Pneumonia "lots of times"  . Arthritis     "back" (02/12/2014)  . On home oxygen therapy     "prn for the last week" (02/12/2014)    Past Surgical History  Procedure Laterality Date  . Knee arthrocentesis  Left 07/2007    and injection  . Tonsillectomy    . Cesarean section  1958  . Femur fracture surgery Right     Following a trauma  . Cataract extraction, bilateral Bilateral   . Fracture surgery    . Hip fracture surgery Right     pt denies this hx on 02/12/2014 "It was my leg, not my hip"    History   Social History  . Marital Status: Married    Spouse Name: N/A  . Number of Children: N/A  . Years of Education: N/A   Occupational History  . [employer] of [employer]    Social History Main Topics  . Smoking status: Never Smoker   . Smokeless tobacco: Never Used  . Alcohol Use: No  . Drug Use: No  . Sexual Activity: Not on file   Other Topics Concern  . Not on file   Social History Narrative   Married    ROS: no fevers or chills, productive cough, hemoptysis, dysphasia, odynophagia, melena, hematochezia, dysuria, hematuria, rash, seizure activity, orthopnea, PND, pedal edema, claudication. Remaining systems are negative.  Physical Exam: Well-developed well-nourished in no acute distress.  Skin is warm and dry.  HEENT is normal.  Neck is supple.  Chest is clear to auscultation with normal expansion.  Cardiovascular exam is irregular Abdominal  exam nontender or distended. No masses palpated. Extremities show no edema. neuro grossly intact  ECG atrial fibrillation, cannot rule out prior septal infarct, nonspecific ST changes.

## 2014-12-17 ENCOUNTER — Ambulatory Visit (INDEPENDENT_AMBULATORY_CARE_PROVIDER_SITE_OTHER): Payer: Medicare HMO | Admitting: Cardiology

## 2014-12-17 ENCOUNTER — Encounter: Payer: Self-pay | Admitting: Cardiology

## 2014-12-17 VITALS — BP 158/72 | HR 67 | Ht 61.0 in | Wt 108.0 lb

## 2014-12-17 DIAGNOSIS — Q211 Atrial septal defect: Secondary | ICD-10-CM | POA: Diagnosis not present

## 2014-12-17 DIAGNOSIS — I10 Essential (primary) hypertension: Secondary | ICD-10-CM | POA: Diagnosis not present

## 2014-12-17 DIAGNOSIS — I482 Chronic atrial fibrillation, unspecified: Secondary | ICD-10-CM

## 2014-12-17 DIAGNOSIS — Q2111 Secundum atrial septal defect: Secondary | ICD-10-CM

## 2014-12-17 NOTE — Assessment & Plan Note (Signed)
Blood pressure mildly elevated.however she states typically controlled. Continue present medications and follow.

## 2014-12-17 NOTE — Assessment & Plan Note (Signed)
Conservative measures given age. 

## 2014-12-17 NOTE — Patient Instructions (Signed)
Your physician wants you to follow-up in: ONE YEAR WITH DR CRENSHAW You will receive a reminder letter in the mail two months in advance. If you don't receive a letter, please call our office to schedule the follow-up appointment.  

## 2014-12-17 NOTE — Assessment & Plan Note (Signed)
Continue Cardizem. Continue Coumadin.

## 2014-12-27 ENCOUNTER — Ambulatory Visit: Payer: Medicare HMO | Admitting: Cardiology

## 2015-01-08 ENCOUNTER — Emergency Department (HOSPITAL_COMMUNITY): Payer: Medicare HMO

## 2015-01-08 ENCOUNTER — Encounter (HOSPITAL_COMMUNITY): Payer: Self-pay

## 2015-01-08 ENCOUNTER — Emergency Department (HOSPITAL_COMMUNITY)
Admission: EM | Admit: 2015-01-08 | Discharge: 2015-01-08 | Disposition: A | Discharge status: Home / Self Care | Payer: Medicare HMO | Attending: Emergency Medicine | Admitting: Emergency Medicine

## 2015-01-08 DIAGNOSIS — Z9981 Dependence on supplemental oxygen: Secondary | ICD-10-CM | POA: Insufficient documentation

## 2015-01-08 DIAGNOSIS — Z8701 Personal history of pneumonia (recurrent): Secondary | ICD-10-CM | POA: Insufficient documentation

## 2015-01-08 DIAGNOSIS — M1990 Unspecified osteoarthritis, unspecified site: Secondary | ICD-10-CM | POA: Insufficient documentation

## 2015-01-08 DIAGNOSIS — I509 Heart failure, unspecified: Secondary | ICD-10-CM | POA: Insufficient documentation

## 2015-01-08 DIAGNOSIS — Z880 Allergy status to penicillin: Secondary | ICD-10-CM | POA: Insufficient documentation

## 2015-01-08 DIAGNOSIS — R0902 Hypoxemia: Secondary | ICD-10-CM | POA: Diagnosis not present

## 2015-01-08 DIAGNOSIS — Z7901 Long term (current) use of anticoagulants: Secondary | ICD-10-CM | POA: Diagnosis not present

## 2015-01-08 DIAGNOSIS — Z79899 Other long term (current) drug therapy: Secondary | ICD-10-CM | POA: Insufficient documentation

## 2015-01-08 DIAGNOSIS — I10 Essential (primary) hypertension: Secondary | ICD-10-CM | POA: Insufficient documentation

## 2015-01-08 DIAGNOSIS — R0602 Shortness of breath: Secondary | ICD-10-CM | POA: Diagnosis present

## 2015-01-08 LAB — CBC WITH DIFFERENTIAL/PLATELET
BASOS ABS: 0 10*3/uL (ref 0.0–0.1)
Basophils Relative: 1 % (ref 0–1)
Eosinophils Absolute: 0 10*3/uL (ref 0.0–0.7)
Eosinophils Relative: 0 % (ref 0–5)
HCT: 46.7 % — ABNORMAL HIGH (ref 36.0–46.0)
Hemoglobin: 15.6 g/dL — ABNORMAL HIGH (ref 12.0–15.0)
Lymphocytes Relative: 25 % (ref 12–46)
Lymphs Abs: 1.4 10*3/uL (ref 0.7–4.0)
MCH: 31.6 pg (ref 26.0–34.0)
MCHC: 33.4 g/dL (ref 30.0–36.0)
MCV: 94.5 fL (ref 78.0–100.0)
MONOS PCT: 16 % — AB (ref 3–12)
Monocytes Absolute: 0.9 10*3/uL (ref 0.1–1.0)
NEUTROS ABS: 3.3 10*3/uL (ref 1.7–7.7)
NEUTROS PCT: 58 % (ref 43–77)
Platelets: 138 10*3/uL — ABNORMAL LOW (ref 150–400)
RBC: 4.94 MIL/uL (ref 3.87–5.11)
RDW: 16.3 % — ABNORMAL HIGH (ref 11.5–15.5)
SMEAR REVIEW: DECREASED
WBC: 5.7 10*3/uL (ref 4.0–10.5)

## 2015-01-08 LAB — URINALYSIS, ROUTINE W REFLEX MICROSCOPIC
BILIRUBIN URINE: NEGATIVE
Glucose, UA: NEGATIVE mg/dL
KETONES UR: NEGATIVE mg/dL
Leukocytes, UA: NEGATIVE
NITRITE: NEGATIVE
Protein, ur: 30 mg/dL — AB
Specific Gravity, Urine: 1.012 (ref 1.005–1.030)
UROBILINOGEN UA: 0.2 mg/dL (ref 0.0–1.0)
pH: 6.5 (ref 5.0–8.0)

## 2015-01-08 LAB — BRAIN NATRIURETIC PEPTIDE: B NATRIURETIC PEPTIDE 5: 281.6 pg/mL — AB (ref 0.0–100.0)

## 2015-01-08 LAB — COMPREHENSIVE METABOLIC PANEL
ALT: 25 U/L (ref 14–54)
AST: 32 U/L (ref 15–41)
Albumin: 2.6 g/dL — ABNORMAL LOW (ref 3.5–5.0)
Alkaline Phosphatase: 45 U/L (ref 38–126)
Anion gap: 7 (ref 5–15)
BUN: 16 mg/dL (ref 6–20)
CHLORIDE: 101 mmol/L (ref 101–111)
CO2: 32 mmol/L (ref 22–32)
Calcium: 8.6 mg/dL — ABNORMAL LOW (ref 8.9–10.3)
Creatinine, Ser: 1.11 mg/dL — ABNORMAL HIGH (ref 0.44–1.00)
GFR calc Af Amer: 49 mL/min — ABNORMAL LOW (ref >60)
GFR, EST NON AFRICAN AMERICAN: 42 mL/min — AB (ref >60)
Glucose, Bld: 91 mg/dL (ref 65–99)
POTASSIUM: 4.1 mmol/L (ref 3.5–5.1)
SODIUM: 140 mmol/L (ref 135–145)
Total Bilirubin: 0.7 mg/dL (ref 0.3–1.2)
Total Protein: 6.9 g/dL (ref 6.5–8.1)

## 2015-01-08 LAB — URINE MICROSCOPIC-ADD ON

## 2015-01-08 LAB — I-STAT CG4 LACTIC ACID, ED: LACTIC ACID, VENOUS: 1.45 mmol/L (ref 0.5–2.0)

## 2015-01-08 LAB — TROPONIN I: TROPONIN I: 0.03 ng/mL (ref <0.031)

## 2015-01-08 NOTE — ED Notes (Signed)
Per [facility]- Pt. Traveling with daughter via bus to [place] two weeks ago. Four days ago Pt. Was hospitalized in [place] for 3 days with a diagnosis of pneumonia. Pt. Was given abx as tx (levoquin). Pt. And daughter arrived back in [place] last night. EMS called to residence this morning because the Pt. Daughter reported SOB and neck pain. Pt. Also reports still feeling bad. No neck pain/stiffness. No n/v/ha.

## 2015-01-08 NOTE — ED Provider Notes (Signed)
CSN: [id number]     Arrival date & time 01/08/15  0807 History   First MD Initiated Contact with Patient 01/08/15 [phone]     Chief Complaint  Patient presents with  . Shortness of Breath     (Consider location/radiation/quality/duration/timing/severity/associated sxs/prior Treatment) HPI Patient was taking a trip to [place] 2 weeks ago. She began to develop a very dry hacking cough and shortness of breath. Her daughter with whom she was traveling, had also developed fever and cough at the same time. She presented to a hospital in [place] and had a 3 day hospitalization, she was discharged on the 20th with a prescription for Levaquin. The patient reports that she was not discharged on any oxygen and does not have a baseline oxygen requirement. The patient had 12 hours of travel to get back and only returned last night. This morning she was more short of breath again and reported some pain into her neck. The patient poor she is still feeling very fatigued but denies general myalgias. She is denying fever. She denies lower extremity pain or swelling. Have a history of atrial fibrillation and CHF. Past Medical History  Diagnosis Date  . Hypertension   . Atrial fibrillation   . Syncope   . Drug therapy     coumadin  . Bronchiectasis     History of  . Pneumonia "lots of times"  . Arthritis     "back" (02/12/2014)  . On home oxygen therapy     "prn for the last week" (02/12/2014)   Past Surgical History  Procedure Laterality Date  . Knee arthrocentesis Left 07/2007    and injection  . Tonsillectomy    . Cesarean section  1958  . Femur fracture surgery Right     Following a trauma  . Cataract extraction, bilateral Bilateral   . Fracture surgery    . Hip fracture surgery Right     pt denies this hx on 02/12/2014 "It was my leg, not my hip"   Family History  Problem Relation Age of Onset  . Heart attack Mother 86  . Pneumonia Father    Social History  Substance Use Topics  . Smoking  status: Never Smoker   . Smokeless tobacco: Never Used  . Alcohol Use: No   OB History    No data available     Review of Systems 10 Systems reviewed and are negative for acute change except as noted in the HPI.    Allergies  Cephalexin; Other; and Penicillins  Home Medications   Prior to Admission medications   Medication Sig Start Date End Date Taking? Authorizing Provider  Bioflavonoid Products (VITAMIN C) CHEW Chew 2 tablets by mouth daily.   Yes Historical Provider, MD  cetirizine (ZYRTEC) 10 MG tablet Take 10 mg by mouth daily.   Yes Historical Provider, MD  digoxin (LANOXIN) 0.125 MG tablet Take 125 mcg by mouth daily. 10/31/14  Yes Historical Provider, MD  diltiazem (DILACOR XR) 120 MG 24 hr capsule Take 1 capsule (120 mg total) by mouth daily. 02/08/14  Yes Renae Fickle, MD  Fluticasone Furoate-Vilanterol (BREO ELLIPTA) 200-25 MCG/INH AEPB Inhale 1 Inhaler into the lungs daily as needed (Shortness of Breath).    Yes Historical Provider, MD  furosemide (LASIX) 20 MG tablet Take 1 tablet (20 mg total) by mouth daily. 02/14/14  Yes Belkys A Regalado, MD  levofloxacin (LEVAQUIN) 750 MG tablet Take 750 mg by mouth every other day. Every other day till finished  Yes Historical Provider, MD  Multiple Vitamin (MULTIVITAMIN) tablet Take 2 tablets by mouth daily.    Yes Historical Provider, MD  pantoprazole (PROTONIX) 40 MG tablet Take 1 tablet (40 mg total) by mouth daily at 6 (six) AM. 02/14/14  Yes Belkys A Regalado, MD  polyethylene glycol (MIRALAX) packet Take 17 g by mouth daily. 02/14/14  Yes Belkys A Regalado, MD  warfarin (COUMADIN) 5 MG tablet Take 2.5 mg by mouth See admin instructions. Patient is taking 3 times weekly. Monday Wednesday Friday,   Yes Historical Provider, MD   BP 158/82 mmHg  Pulse 96  Temp(Src) 98 F (36.7 C) (Oral)  Resp 20  Ht 5\' 2"  (1.575 m)  Wt 108 lb (48.988 kg)  BMI 19.75 kg/m2  SpO2 98% Physical Exam  Constitutional: She is oriented to  person, place, and time. She appears well-developed and well-nourished.  HENT:  Head: Normocephalic and atraumatic.  Eyes: EOM are normal. Pupils are equal, round, and reactive to light.  Neck: Neck supple.  Cardiovascular: Normal rate, regular rhythm, normal heart sounds and intact distal pulses.   Pulmonary/Chest: Effort normal. No respiratory distress. She has rales.  Patient has by basilar rails. Adequate air flow. Breath sounds are otherwise soft through the lung fields.  Abdominal: Soft. Bowel sounds are normal. She exhibits no distension. There is no tenderness.  Musculoskeletal: Normal range of motion. She exhibits edema.  Patient has only trace edema at the ankles. Her calves are soft and nontender.  Neurological: She is alert and oriented to person, place, and time. She has normal strength. Coordination normal. GCS eye subscore is 4. GCS verbal subscore is 5. GCS motor subscore is 6.  Skin: Skin is warm, dry and intact.  Psychiatric: She has a normal mood and affect.    ED Course  Procedures (including critical care time) Labs Review Labs Reviewed  COMPREHENSIVE METABOLIC PANEL - Abnormal; Notable for the following:    Creatinine, Ser 1.11 (*)    Calcium 8.6 (*)    Albumin 2.6 (*)    GFR calc non Af Amer 42 (*)    GFR calc Af Amer 49 (*)    All other components within normal limits  BRAIN NATRIURETIC PEPTIDE - Abnormal; Notable for the following:    B Natriuretic Peptide 281.6 (*)    All other components within normal limits  CBC WITH DIFFERENTIAL/PLATELET - Abnormal; Notable for the following:    Hemoglobin 15.6 (*)    HCT 46.7 (*)    RDW 16.3 (*)    Platelets 138 (*)    Monocytes Relative 16 (*)    All other components within normal limits  URINALYSIS, ROUTINE W REFLEX MICROSCOPIC (NOT AT [facility]) - Abnormal; Notable for the following:    Hgb urine dipstick SMALL (*)    Protein, ur 30 (*)    All other components within normal limits  CULTURE, BLOOD (ROUTINE X 2)   CULTURE, BLOOD (ROUTINE X 2)  TROPONIN I  URINE MICROSCOPIC-ADD ON  I-STAT CG4 LACTIC ACID, ED    Imaging Review Dg Chest 2 View  01/08/2015   CLINICAL DATA:  Congestion and cough for few days.  Dyspnea.  EXAM: CHEST  2 VIEW  COMPARISON:  02/12/2014  FINDINGS: New densities at the left lung base are suggestive for pleural fluid and consolidation. Slightly prominent interstitial lung markings could represent mild edema. Heart size is within normal limits. Atherosclerotic calcifications at the aortic arch. Small right pleural effusion. Old compression deformity near the upper  lumbar spine.  IMPRESSION: Small bilateral pleural effusions, left side greater than right.  Question mild interstitial edema.   Electronically Signed   By: Richarda Overlie M.D.   On: 01/08/2015 11:40   I have personally reviewed and evaluated these images and lab results as part of my medical decision-making.   EKG Interpretation   Date/Time:  Tuesday January 08 2015 08:13:25 EDT Ventricular Rate:  100 PR Interval:    QRS Duration: 82 QT Interval:  324 QTC Calculation: 418 R Axis:   52 Text Interpretation:  Atrial fibrillation Ventricular premature complex  Probable anterior infarct, age indeterminate no change Confirmed by  Donnald Garre, MD, Le[facility] Conners [phone]) on 01/08/2015 10:06:46 AM      MDM   Final diagnoses:  Acute on chronic congestive heart failure, unspecified congestive heart failure type  Hypoxia   The patient did not wish to stay in the hospital. She does have a follow-up family physician appointment today. She is with her daughters who are going to discuss home oxygen with their provider. The patient is alert and appropriate. She does not have respiratory distress at rest. Of oxygen her O2 level stayed at 89-91%. She did not develop respiratory distress off of oxygen. At this time the patient will be discharged to close follow-up. She is advised to discuss congestive heart failure with her provider. They can  determine increasing her Lasix dose for a limited amount of time. It does appear the underlying illness was most likely a bronchitis or pneumonia with a mild exacerbation of CHF at this time.   Arby Barrette, MD 01/08/15 [phone]

## 2015-01-08 NOTE — Discharge Instructions (Signed)
Heart Failure °Heart failure is a condition in which the heart has trouble pumping blood. This means your heart does not pump blood efficiently for your body to work well. In some cases of heart failure, fluid may back up into your lungs or you may have swelling (edema) in your lower legs. Heart failure is usually a long-term (chronic) condition. It is important for you to take good care of yourself and follow your health care provider's treatment plan. °CAUSES  °Some health conditions can cause heart failure. Those health conditions include: °· High blood pressure (hypertension). Hypertension causes the heart muscle to work harder than normal. When pressure in the blood vessels is high, the heart needs to pump (contract) with more force in order to circulate blood throughout the body. High blood pressure eventually causes the heart to become stiff and weak. °· Coronary artery disease (CAD). CAD is the buildup of cholesterol and fat (plaque) in the arteries of the heart. The blockage in the arteries deprives the heart muscle of oxygen and blood. This can cause chest pain and may lead to a heart attack. High blood pressure can also contribute to CAD. °· Heart attack (myocardial infarction). A heart attack occurs when one or more arteries in the heart become blocked. The loss of oxygen damages the muscle tissue of the heart. When this happens, part of the heart muscle dies. The injured tissue does not contract as well and weakens the heart's ability to pump blood. °· Abnormal heart valves. When the heart valves do not open and close properly, it can cause heart failure. This makes the heart muscle pump harder to keep the blood flowing. °· Heart muscle disease (cardiomyopathy or myocarditis). Heart muscle disease is damage to the heart muscle from a variety of causes. These can include drug or alcohol abuse, infections, or unknown reasons. These can increase the risk of heart failure. °· Lung disease. Lung disease  makes the heart work harder because the lungs do not work properly. This can cause a strain on the heart, leading it to fail. °· Diabetes. Diabetes increases the risk of heart failure. High blood sugar contributes to high fat (lipid) levels in the blood. Diabetes can also cause slow damage to tiny blood vessels that carry important nutrients to the heart muscle. When the heart does not get enough oxygen and food, it can cause the heart to become weak and stiff. This leads to a heart that does not contract efficiently. °· Other conditions can contribute to heart failure. These include abnormal heart rhythms, thyroid problems, and low blood counts (anemia). °Certain unhealthy behaviors can increase the risk of heart failure, including: °· Being overweight. °· Smoking or chewing tobacco. °· Eating foods high in fat and cholesterol. °· Abusing illicit drugs or alcohol. °· Lacking physical activity. °SYMPTOMS  °Heart failure symptoms may vary and can be hard to detect. Symptoms may include: °· Shortness of breath with activity, such as climbing stairs. °· Persistent cough. °· Swelling of the feet, ankles, legs, or abdomen. °· Unexplained weight gain. °· Difficulty breathing when lying flat (orthopnea). °· Waking from sleep because of the need to sit up and get more air. °· Rapid heartbeat. °· Fatigue and loss of energy. °· Feeling light-headed, dizzy, or close to fainting. °· Loss of appetite. °· Nausea. °· Increased urination during the night (nocturia). °DIAGNOSIS  °A diagnosis of heart failure is based on your history, symptoms, physical examination, and diagnostic tests. Diagnostic tests for heart failure may include: °·   Echocardiography. °· Electrocardiography. °· Chest X-ray. °· Blood tests. °· Exercise stress test. °· Cardiac angiography. °· Radionuclide scans. °TREATMENT  °Treatment is aimed at managing the symptoms of heart failure. Medicines, behavioral changes, or surgical intervention may be necessary to  treat heart failure. °· Medicines to help treat heart failure may include: °¨ Angiotensin-converting enzyme (ACE) inhibitors. This type of medicine blocks the effects of a blood protein called angiotensin-converting enzyme. ACE inhibitors relax (dilate) the blood vessels and help lower blood pressure. °¨ Angiotensin receptor blockers (ARBs). This type of medicine blocks the actions of a blood protein called angiotensin. Angiotensin receptor blockers dilate the blood vessels and help lower blood pressure. °¨ Water pills (diuretics). Diuretics cause the kidneys to remove salt and water from the blood. The extra fluid is removed through urination. This loss of extra fluid lowers the volume of blood the heart pumps. °¨ Beta blockers. These prevent the heart from beating too fast and improve heart muscle strength. °¨ Digitalis. This increases the force of the heartbeat. °· Healthy behavior changes include: °¨ Obtaining and maintaining a healthy weight. °¨ Stopping smoking or chewing tobacco. °¨ Eating heart-healthy foods. °¨ Limiting or avoiding alcohol. °¨ Stopping illicit drug use. °¨ Physical activity as directed by your health care provider. °· Surgical treatment for heart failure may include: °¨ A procedure to open blocked arteries, repair damaged heart valves, or remove damaged heart muscle tissue. °¨ A pacemaker to improve heart muscle function and control certain abnormal heart rhythms. °¨ An internal cardioverter defibrillator to treat certain serious abnormal heart rhythms. °¨ A left ventricular assist device (LVAD) to assist the pumping ability of the heart. °HOME CARE INSTRUCTIONS  °· Take medicines only as directed by your health care provider. Medicines are important in reducing the workload of your heart, slowing the progression of heart failure, and improving your symptoms. °¨ Do not stop taking your medicine unless directed by your health care provider. °¨ Do not skip any dose of medicine. °¨ Refill your  prescriptions before you run out of medicine. Your medicines are needed every day. °· Engage in moderate physical activity if directed by your health care provider. Moderate physical activity can benefit some people. The elderly and people with severe heart failure should consult with a health care provider for physical activity recommendations. °· Eat heart-healthy foods. Food choices should be free of trans fat and low in saturated fat, cholesterol, and salt (sodium). Healthy choices include fresh or frozen fruits and vegetables, fish, lean meats, legumes, fat-free or low-fat dairy products, and whole grain or high fiber foods. Talk to a dietitian to learn more about heart-healthy foods. °· Limit sodium if directed by your health care provider. Sodium restriction may reduce symptoms of heart failure in some people. Talk to a dietitian to learn more about heart-healthy seasonings. °· Use healthy cooking methods. Healthy cooking methods include roasting, grilling, broiling, baking, poaching, steaming, or stir-frying. Talk to a dietitian to learn more about healthy cooking methods. °· Limit fluids if directed by your health care provider. Fluid restriction may reduce symptoms of heart failure in some people. °· Weigh yourself every day. Daily weights are important in the early recognition of excess fluid. You should weigh yourself every morning after you urinate and before you eat breakfast. Wear the same amount of clothing each time you weigh yourself. Record your daily weight. Provide your health care provider with your weight record. °· Monitor and record your blood pressure if directed by your health care   provider.  Check your pulse if directed by your health care provider.  Lose weight if directed by your health care provider. Weight loss may reduce symptoms of heart failure in some people.  Stop smoking or chewing tobacco. Nicotine makes your heart work harder by causing your blood vessels to constrict.  Do not use nicotine gum or patches before talking to your health care provider.  Keep all follow-up visits as directed by your health care provider. This is important.  Limit alcohol intake to no more than 1 drink per day for nonpregnant women and 2 drinks per day for men. One drink equals 12 ounces of beer, 5 ounces of wine, or 1 ounces of hard liquor. Drinking more than that is harmful to your heart. Tell your health care provider if you drink alcohol several times a week. Talk with your health care provider about whether alcohol is safe for you. If your heart has already been damaged by alcohol or you have severe heart failure, drinking alcohol should be stopped completely.  Stop illicit drug use.  Stay up-to-date with immunizations. It is especially important to prevent respiratory infections through current pneumococcal and influenza immunizations.  Manage other health conditions such as hypertension, diabetes, thyroid disease, or abnormal heart rhythms as directed by your health care provider.  Learn to manage stress.  Plan rest periods when fatigued.  Learn strategies to manage high temperatures. If the weather is extremely hot:  Avoid vigorous physical activity.  Use air conditioning or fans or seek a cooler location.  Avoid caffeine and alcohol.  Wear loose-fitting, lightweight, and light-colored clothing.  Learn strategies to manage cold temperatures. If the weather is extremely cold:  Avoid vigorous physical activity.  Layer clothes.  Wear mittens or gloves, a hat, and a scarf when going outside.  Avoid alcohol.  Obtain ongoing education and support as needed.  Participate in or seek rehabilitation as needed to maintain or improve independence and quality of life. SEEK MEDICAL CARE IF:   Your weight increases by 03 lb/1.4 kg in 1 day or 05 lb/2.3 kg in a week.  You have increasing shortness of breath that is unusual for you.  You are unable to participate in  your usual physical activities.  You tire easily.  You cough more than normal, especially with physical activity.  You have any or more swelling in areas such as your hands, feet, ankles, or abdomen.  You are unable to sleep because it is hard to breathe.  You feel like your heart is beating fast (palpitations).  You become dizzy or light-headed upon standing up. SEEK IMMEDIATE MEDICAL CARE IF:   You have difficulty breathing.  There is a change in mental status such as decreased alertness or difficulty with concentration.  You have a pain or discomfort in your chest.  You have an episode of fainting (syncope). MAKE SURE YOU:   Understand these instructions.  Will watch your condition.  Will get help right away if you are not doing well or get worse. Document Released: 05/04/2005 Document Revised: 09/18/2013 Document Reviewed: 06/03/2012 [facility] Patient Information 2015 [place], [place]. This information is not intended to replace advice given to you by your health care provider. Make sure you discuss any questions you have with your health care provider. Acute Bronchitis Bronchitis is inflammation of the airways that extend from the windpipe into the lungs (bronchi). The inflammation often causes mucus to develop. This leads to a cough, which is the most common symptom of bronchitis.  In acute bronchitis, the condition usually develops suddenly and goes away over time, usually in a couple weeks. Smoking, allergies, and asthma can make bronchitis worse. Repeated episodes of bronchitis may cause further lung problems.  CAUSES Acute bronchitis is most often caused by the same virus that causes a cold. The virus can spread from person to person (contagious) through coughing, sneezing, and touching contaminated objects. SIGNS AND SYMPTOMS   Cough.   Fever.   Coughing up mucus.   Body aches.   Chest congestion.   Chills.   Shortness of breath.   Sore throat.   DIAGNOSIS  Acute bronchitis is usually diagnosed through a physical exam. Your health care provider will also ask you questions about your medical history. Tests, such as chest X-rays, are sometimes done to rule out other conditions.  TREATMENT  Acute bronchitis usually goes away in a couple weeks. Oftentimes, no medical treatment is necessary. Medicines are sometimes given for relief of fever or cough. Antibiotic medicines are usually not needed but may be prescribed in certain situations. In some cases, an inhaler may be recommended to help reduce shortness of breath and control the cough. A cool mist vaporizer may also be used to help thin bronchial secretions and make it easier to clear the chest.  HOME CARE INSTRUCTIONS  Get plenty of rest.   Drink enough fluids to keep your urine clear or pale yellow (unless you have a medical condition that requires fluid restriction). Increasing fluids may help thin your respiratory secretions (sputum) and reduce chest congestion, and it will prevent dehydration.   Take medicines only as directed by your health care provider.  If you were prescribed an antibiotic medicine, finish it all even if you start to feel better.  Avoid smoking and secondhand smoke. Exposure to cigarette smoke or irritating chemicals will make bronchitis worse. If you are a smoker, consider using nicotine gum or skin patches to help control withdrawal symptoms. Quitting smoking will help your lungs heal faster.   Reduce the chances of another bout of acute bronchitis by washing your hands frequently, avoiding people with cold symptoms, and trying not to touch your hands to your mouth, nose, or eyes.   Keep all follow-up visits as directed by your health care provider.  SEEK MEDICAL CARE IF: Your symptoms do not improve after 1 week of treatment.  SEEK IMMEDIATE MEDICAL CARE IF:  You develop an increased fever or chills.   You have chest pain.   You have severe  shortness of breath.  You have bloody sputum.   You develop dehydration.  You faint or repeatedly feel like you are going to pass out.  You develop repeated vomiting.  You develop a severe headache. MAKE SURE YOU:   Understand these instructions.  Will watch your condition.  Will get help right away if you are not doing well or get worse. Document Released: 06/11/2004 Document Revised: 09/18/2013 Document Reviewed: 10/25/2012 [facility] Patient Information 2015 [place], [place]. This information is not intended to replace advice given to you by your health care provider. Make sure you discuss any questions you have with your health care provider.

## 2015-01-08 NOTE — ED Notes (Signed)
Pt ambulated 77ft in the hall with O2 saturation of 92%.

## 2015-01-08 NOTE — ED Notes (Signed)
X-ray aware that patient is ready for transport.

## 2015-01-13 LAB — CULTURE, BLOOD (ROUTINE X 2)
Culture: NO GROWTH
Culture: NO GROWTH

## 2015-01-16 ENCOUNTER — Ambulatory Visit (INDEPENDENT_AMBULATORY_CARE_PROVIDER_SITE_OTHER): Payer: Medicare HMO | Admitting: Cardiology

## 2015-01-16 ENCOUNTER — Ambulatory Visit: Payer: Medicare HMO | Admitting: Cardiology

## 2015-01-16 ENCOUNTER — Encounter: Payer: Self-pay | Admitting: Cardiology

## 2015-01-16 VITALS — BP 132/58 | HR 91 | Ht 62.0 in

## 2015-01-16 DIAGNOSIS — Q2111 Secundum atrial septal defect: Secondary | ICD-10-CM

## 2015-01-16 DIAGNOSIS — I482 Chronic atrial fibrillation, unspecified: Secondary | ICD-10-CM

## 2015-01-16 DIAGNOSIS — I5032 Chronic diastolic (congestive) heart failure: Secondary | ICD-10-CM | POA: Diagnosis not present

## 2015-01-16 DIAGNOSIS — I10 Essential (primary) hypertension: Secondary | ICD-10-CM | POA: Diagnosis not present

## 2015-01-16 DIAGNOSIS — Q211 Atrial septal defect: Secondary | ICD-10-CM | POA: Diagnosis not present

## 2015-01-16 NOTE — Assessment & Plan Note (Signed)
Continue Cardizem and digoxin for rate control. Continue Coumadin.

## 2015-01-16 NOTE — Assessment & Plan Note (Signed)
Blood pressure controlled. Continue present medications. 

## 2015-01-16 NOTE — Assessment & Plan Note (Signed)
Conservative measures given age. 

## 2015-01-16 NOTE — Patient Instructions (Signed)
Your physician wants you to follow-up in: 6 MONTHS WITH DR CRENSHAW You will receive a reminder letter in the mail two months in advance. If you don't receive a letter, please call our office to schedule the follow-up appointment.  

## 2015-01-16 NOTE — Assessment & Plan Note (Signed)
Patient with recently diagnosed pneumonia. Her chest x-ray recently showed small effusions and BNP minimally elevated. However she is not volume overloaded on examination. Her history is most consistent with pneumonia/bronchitis as she continues to have a productive cough but this apparently is improving. I have asked her to follow-up with primary care physician this issue. If it worsens she may need a second course of antibiotics. We will continue with her present dose of Lasix. I have asked her to take an additional 20 mg as needed for edema or worsening dyspnea.

## 2015-01-16 NOTE — Progress Notes (Signed)
HPI: FU atrial fibrillation. Syncope 2009. Myovue on September 15, 2007 showed no ischemia or infarction, and her ejection fraction was 78%. She also had a CardioNet monitor. There was atrial fibrillation noted which was chronic, but there were no significant pauses. Echocardiogram 9/15 showed normal LV function; restrictive filling, mild biatrial enlargement, mild MR, secundum ASD. Patient recently traveled to [place] and [place]. She developed pneumonia and was hospitalized for 3 days by reported. Records are not available. She was treated with Levaquin. She was seen in the emergency room on August 23 and chest x-ray showed small bilateral pleural effusions. BNP 281. Patient states she does not have dyspnea. No chest pain, palpitations or syncope. No pedal edema. She continues to have a productive cough of yellow sputum.   Current Outpatient Prescriptions  Medication Sig Dispense Refill  . Bioflavonoid Products (VITAMIN C) CHEW Chew 2 tablets by mouth daily.    . cetirizine (ZYRTEC) 10 MG tablet Take 10 mg by mouth daily.    . digoxin (LANOXIN) 0.125 MG tablet Take 125 mcg by mouth daily.  0  . diltiazem (DILACOR XR) 120 MG 24 hr capsule Take 1 capsule (120 mg total) by mouth daily. 30 capsule 0  . Fluticasone Furoate-Vilanterol (BREO ELLIPTA) 200-25 MCG/INH AEPB Inhale 1 Inhaler into the lungs daily as needed (Shortness of Breath).     . furosemide (LASIX) 20 MG tablet Take 1 tablet (20 mg total) by mouth daily. 30 tablet 0  . levofloxacin (LEVAQUIN) 750 MG tablet Take 750 mg by mouth every other day. Every other day till finished    . Multiple Vitamin (MULTIVITAMIN) tablet Take 2 tablets by mouth daily.     . polyethylene glycol (MIRALAX) packet Take 17 g by mouth daily. 14 each 0  . warfarin (COUMADIN) 5 MG tablet Take 2.5 mg by mouth See admin instructions. Patient is taking 3 times weekly. Monday Wednesday Friday,     No current facility-administered medications for this visit.      Past Medical History  Diagnosis Date  . Hypertension   . Atrial fibrillation   . Syncope   . Drug therapy     coumadin  . Bronchiectasis     History of  . Pneumonia "lots of times"  . Arthritis     "back" (02/12/2014)  . On home oxygen therapy     "prn for the last week" (02/12/2014)    Past Surgical History  Procedure Laterality Date  . Knee arthrocentesis Left 07/2007    and injection  . Tonsillectomy    . Cesarean section  1958  . Femur fracture surgery Right     Following a trauma  . Cataract extraction, bilateral Bilateral   . Fracture surgery    . Hip fracture surgery Right     pt denies this hx on 02/12/2014 "It was my leg, not my hip"    Social History   Social History  . Marital Status: Married    Spouse Name: N/A  . Number of Children: N/A  . Years of Education: N/A   Occupational History  . [employer] of [employer]    Social History Main Topics  . Smoking status: Never Smoker   . Smokeless tobacco: Never Used  . Alcohol Use: No  . Drug Use: No  . Sexual Activity: Not on file   Other Topics Concern  . Not on file   Social History Narrative   Married    ROS: recent productive cough  but no hemoptysis, dysphasia, odynophagia, melena, hematochezia, dysuria, hematuria, rash, seizure activity, orthopnea, PND, pedal edema, claudication. Remaining systems are negative.  Physical Exam: Well-developed well-nourished in no acute distress.  Skin is warm and dry.  HEENT is normal.  Neck is supple.  Chest diminished breath sounds throughout and mild expiratory wheeze Cardiovascular exam is irregular Abdominal exam nontender or distended. No masses palpated. Extremities show no edema. neuro grossly intact

## 2015-09-04 ENCOUNTER — Observation Stay (HOSPITAL_COMMUNITY)
Admission: EM | Admit: 2015-09-04 | Discharge: 2015-09-06 | Disposition: A | Discharge status: Home / Self Care | Payer: Medicare HMO | Attending: Internal Medicine | Admitting: Internal Medicine

## 2015-09-04 ENCOUNTER — Encounter (HOSPITAL_COMMUNITY): Payer: Self-pay | Admitting: Emergency Medicine

## 2015-09-04 ENCOUNTER — Emergency Department (HOSPITAL_COMMUNITY): Payer: Medicare HMO

## 2015-09-04 DIAGNOSIS — N183 Chronic kidney disease, stage 3 unspecified: Secondary | ICD-10-CM | POA: Diagnosis present

## 2015-09-04 DIAGNOSIS — E43 Unspecified severe protein-calorie malnutrition: Secondary | ICD-10-CM | POA: Diagnosis not present

## 2015-09-04 DIAGNOSIS — R197 Diarrhea, unspecified: Secondary | ICD-10-CM | POA: Diagnosis not present

## 2015-09-04 DIAGNOSIS — Z79899 Other long term (current) drug therapy: Secondary | ICD-10-CM | POA: Insufficient documentation

## 2015-09-04 DIAGNOSIS — I130 Hypertensive heart and chronic kidney disease with heart failure and stage 1 through stage 4 chronic kidney disease, or unspecified chronic kidney disease: Secondary | ICD-10-CM | POA: Diagnosis not present

## 2015-09-04 DIAGNOSIS — R791 Abnormal coagulation profile: Secondary | ICD-10-CM

## 2015-09-04 DIAGNOSIS — R042 Hemoptysis: Secondary | ICD-10-CM | POA: Diagnosis not present

## 2015-09-04 DIAGNOSIS — Z7901 Long term (current) use of anticoagulants: Secondary | ICD-10-CM | POA: Diagnosis not present

## 2015-09-04 DIAGNOSIS — M109 Gout, unspecified: Secondary | ICD-10-CM | POA: Diagnosis not present

## 2015-09-04 DIAGNOSIS — I4891 Unspecified atrial fibrillation: Secondary | ICD-10-CM | POA: Diagnosis not present

## 2015-09-04 DIAGNOSIS — D72829 Elevated white blood cell count, unspecified: Secondary | ICD-10-CM | POA: Insufficient documentation

## 2015-09-04 DIAGNOSIS — J449 Chronic obstructive pulmonary disease, unspecified: Secondary | ICD-10-CM | POA: Insufficient documentation

## 2015-09-04 DIAGNOSIS — J9611 Chronic respiratory failure with hypoxia: Secondary | ICD-10-CM | POA: Diagnosis not present

## 2015-09-04 DIAGNOSIS — R531 Weakness: Secondary | ICD-10-CM

## 2015-09-04 DIAGNOSIS — I10 Essential (primary) hypertension: Secondary | ICD-10-CM | POA: Diagnosis present

## 2015-09-04 DIAGNOSIS — Z9981 Dependence on supplemental oxygen: Secondary | ICD-10-CM | POA: Diagnosis not present

## 2015-09-04 DIAGNOSIS — J849 Interstitial pulmonary disease, unspecified: Secondary | ICD-10-CM | POA: Insufficient documentation

## 2015-09-04 DIAGNOSIS — I5033 Acute on chronic diastolic (congestive) heart failure: Secondary | ICD-10-CM | POA: Diagnosis not present

## 2015-09-04 DIAGNOSIS — I482 Chronic atrial fibrillation: Secondary | ICD-10-CM | POA: Diagnosis not present

## 2015-09-04 DIAGNOSIS — I5032 Chronic diastolic (congestive) heart failure: Secondary | ICD-10-CM

## 2015-09-04 DIAGNOSIS — I509 Heart failure, unspecified: Secondary | ICD-10-CM

## 2015-09-04 HISTORY — DX: Chronic obstructive pulmonary disease, unspecified: J44.9

## 2015-09-04 LAB — CBC WITH DIFFERENTIAL/PLATELET
BASOS ABS: 0 10*3/uL (ref 0.0–0.1)
Basophils Relative: 0 %
Eosinophils Absolute: 0 10*3/uL (ref 0.0–0.7)
Eosinophils Relative: 0 %
HEMATOCRIT: 43.9 % (ref 36.0–46.0)
Hemoglobin: 15 g/dL (ref 12.0–15.0)
LYMPHS PCT: 7 %
Lymphs Abs: 1 10*3/uL (ref 0.7–4.0)
MCH: 32.1 pg (ref 26.0–34.0)
MCHC: 34.2 g/dL (ref 30.0–36.0)
MCV: 93.8 fL (ref 78.0–100.0)
MONO ABS: 2.4 10*3/uL — AB (ref 0.1–1.0)
MONOS PCT: 18 %
NEUTROS ABS: 10.3 10*3/uL — AB (ref 1.7–7.7)
Neutrophils Relative %: 75 %
Platelets: 185 10*3/uL (ref 150–400)
RBC: 4.68 MIL/uL (ref 3.87–5.11)
RDW: 17.3 % — AB (ref 11.5–15.5)
WBC: 13.7 10*3/uL — ABNORMAL HIGH (ref 4.0–10.5)

## 2015-09-04 LAB — COMPREHENSIVE METABOLIC PANEL
ALBUMIN: 3.4 g/dL — AB (ref 3.5–5.0)
ALT: 30 U/L (ref 14–54)
ANION GAP: 8 (ref 5–15)
AST: 28 U/L (ref 15–41)
Alkaline Phosphatase: 55 U/L (ref 38–126)
BUN: 30 mg/dL — ABNORMAL HIGH (ref 6–20)
CO2: 34 mmol/L — AB (ref 22–32)
Calcium: 8.6 mg/dL — ABNORMAL LOW (ref 8.9–10.3)
Chloride: 101 mmol/L (ref 101–111)
Creatinine, Ser: 1.08 mg/dL — ABNORMAL HIGH (ref 0.44–1.00)
GFR calc Af Amer: 50 mL/min — ABNORMAL LOW (ref >60)
GFR calc non Af Amer: 43 mL/min — ABNORMAL LOW (ref >60)
GLUCOSE: 97 mg/dL (ref 65–99)
POTASSIUM: 4 mmol/L (ref 3.5–5.1)
SODIUM: 143 mmol/L (ref 135–145)
TOTAL PROTEIN: 7.5 g/dL (ref 6.5–8.1)
Total Bilirubin: 1 mg/dL (ref 0.3–1.2)

## 2015-09-04 LAB — PROTIME-INR
INR: >10 (ref 0.00–1.49)
PROTHROMBIN TIME: 88.6 s — AB (ref 11.6–15.2)

## 2015-09-04 LAB — BRAIN NATRIURETIC PEPTIDE: B Natriuretic Peptide: 225.4 pg/mL — ABNORMAL HIGH (ref 0.0–100.0)

## 2015-09-04 LAB — TROPONIN I: Troponin I: 0.03 ng/mL (ref <0.031)

## 2015-09-04 MED ORDER — ENSURE ENLIVE PO LIQD
237.0000 mL | Freq: Two times a day (BID) | ORAL | Status: DC
  Administered 2015-09-04 – 2015-09-06 (×3): 237 mL via ORAL

## 2015-09-04 MED ORDER — FUROSEMIDE 10 MG/ML IJ SOLN
40.0000 mg | Freq: Once | INTRAMUSCULAR | Status: AC
  Administered 2015-09-04: 40 mg via INTRAVENOUS
  Filled 2015-09-04: qty 4

## 2015-09-04 MED ORDER — SODIUM CHLORIDE 0.9% FLUSH
3.0000 mL | Freq: Two times a day (BID) | INTRAVENOUS | Status: DC
  Administered 2015-09-04 – 2015-09-06 (×5): 3 mL via INTRAVENOUS

## 2015-09-04 MED ORDER — ADULT MULTIVITAMIN W/MINERALS CH
1.0000 | ORAL_TABLET | Freq: Every day | ORAL | Status: DC
  Administered 2015-09-04 – 2015-09-06 (×3): 1 via ORAL
  Filled 2015-09-04 (×3): qty 1

## 2015-09-04 MED ORDER — DILTIAZEM HCL ER 120 MG PO CP24
120.0000 mg | ORAL_CAPSULE | Freq: Every day | ORAL | Status: DC
  Administered 2015-09-04 – 2015-09-06 (×3): 120 mg via ORAL
  Filled 2015-09-04 (×5): qty 1

## 2015-09-04 MED ORDER — IPRATROPIUM-ALBUTEROL 0.5-2.5 (3) MG/3ML IN SOLN
3.0000 mL | RESPIRATORY_TRACT | Status: DC
  Administered 2015-09-04: 3 mL via RESPIRATORY_TRACT
  Filled 2015-09-04: qty 3

## 2015-09-04 MED ORDER — IPRATROPIUM-ALBUTEROL 0.5-2.5 (3) MG/3ML IN SOLN
3.0000 mL | Freq: Three times a day (TID) | RESPIRATORY_TRACT | Status: DC
  Administered 2015-09-04 – 2015-09-06 (×7): 3 mL via RESPIRATORY_TRACT
  Filled 2015-09-04 (×7): qty 3

## 2015-09-04 MED ORDER — FUROSEMIDE 20 MG PO TABS
20.0000 mg | ORAL_TABLET | Freq: Every day | ORAL | Status: DC
  Administered 2015-09-05 – 2015-09-06 (×2): 20 mg via ORAL
  Filled 2015-09-04 (×2): qty 1

## 2015-09-04 MED ORDER — PHYTONADIONE 5 MG PO TABS
2.5000 mg | ORAL_TABLET | Freq: Once | ORAL | Status: AC
  Administered 2015-09-04: 2.5 mg via ORAL
  Filled 2015-09-04: qty 1

## 2015-09-04 MED ORDER — FLUTICASONE PROPIONATE 50 MCG/ACT NA SUSP
1.0000 | Freq: Every day | NASAL | Status: DC | PRN
  Filled 2015-09-04: qty 16

## 2015-09-04 MED ORDER — LORATADINE 10 MG PO TABS
10.0000 mg | ORAL_TABLET | Freq: Every day | ORAL | Status: DC
  Administered 2015-09-04 – 2015-09-06 (×3): 10 mg via ORAL
  Filled 2015-09-04 (×3): qty 1

## 2015-09-04 MED ORDER — FLUTICASONE FUROATE-VILANTEROL 200-25 MCG/INH IN AEPB: 1.0000 | INHALATION_SPRAY | Freq: Every day | RESPIRATORY_TRACT | Status: DC | PRN

## 2015-09-04 MED ORDER — DIGOXIN 125 MCG PO TABS
125.0000 ug | ORAL_TABLET | Freq: Every day | ORAL | Status: DC
  Administered 2015-09-04 – 2015-09-06 (×3): 125 ug via ORAL
  Filled 2015-09-04 (×3): qty 1

## 2015-09-04 NOTE — H&P (Signed)
History and Physical    [facility] ZOX:[id number] DOB: 12-Jun-1920 DOA: 09/04/2015  Referring MD/NP/PA: Marily Memos, MD PCP: Pamelia Hoit, MD  Patient coming from: Home  Chief Complaint: blood tinged sputum  HPI: Candice Rangel is a 82 y.o. female with medical history significant of COPD, Afib, CHF, HTN who presented with a few hour history of coughing with yellow sputum which changed to blood tinged sputum.  She reports that she was in her normal state of health until 2 am on the morning of admission when she developed increased sputum and need to clear her throat.  She actually denies coughing, but her description sounds like a cough.  She initially was only bringing up yellow phlegm, but this changed to blood tinged and then frank bloody (teaspoon amount).  When her daughter saw that, she brought her to the ED.  Candice Rangel has a history of COPD and was on home O2 therapy.  She was found to have O2 saturation in the low 90s here and decreased breath sounds at the bases.  Apparently, a few days ago she had an appointment with her PCP who heard wheezing on exam and ordered her prednisone for 5 days.  She has 2 pills left.  There has also been a miscommunication at her pharmacy in that she was supposed to be on 5mg  warfarin tablets 5 days a week, however, they prescribed her 2mg  tablets.  This was recently discovered and she was given back the 5mg  tablets.  Today, however, her INR is supratherapeutic at > 10.  She has no other symptoms, denies fever, chills, chest pain, nausea, vomiting.  She does have chronic diarrhea for which she is prescribed loperamide.  She has recently successfully gained weight.   ED Course: O2 saturation in the low 90s (likely chronic for this lady), reported to have crackles at mid lung, however, she had just received a breathing treatment and her lung sounds were actually better.  She had decreased breath sounds at the bases.    Review of Systems: As per HPI  otherwise 10 point review of systems negative.  Chronic diarrhea.  Malnutrition.    Past Medical History  Diagnosis Date  . Hypertension   . Atrial fibrillation (HCC)   . Syncope   . Drug therapy     coumadin  . Bronchiectasis     History of  . Pneumonia "lots of times"  . Arthritis     "back" (02/12/2014)  . On home oxygen therapy     "prn for the last week" (02/12/2014)  . COPD (chronic obstructive pulmonary disease) [facility])     Past Surgical History  Procedure Laterality Date  . Knee arthrocentesis Left 07/2007    and injection  . Tonsillectomy    . Cesarean section  1958  . Femur fracture surgery Right     Following a trauma  . Cataract extraction, bilateral Bilateral   . Fracture surgery    . Hip fracture surgery Right     pt denies this hx on 02/12/2014 "It was my leg, not my hip"    Reports that she has never smoked. She has never used smokeless tobacco. She reports that she does not drink alcohol or use illicit drugs.  Allergies  Allergen Reactions  . Cephalexin Other (See Comments)    "Causes an infection"  . Other     Pain medicine , unsure of which one, used for gout, made her feel loopy  . Penicillins Swelling    Has  patient had a PCN reaction causing immediate rash, facial/tongue/throat swelling, SOB or lightheadedness with hypotension: Yes Has patient had a PCN reaction causing severe rash involving mucus membranes or skin necrosis: No Has patient had a PCN reaction that required hospitalization Yes Has patient had a PCN reaction occurring within the last 10 years: No If all of the above answers are "NO", then may proceed with Cephalosporin use.     Family History  Problem Relation Age of Onset  . Heart attack Mother 52  . Pneumonia Father      Prior to Admission medications   Medication Sig Start Date End Date Taking? Authorizing Provider  albuterol (VENTOLIN HFA) 108 (90 Base) MCG/ACT inhaler Inhale 1-2 puffs into the lungs 4 (four) times daily as  needed for wheezing or shortness of breath.  08/24/15  Yes Historical Provider, MD  cetirizine (ZYRTEC) 10 MG tablet Take 10 mg by mouth daily.   Yes Historical Provider, MD  digoxin (LANOXIN) 0.125 MG tablet Take 125 mcg by mouth daily. 10/31/14  Yes Historical Provider, MD  diltiazem (DILACOR XR) 120 MG 24 hr capsule Take 1 capsule (120 mg total) by mouth daily. 02/08/14  Yes Renae Fickle, MD  fluticasone (FLONASE) 50 MCG/ACT nasal spray Place 1-2 sprays into both nostrils daily as needed for allergies.  05/30/15  Yes Historical Provider, MD  Fluticasone Furoate-Vilanterol (BREO ELLIPTA) 200-25 MCG/INH AEPB Inhale 1 Inhaler into the lungs daily as needed (Shortness of Breath).    Yes Historical Provider, MD  furosemide (LASIX) 20 MG tablet Take 1 tablet (20 mg total) by mouth daily. 02/14/14  Yes Belkys A Regalado, MD  meloxicam (MOBIC) 7.5 MG tablet Take 7.5 mg by mouth daily as needed for pain.  08/14/15  Yes Historical Provider, MD  Multiple Vitamin (MULTIVITAMIN) tablet Take 2 tablets by mouth daily.    Yes Historical Provider, MD  predniSONE (DELTASONE) 20 MG tablet Take 20 mg by mouth as directed. Take 20 mgs twice daily for 5 days then s daily for 5 days 08/26/15  Yes Historical Provider, MD  warfarin (COUMADIN) 5 MG tablet Take 5 mg by mouth See admin instructions. Take s daily on Sun,Tues,Wed,Thurs,Sat   Yes Historical Provider, MD    Physical Exam: Filed Vitals:   09/04/15 1311 09/04/15 1326 09/04/15 1330 09/04/15 1439  BP:  148/84 163/90 155/52  Pulse: 97 88 89 94  Temp:    97.9 F (36.6 C)  TempSrc:    Oral  Resp: Height:      Weight:      SpO2: 92% 92% 92% 96%      Constitutional: Very thin elderly woman, in some distress due to needing to get out bed and use bathroom.   Filed Vitals:   09/04/15 1311 09/04/15 1326 09/04/15 1330 09/04/15 1439  BP:  148/84 163/90 155/52  Pulse: 97 88 89 94  Temp:    97.9 F (36.6 C)  TempSrc:    Oral  Resp: Height:      Weight:      SpO2: 92% 92% 92% 96%   Eyes: PERRL, lids and conjunctivae normal ENMT: Mucous membranes are mildly dry.  Ears have wax bilaterally.  Neck: normal, supple, no masses, no thyromegaly Respiratory: Decreased breath sounds at bases, no wheezing, no crackles. Normal respiratory effort. No accessory muscle use.  Cardiovascular: Irreg Irreg, normal rate, no murmurs / rubs / gallops. No extremity edema.   Abdomen: no tenderness,  no masses palpated.  Bowel sounds positive.  Musculoskeletal: no clubbing / cyanosis. Thin limbs.  Skin: no rashes, lesions, ulcers.  Neurologic:  Sensation intact,  Strength 5/5 in all 4.  Psychiatric: Alert and oriented x 3. Normal mood.   Labs on Admission: I have personally reviewed following labs and imaging studies  CBC:  Recent Labs Lab 09/04/15 1129  WBC 13.7*  NEUTROABS 10.3*  HGB 15.0  HCT 43.9  MCV 93.8  PLT 185   Basic Metabolic Panel:  Recent Labs Lab 09/04/15 1129  NA 143  K 4.0  CL 101  CO2 34*  GLUCOSE 97  BUN 30*  CREATININE 1.08*  CALCIUM 8.6*   GFR: Estimated Creatinine Clearance: 25.7 mL/min (by C-G formula based on Cr of 1.08). Liver Function Tests:  Recent Labs Lab 09/04/15 1129  AST 28  ALT 30  ALKPHOS 55  BILITOT 1.0  PROT 7.5  ALBUMIN 3.4*   Coagulation Profile:  Recent Labs Lab 09/04/15 1129  INR >10.00*   Cardiac Enzymes:  Recent Labs Lab 09/04/15 1129  TROPONINI 0.03   Urine analysis:    Component Value Date/Time   COLORURINE YELLOW 01/08/2015 1049   APPEARANCEUR CLEAR 01/08/2015 1049   LABSPEC 1.012 01/08/2015 1049   PHURINE 6.5 01/08/2015 1049   GLUCOSEU NEGATIVE 01/08/2015 1049   HGBUR SMALL* 01/08/2015 1049   BILIRUBINUR NEGATIVE 01/08/2015 1049   KETONESUR NEGATIVE 01/08/2015 1049   PROTEINUR 30* 01/08/2015 1049   UROBILINOGEN 0.2 01/08/2015 1049   NITRITE NEGATIVE 01/08/2015 1049   LEUKOCYTESUR NEGATIVE 01/08/2015 1049    Radiological Exams on  Admission: Dg Chest 2 View  09/04/2015  CLINICAL DATA:  Cough for past several days with wheezing EXAM: CHEST  2 VIEW COMPARISON:  January 08, 2015 FINDINGS: There are small pleural effusions bilaterally. There is bibasilar atelectasis. There is trace bibasilar edema. Heart is upper normal in size with pulmonary vascularity within normal limits. No airspace consolidation. No adenopathy. There is atherosclerotic calcification in the aorta. IMPRESSION: Suspect a degree of congestive heart failure with slight edema and small bilateral effusions. No airspace consolidation. Electronically Signed   By: Bretta Bang III M.D.   On: 09/04/2015 12:24    EKG: Independently reviewed. Afib with PVCs  Assessment/Plan  Supratherapeutic INR - Per report, there has been some confusion regarding Candice Rangel's correct dose of coumadin.  She had a therapeutic INR on what she reports to be  tablets 4 X per week, now she is on  tablets - Blood tinged sputum likely related to this and some mild CHF  - Vitamin K of 2.5 given in ED - Check INR in the AM - Since she is on coumadin for Afib, we can allow relative subtherapeutic INRs acutely as we monitor for any repeat bleeding - CBC in the AM - Coumadin per pharmacy consult when appropriate.  - Also ? If prednisone could have interacted with her coumadin  Essential hypertension - BP elevated here, she had not taken her AM meds - Restart diltiazem  Atrial fibrillation  - Restart diltiazem - Telemetry for tonight, d/c if not needed tomorrow.   CHF (congestive diastolic heart failure), NYHA class II, chronic respiratory failure - She had acute onset of increased phlegm, recent wheezing and SOB.  CXR concerning for volume overload.  She had no JVD or pedal edema, however - IV lasix X1 at  in ED - Resume home lasix - Daily weights, I/O    CKD (chronic kidney disease), stage III -  Cr at baseline - Monitor in the setting of lasix  use  Leukocytosis - Unclear source.  Clinical picture does not fit with pneumonia and CXR more consistent with volume overload - UA negative for source - Afebrile - Trend  Diet: heart healthy   DVT prophylaxis: supratherapeutic INR, restart when INR lower Code Status: Discussed with family, Full code "unless she is going to be a vegetable" Family Communication: Daughter at bedside Disposition Plan: d/c in 1-2 days, no further bleeding and improving INR Consults called: none Admission status: Observation, telemetry   Debe CoderMULLEN, Sorayah Schrodt MD [facility] Pager [phone]  If 7PM-7AM, please contact night-coverage [email or web address] Password [email or web address]  09/04/2015, 3:37 PM

## 2015-09-04 NOTE — ED Notes (Signed)
1415 pt can go up stairs.

## 2015-09-04 NOTE — ED Notes (Signed)
Patient states she has had a cough for a few days; coughing up yellow sputum. Patient began to cough up bright red blood over night. Patient given prednisone due to wheezing heard by PCP a few days ago.

## 2015-09-04 NOTE — ED Notes (Signed)
Respiratory therapist at bedside evaluating patient.

## 2015-09-04 NOTE — ED Notes (Signed)
Lab states they will run tests on previously collected blood.

## 2015-09-04 NOTE — ED Notes (Signed)
Hospitalist at bedside 

## 2015-09-04 NOTE — ED Notes (Signed)
MD at bedside. Triage delayed.

## 2015-09-04 NOTE — Care Management Obs Status (Signed)
MEDICARE OBSERVATION STATUS NOTIFICATION   Patient Details  Name: Candice Rangel MRN: [id number] Date of Birth: 11/21/1920   Medicare Observation Status Notification Given:  Yes (r/o chf )    Ophelia ShoulderGibbs, Kimberly Louise, RN 09/04/2015, 2:30 PM

## 2015-09-04 NOTE — ED Provider Notes (Signed)
CSN: [id number]     Arrival date & time 09/04/15  1055 History   First MD Initiated Contact with Patient 09/04/15 1106     Chief Complaint  Patient presents with  . Hemoptysis     (Consider location/radiation/quality/duration/timing/severity/associated sxs/prior Treatment) Patient is a 82 y.o. female presenting with cough.  Cough Cough characteristics:  Productive Sputum characteristics:  Yellow, pink and bloody Severity:  Mild Onset quality:  Gradual Timing:  Constant Chronicity:  New Ineffective treatments: steroids. Associated symptoms: sinus congestion and sore throat   Associated symptoms: no chest pain, no chills and no fever     Past Medical History  Diagnosis Date  . Hypertension   . Atrial fibrillation (HCC)   . Syncope   . Drug therapy     coumadin  . Bronchiectasis     History of  . Pneumonia "lots of times"  . Arthritis     "back" (02/12/2014)  . On home oxygen therapy     "prn for the last week" (02/12/2014)  . COPD (chronic obstructive pulmonary disease) [facility])    Past Surgical History  Procedure Laterality Date  . Knee arthrocentesis Left 07/2007    and injection  . Tonsillectomy    . Cesarean section  1958  . Femur fracture surgery Right     Following a trauma  . Cataract extraction, bilateral Bilateral   . Fracture surgery    . Hip fracture surgery Right     pt denies this hx on 02/12/2014 "It was my leg, not my hip"   Family History  Problem Relation Age of Onset  . Heart attack Mother 32  . Pneumonia Father    Social History  Substance Use Topics  . Smoking status: Never Smoker   . Smokeless tobacco: Never Used  . Alcohol Use: No   OB History    No data available     Review of Systems  Constitutional: Negative for fever and chills.  HENT: Positive for sore throat.   Respiratory: Positive for cough.   Cardiovascular: Negative for chest pain.      Allergies  Cephalexin; Other; and Penicillins  Home Medications   Prior to  Admission medications   Medication Sig Start Date End Date Taking? Authorizing Provider  cetirizine (ZYRTEC) 10 MG tablet Take 10 mg by mouth daily.    Historical Provider, MD  digoxin (LANOXIN) 0.125 MG tablet Take 125 mcg by mouth daily. 10/31/14   Historical Provider, MD  diltiazem (DILACOR XR) 120 MG 24 hr capsule Take 1 capsule (120 mg total) by mouth daily. 02/08/14   Renae Fickle, MD  Fluticasone Furoate-Vilanterol (BREO ELLIPTA) 200-25 MCG/INH AEPB Inhale 1 Inhaler into the lungs daily as needed (Shortness of Breath).     Historical Provider, MD  furosemide (LASIX) 20 MG tablet Take 1 tablet (20 mg total) by mouth daily. 02/14/14   Belkys A Regalado, MD  Multiple Vitamin (MULTIVITAMIN) tablet Take 2 tablets by mouth daily.     Historical Provider, MD  warfarin (COUMADIN) 5 MG tablet Take 2.5 mg by mouth See admin instructions. Patient is taking 3 times weekly. Monday Wednesday Friday,    Historical Provider, MD   BP 184/85 mmHg  Pulse 86  Temp(Src) 98.3 F (36.8 C) (Oral)  Resp 18  Ht  (1.626 m)  Wt 108 lb (48.988 kg)  BMI 18.53 kg/m2  SpO2 95% Physical Exam  Constitutional: She is oriented to person, place, and time. She appears well-developed and well-nourished.  HENT:  Head:  Normocephalic and atraumatic.  Neck: Normal range of motion.  Cardiovascular: Normal rate and regular rhythm.   Pulmonary/Chest: Effort normal. No stridor. No respiratory distress. She has decreased breath sounds (bases). She has wheezes (minimal). She has rales (bilaterally).  Abdominal: She exhibits no distension.  Musculoskeletal: Normal range of motion. She exhibits no edema or tenderness.  Neurological: She is alert and oriented to person, place, and time. No cranial nerve deficit. Coordination normal.  Nursing note and vitals reviewed.   ED Course  Procedures (including critical care time) Labs Review Labs Reviewed  COMPREHENSIVE METABOLIC PANEL  CBC WITH DIFFERENTIAL/PLATELET  BRAIN  NATRIURETIC PEPTIDE  TROPONIN I    Imaging Review No results found. I have personally reviewed and evaluated these images and lab results as part of my medical decision-making.   EKG Interpretation   Date/Time:  Wednesday September 04 2015 11:53:04 EDT Ventricular Rate:  82 PR Interval:    QRS Duration: 83 QT Interval:  384 QTC Calculation: 448 R Axis:   -58 Text Interpretation:  Atrial fibrillation Multiple ventricular premature  complexes Left anterior fascicular block Anterior infarct, old Confirmed  by Nissan Frazzini MD, Barbara CowerJASON [phone]([id number]) on 09/04/2015 12:42:06 PM      MDM   Final diagnoses:  Supratherapeutic INR  Weakness   COPD exacerbation with pneumonia v CHF exacerbation.   Found to have supratherapeutic INR. Along with her active blood tinged sputum, I discussed with hospitalist and will start reversal.  Also with likely acute chf exacerbation, so treated with IV Lasix, will likely neeed frther medications for the same.     Marily MemosJason Tonica Brasington, MD 09/04/15 [phone]

## 2015-09-05 DIAGNOSIS — N183 Chronic kidney disease, stage 3 (moderate): Secondary | ICD-10-CM

## 2015-09-05 DIAGNOSIS — I10 Essential (primary) hypertension: Secondary | ICD-10-CM

## 2015-09-05 DIAGNOSIS — I5031 Acute diastolic (congestive) heart failure: Secondary | ICD-10-CM

## 2015-09-05 DIAGNOSIS — M10071 Idiopathic gout, right ankle and foot: Secondary | ICD-10-CM | POA: Diagnosis not present

## 2015-09-05 DIAGNOSIS — I482 Chronic atrial fibrillation: Secondary | ICD-10-CM | POA: Diagnosis not present

## 2015-09-05 DIAGNOSIS — R791 Abnormal coagulation profile: Secondary | ICD-10-CM

## 2015-09-05 DIAGNOSIS — E43 Unspecified severe protein-calorie malnutrition: Secondary | ICD-10-CM | POA: Insufficient documentation

## 2015-09-05 LAB — CBC WITH DIFFERENTIAL/PLATELET
BASOS ABS: 0 10*3/uL (ref 0.0–0.1)
Basophils Relative: 0 %
EOS ABS: 0 10*3/uL (ref 0.0–0.7)
Eosinophils Relative: 0 %
HCT: 43.1 % (ref 36.0–46.0)
HEMOGLOBIN: 14.7 g/dL (ref 12.0–15.0)
LYMPHS ABS: 1.5 10*3/uL (ref 0.7–4.0)
Lymphocytes Relative: 11 %
MCH: 31.9 pg (ref 26.0–34.0)
MCHC: 34.1 g/dL (ref 30.0–36.0)
MCV: 93.5 fL (ref 78.0–100.0)
Monocytes Absolute: 2 10*3/uL — ABNORMAL HIGH (ref 0.1–1.0)
Monocytes Relative: 14 %
NEUTROS PCT: 75 %
Neutro Abs: 10.3 10*3/uL — ABNORMAL HIGH (ref 1.7–7.7)
Platelets: 175 10*3/uL (ref 150–400)
RBC: 4.61 MIL/uL (ref 3.87–5.11)
RDW: 17.3 % — ABNORMAL HIGH (ref 11.5–15.5)
WBC: 13.7 10*3/uL — AB (ref 4.0–10.5)

## 2015-09-05 LAB — URINALYSIS, ROUTINE W REFLEX MICROSCOPIC
BILIRUBIN URINE: NEGATIVE
Glucose, UA: NEGATIVE mg/dL
KETONES UR: NEGATIVE mg/dL
NITRITE: NEGATIVE
PH: 7 (ref 5.0–8.0)
PROTEIN: NEGATIVE mg/dL
Specific Gravity, Urine: 1.014 (ref 1.005–1.030)

## 2015-09-05 LAB — BASIC METABOLIC PANEL
Anion gap: 11 (ref 5–15)
BUN: 30 mg/dL — ABNORMAL HIGH (ref 6–20)
CALCIUM: 8.6 mg/dL — AB (ref 8.9–10.3)
CHLORIDE: 96 mmol/L — AB (ref 101–111)
CO2: 33 mmol/L — AB (ref 22–32)
CREATININE: 1.11 mg/dL — AB (ref 0.44–1.00)
GFR calc Af Amer: 48 mL/min — ABNORMAL LOW (ref >60)
GFR calc non Af Amer: 42 mL/min — ABNORMAL LOW (ref >60)
GLUCOSE: 107 mg/dL — AB (ref 65–99)
Potassium: 4.2 mmol/L (ref 3.5–5.1)
Sodium: 140 mmol/L (ref 135–145)

## 2015-09-05 LAB — URINE MICROSCOPIC-ADD ON

## 2015-09-05 LAB — PROTIME-INR
INR: 6.86 — AB (ref 0.00–1.49)
PROTHROMBIN TIME: 55.3 s — AB (ref 11.6–15.2)

## 2015-09-05 MED ORDER — WARFARIN - PHARMACIST DOSING INPATIENT: Freq: Every day | Status: DC

## 2015-09-05 MED ORDER — VITAMIN K1 10 MG/ML IJ SOLN
1.0000 mg | Freq: Once | INTRAMUSCULAR | Status: AC
  Administered 2015-09-05: 1 mg via SUBCUTANEOUS
  Filled 2015-09-05: qty 0.1

## 2015-09-05 NOTE — Progress Notes (Signed)
Initial Nutrition Assessment  DOCUMENTATION CODES:   Severe malnutrition in context of chronic illness  INTERVENTION:  - Continue Ensure Enlive po BID, each supplement provides 350 kcal and 20 grams of protein - Encourage PO intakes of meals and supplements - RD will continue to monitor for additional needs  NUTRITION DIAGNOSIS:   Inadequate oral intake related to poor appetite as evidenced by per patient/family report.  GOAL:   Patient will meet greater than or equal to 90% of their needs  MONITOR:   PO intake, Supplement acceptance, Weight trends, Labs, I & O's  REASON FOR ASSESSMENT:   Malnutrition Screening Tool  ASSESSMENT:   82 y.o. female with medical history significant of COPD, Afib, CHF, HTN who presented with a few hour history of coughing with yellow sputum which changed to blood tinged sputum. She reports that she was in her normal state of health until 2 am on the morning of admission when she developed increased sputum and need to clear her throat. She actually denies coughing, but her description sounds like a cough. She initially was only bringing up yellow phlegm, but this changed to blood tinged and then frank bloody (teaspoon amount). When her daughter saw that, she brought her to the ED. Ms. Malbrough has a history of COPD and was on home O2 therapy. She was found to have O2 saturation in the low 90s here and decreased breath sounds at the bases. Apparently, a few days ago she had an appointment with her PCP who heard wheezing on exam and ordered her prednisone for 5 days. She has 2 pills left. There has also been a miscommunication at her pharmacy in that she was supposed to be on  warfarin tablets 5 days a week, however, they prescribed her  tablets. This was recently discovered and she was given back the  tablets. Today, however, her INR is supratherapeutic at > 10.  Pt seen for MST. BMI indicates normal weight/borderline underweight. Per chart  review, pt ate 10% of breakfast this AM; she reports that she ordered a salad for lunch this afternoon. Pt was sipping Ensure at time of visit and states this is the second one she has consumed since admission. Pt enjoys supplement but was not consuming anything similar PTA. Unable to obtain much information during this visit as pt was not interested in nutrition-directed conversation although she was very polite and pleasant; provided pt with pencil to work on crossword puzzles per her request.  Physical assessment shows severe muscle and fat wasting to upper body; lower body not assessed during this visit. Unable to obtain weight hx from pt. H&P indicates that pt has successfully gained weight. Per chart review, weight has been stable overall (103-116 lbs) x6 years and most recently, she has lost 5 lbs (4.6% body weight) in the past 8 months which is not significant for time frame.  Continue Ensure and continue encouragement with PO intakes. RD will continue to monitor for additional needs and assist as warranted. Not fully meeting needs at this time. Notes indicate pt with chronic diarrhea. Medications reviewed; 20 mg oral Lasix/day. Labs reviewed; Cl: 96 mmol/L, BUN/creatinine elevated, Ca: 8.6 mg/dL, GFR: 42.   Diet Order:  Diet Heart Room service appropriate?: Yes; Fluid consistency:: Thin  Skin:  Reviewed, no issues  Last BM:  PTA  Height:   Ht Readings from Last 1 Encounters:  09/04/15  (1.626 m)    Weight:   Wt Readings from Last 1 Encounters:  09/05/15 103  lb 2.8 oz (46.8 kg)    Ideal Body Weight:  54.54 kg (kg)  BMI:  Body mass index is 17.7 kg/(m^2).  Estimated Nutritional Needs:   Kcal:  1200-1400  Protein:  50-60 grams  Fluid:  >/= 1.4 L/day  EDUCATION NEEDS:   No education needs identified at this time     Trenton GammonJessica Cait Locust, RD, LDN Inpatient Clinical Dietitian Pager # [phone] After hours/weekend pager # [phone]

## 2015-09-05 NOTE — Progress Notes (Signed)
PROGRESS NOTE    Candice Rangel  NFA:[id number] DOB: [birth year] DOA: 09/04/2015 PCP: Pamelia HoitWILSON,FRED HENRY, MD  Outpatient Specialists:    Assessment & Plan:   Active Problems:   Essential hypertension   Atrial fibrillation (HCC)   CHF (congestive heart failure), NYHA class II (HCC)   CKD (chronic kidney disease), stage III   Interstitial lung disease (HCC)   Chronic respiratory failure with hypoxia (HCC)   Supratherapeutic INR   Protein-calorie malnutrition, severe  #1 Supratherapeutic INR ?? Etiology.Patient was initially on 2.5mg  coumadin which was changed to 5mg  daily. Patient s/p IV Vit K. INR now at 6.86. No bleeding. Continue to hold coumadin Follow.  #2 acute on chronic diastolic CHF Clinically improved. Patient s/p IV Lasix yesterday, now on oral Lasix. Follow.  #3 atrial fibrillation Continue diltiazem for rate control. INR supratherapeutic. Coumadin on hold.  #4 hypertension Stable. Continue Cardizem.  #5 chronic kidney disease stage III Stable.  #6 leukocytosis Unclear etiology. Chest x-ray consistent with volume overload and not pneumonia. Check a UA with cultures and sensitivities. Patient recently on steroids. Follow.    DVT prophylaxis: Supratherapeutic INR Code Status: Full Family Communication: Updated patient and daughter at bedside. Disposition Plan: Home when medically improved and clinically stable with improvement in supratherapeutic INR.   Consultants:   None  Procedures:   Chest x-ray 09/04/2015  Antimicrobials:   None   Subjective: Patient denies any further hemoptysis. No chest pain. No shortness of breath. No bleeding. Patient states she's been embedded on on admission.  Objective: Filed Vitals:   09/05/15 0457 09/05/15 0900 09/05/15 0932 09/05/15 1348  BP: 143/80  116/51 111/59  Pulse: 69  68 69  Temp: 97.7 F (36.5 C)   98.4 F (36.9 C)  TempSrc: Oral   Oral  Resp: 18   18  Height:      Weight: 46.8 kg (103 lb 2.8 oz)      SpO2: 97% 96%  93%    Intake/Output Summary (Last 24 hours) at 09/05/15 1433 Last data filed at 09/05/15 0900  Gross per 24 hour  Intake    600 ml  Output      0 ml  Net    600 ml   Filed Weights   09/04/15 1112 09/05/15 0457  Weight: 48.988 kg (108 lb) 46.8 kg (103 lb 2.8 oz)    Examination:  General exam: Appears calm and comfortable  Respiratory system: Clear to auscultation. Respiratory effort normal. Cardiovascular system: S1 & S2 heard, RRR. No JVD, murmurs, rubs, gallops or clicks. No pedal edema. Gastrointestinal system: Abdomen is nondistended, soft and nontender. No organomegaly or masses felt. Normal bowel sounds heard. Central nervous system: Alert and oriented. No focal neurological deficits. Extremities: Symmetric 5 x 5 power. Skin: No rashes, lesions or ulcers Psychiatry: Judgement and insight appear normal. Mood & affect appropriate.     Data Reviewed: I have personally reviewed following labs and imaging studies  CBC:  Recent Labs Lab 09/04/15 1129 09/05/15 0441  WBC 13.7* 13.7*  NEUTROABS 10.3* 10.3*  HGB 15.0 14.7  HCT 43.9 43.1  MCV 93.8 93.5  PLT 185 175   Basic Metabolic Panel:  Recent Labs Lab 09/04/15 1129 09/05/15 0441  NA 143 140  K 4.0 4.2  CL 101 96*  CO2 34* 33*  GLUCOSE 97 107*  BUN 30* 30*  CREATININE 1.08* 1.11*  CALCIUM 8.6* 8.6*   GFR: Estimated Creatinine Clearance: 23.9 mL/min (by C-G formula based on Cr of 1.11). Liver Function  Tests:  Recent Labs Lab 09/04/15 1129  AST 28  ALT 30  ALKPHOS 55  BILITOT 1.0  PROT 7.5  ALBUMIN 3.4*   No results for input(s): LIPASE, AMYLASE in the last 168 hours. No results for input(s): AMMONIA in the last 168 hours. Coagulation Profile:  Recent Labs Lab 09/04/15 1129 09/05/15 0441  INR >10.00* 6.86*   Cardiac Enzymes:  Recent Labs Lab 09/04/15 1129  TROPONINI 0.03   BNP (last 3 results) No results for input(s): PROBNP in the last 8760 hours. HbA1C: No  results for input(s): HGBA1C in the last 72 hours. CBG: No results for input(s): GLUCAP in the last 168 hours. Lipid Profile: No results for input(s): CHOL, HDL, LDLCALC, TRIG, CHOLHDL, LDLDIRECT in the last 72 hours. Thyroid Function Tests: No results for input(s): TSH, T4TOTAL, FREET4, T3FREE, THYROIDAB in the last 72 hours. Anemia Panel: No results for input(s): VITAMINB12, FOLATE, FERRITIN, TIBC, IRON, RETICCTPCT in the last 72 hours. Urine analysis:    Component Value Date/Time   COLORURINE YELLOW 01/08/2015 1049   APPEARANCEUR CLEAR 01/08/2015 1049   LABSPEC 1.012 01/08/2015 1049   PHURINE 6.5 01/08/2015 1049   GLUCOSEU NEGATIVE 01/08/2015 1049   HGBUR SMALL* 01/08/2015 1049   BILIRUBINUR NEGATIVE 01/08/2015 1049   KETONESUR NEGATIVE 01/08/2015 1049   PROTEINUR 30* 01/08/2015 1049   UROBILINOGEN 0.2 01/08/2015 1049   NITRITE NEGATIVE 01/08/2015 1049   LEUKOCYTESUR NEGATIVE 01/08/2015 1049   Sepsis Labs: (procalcitonin:4,lacticidven:4)  )No results found for this or any previous visit (from the past 240 hour(s)).       Radiology Studies: Dg Chest 2 View  09/04/2015  CLINICAL DATA:  Cough for past several days with wheezing EXAM: CHEST  2 VIEW COMPARISON:  January 08, 2015 FINDINGS: There are small pleural effusions bilaterally. There is bibasilar atelectasis. There is trace bibasilar edema. Heart is upper normal in size with pulmonary vascularity within normal limits. No airspace consolidation. No adenopathy. There is atherosclerotic calcification in the aorta. IMPRESSION: Suspect a degree of congestive heart failure with slight edema and small bilateral effusions. No airspace consolidation. Electronically Signed   By: Bretta Bang III M.D.   On: 09/04/2015 12:24        Scheduled Meds: . digoxin  125 mcg Oral Daily  . diltiazem  120 mg Oral Daily  . feeding supplement (ENSURE ENLIVE)  237 mL Oral BID BM  . furosemide  20 mg Oral Daily  .  ipratropium-albuterol  3 mL Nebulization TID  . loratadine  10 mg Oral Daily  . multivitamin with minerals  1 tablet Oral Daily  . sodium chloride flush  3 mL Intravenous Q12H   Continuous Infusions:       Time spent: 35 minutes    THOMPSON,DANIEL, MD [facility] Pager [phone]  If 7PM-7AM, please contact night-coverage [email or web address] Password [facility] 09/05/2015, 2:33 PM

## 2015-09-05 NOTE — Progress Notes (Signed)
ANTICOAGULATION CONSULT NOTE - Initial Consult  Pharmacy Consult for warfarin Indication: atrial fibrillation  Allergies  Allergen Reactions  . Cephalexin Other (See Comments)    "Causes an infection"  . Other     Pain medicine , unsure of which one, used for gout, made her feel loopy  . Penicillins Swelling    Has patient had a PCN reaction causing immediate rash, facial/tongue/throat swelling, SOB or lightheadedness with hypotension: Yes Has patient had a PCN reaction causing severe rash involving mucus membranes or skin necrosis: No Has patient had a PCN reaction that required hospitalization Yes Has patient had a PCN reaction occurring within the last 10 years: No If all of the above answers are "NO", then may proceed with Cephalosporin use.     Patient Measurements: Height: 5\' 4"  (162.6 cm) Weight: 103 lb 2.8 oz (46.8 kg) IBW/kg (Calculated) : 54.7  Vital Signs: Temp: 98.4 F (36.9 C) (04/20 1348) Temp Source: Oral (04/20 1348) BP: 115/47 mmHg (04/20 1535) Pulse Rate: 74 (04/20 1535)  Labs:  Recent Labs  09/04/15 1129 09/05/15 0441  HGB 15.0 14.7  HCT 43.9 43.1  PLT 185 175  LABPROT 88.6* 55.3*  INR >10.00* 6.86*  CREATININE 1.08* 1.11*  TROPONINI 0.03  --     Estimated Creatinine Clearance: 23.9 mL/min (by C-G formula based on Cr of 1.11).   Medical History: Past Medical History  Diagnosis Date  . Hypertension   . Atrial fibrillation (HCC)   . Syncope   . Drug therapy     coumadin  . Bronchiectasis     History of  . Pneumonia "lots of times"  . Arthritis     "back" (02/12/2014)  . On home oxygen therapy     "prn for the last week" (02/12/2014)  . COPD (chronic obstructive pulmonary disease) (HCC)      Assessment: 82 y.o. female with medical history significant of COPD, Afib, CHF, HTN who presented with a  history of coughing with yellow sputum which changed to blood tinged sputum. Home dose of warfarin 5mg  on Sun, Tues, wed, thurs, sat.  Last  dose 4/18.  Pt INR initally >10, received 2.5mg  po Vit K on 4/19 and 1mg  SQ on 4/20.    Goal of Therapy:  INR 2-3   Plan:  Hold warfarin tonight for INR 6.86 Daily INR/PT  Arley Phenixllen Page Lancon RPh 09/05/2015, 8:16 PM Pager [phone]

## 2015-09-05 NOTE — Care Management Note (Signed)
Case Management Note  Patient Details  Name: Raynelle DickYolanda Corning MRN: [id number] Date of Birth: 29-Oct-1920  Subjective/Objective:     Pt admitted with Supratherapeutic INR               Action/Plan:  Plan to dc home with HHRN/[facility].   Expected Discharge Date:                  Expected Discharge Plan:  Home w Home Health Services  In-House Referral:     Discharge planning Services  CM Consult  Post Acute Care Choice:    Choice offered to:  Patient, Adult Children  DME Arranged:    DME Agency:  [facility]  HH Arranged:    HH Agency:  [facility]  Status of Service:  In process, will continue to follow  Medicare Important Message Given:    Date Medicare IM Given:    Medicare IM give by:    Date Additional Medicare IM Given:    Additional Medicare Important Message give by:     If discussed at Long Length of Stay Meetings, dates discussed:    Additional CommentsGeni Bers:  Yenesis Even, RN 09/05/2015, 3:30 PM

## 2015-09-05 NOTE — Progress Notes (Signed)
CRITICAL VALUE ALERT  Critical value received:  INR 6.86  Date of notification:  09/05/2015  Time of notification:  0558  Critical value read back:Yes.    Nurse who received alert:  Driscilla MoatsBriana Mckenna Boruff, RN  MD notified (1st page):  Donnamarie PoagK. Kirby, NP  Time of first page:  0608  MD notified (2nd page):  Time of second page:   Responding MD:  Janyth ContesK. Kirby,NP  Time MD responded:  [phone]

## 2015-09-05 NOTE — Progress Notes (Signed)
Patient had 8 beats of V-tach, patient denies any chest pain/distress. BP 115/47, HE-74, oxygen sat 93-RA, Dr. Janee Mornhompson notified, waiting on a call back, will continue to monitor patient.

## 2015-09-06 DIAGNOSIS — I482 Chronic atrial fibrillation: Secondary | ICD-10-CM | POA: Diagnosis not present

## 2015-09-06 DIAGNOSIS — M10071 Idiopathic gout, right ankle and foot: Secondary | ICD-10-CM

## 2015-09-06 DIAGNOSIS — I10 Essential (primary) hypertension: Secondary | ICD-10-CM | POA: Diagnosis not present

## 2015-09-06 DIAGNOSIS — M109 Gout, unspecified: Secondary | ICD-10-CM | POA: Clinically undetermined

## 2015-09-06 DIAGNOSIS — E43 Unspecified severe protein-calorie malnutrition: Secondary | ICD-10-CM

## 2015-09-06 DIAGNOSIS — R791 Abnormal coagulation profile: Secondary | ICD-10-CM | POA: Diagnosis not present

## 2015-09-06 LAB — BASIC METABOLIC PANEL
ANION GAP: 8 (ref 5–15)
BUN: 33 mg/dL — ABNORMAL HIGH (ref 6–20)
CALCIUM: 8.7 mg/dL — AB (ref 8.9–10.3)
CO2: 33 mmol/L — ABNORMAL HIGH (ref 22–32)
CREATININE: 1.2 mg/dL — AB (ref 0.44–1.00)
Chloride: 99 mmol/L — ABNORMAL LOW (ref 101–111)
GFR, EST AFRICAN AMERICAN: 44 mL/min — AB (ref >60)
GFR, EST NON AFRICAN AMERICAN: 38 mL/min — AB (ref >60)
Glucose, Bld: 128 mg/dL — ABNORMAL HIGH (ref 65–99)
Potassium: 4.5 mmol/L (ref 3.5–5.1)
SODIUM: 140 mmol/L (ref 135–145)

## 2015-09-06 LAB — CBC
HCT: 41 % (ref 36.0–46.0)
Hemoglobin: 13.8 g/dL (ref 12.0–15.0)
MCH: 31.7 pg (ref 26.0–34.0)
MCHC: 33.7 g/dL (ref 30.0–36.0)
MCV: 94.3 fL (ref 78.0–100.0)
PLATELETS: 170 10*3/uL (ref 150–400)
RBC: 4.35 MIL/uL (ref 3.87–5.11)
RDW: 17.1 % — ABNORMAL HIGH (ref 11.5–15.5)
WBC: 13.3 10*3/uL — ABNORMAL HIGH (ref 4.0–10.5)

## 2015-09-06 LAB — PROTIME-INR
INR: 3.71 — ABNORMAL HIGH (ref 0.00–1.49)
Prothrombin Time: 34.8 seconds — ABNORMAL HIGH (ref 11.6–15.2)

## 2015-09-06 LAB — MAGNESIUM: MAGNESIUM: 1.9 mg/dL (ref 1.7–2.4)

## 2015-09-06 MED ORDER — METHYLPREDNISOLONE SODIUM SUCC 125 MG IJ SOLR
60.0000 mg | Freq: Once | INTRAMUSCULAR | Status: AC
  Administered 2015-09-06: 60 mg via INTRAVENOUS
  Filled 2015-09-06: qty 2

## 2015-09-06 MED ORDER — WARFARIN SODIUM 2.5 MG PO TABS: 2.5000 mg | ORAL_TABLET | ORAL | Status: DC

## 2015-09-06 MED ORDER — ENSURE ENLIVE PO LIQD: 237.0000 mL | Freq: Two times a day (BID) | ORAL | Status: AC

## 2015-09-06 MED ORDER — PREDNISONE 20 MG PO TABS: 40.0000 mg | ORAL_TABLET | Freq: Every day | ORAL | Status: AC

## 2015-09-06 NOTE — Progress Notes (Addendum)
ANTICOAGULATION CONSULT NOTE   Pharmacy Consult for warfarin Indication: atrial fibrillation  Allergies  Allergen Reactions  . Cephalexin Other (See Comments)    "Causes an infection"  . Other     Pain medicine , unsure of which one, used for gout, made her feel loopy  . Penicillins Swelling    Has patient had a PCN reaction causing immediate rash, facial/tongue/throat swelling, SOB or lightheadedness with hypotension: Yes Has patient had a PCN reaction causing severe rash involving mucus membranes or skin necrosis: No Has patient had a PCN reaction that required hospitalization Yes Has patient had a PCN reaction occurring within the last 10 years: No If all of the above answers are "NO", then may proceed with Cephalosporin use.     Patient Measurements: Height: 5\' 4"  (162.6 cm) Weight: 100 lb 1.4 oz (45.4 kg) IBW/kg (Calculated) : 54.7  Vital Signs: Temp: 97.8 F (36.6 C) (04/21 0611) Temp Source: Oral (04/21 0611) BP: 112/52 mmHg (04/21 1028) Pulse Rate: 92 (04/21 1032)  Labs:  Recent Labs  09/04/15 1129 09/05/15 0441 09/06/15 0450  HGB 15.0 14.7 13.8  HCT 43.9 43.1 41.0  PLT 185 175 170  LABPROT 88.6* 55.3* 34.8*  INR >10.00* 6.86* 3.71*  CREATININE 1.08* 1.11* 1.20*  TROPONINI 0.03  --   --     Estimated Creatinine Clearance: 21.4 mL/min (by C-G formula based on Cr of 1.2).   Medical History: Past Medical History  Diagnosis Date  . Hypertension   . Atrial fibrillation (HCC)   . Syncope   . Drug therapy     coumadin  . Bronchiectasis     History of  . Pneumonia "lots of times"  . Arthritis     "back" (02/12/2014)  . On home oxygen therapy     "prn for the last week" (02/12/2014)  . COPD (chronic obstructive pulmonary disease) (HCC)      Assessment: 82 y.o. female with medical history significant of COPD, Afib, CHF, HTN who presented with a  history of coughing with yellow sputum which changed to blood tinged sputum.  Pt INR initally >10,  received 2.5mg  po Vit K on 4/19 and 1mg  SQ on 4/20.   Home dose of warfarin 5mg  on Sun, Tues, wed, thurs, sat (No warfarin on Mon or Fri).  Last dose 4/18  09/06/2015  INR remains supratherapeutic this am   CBC: hgb and pltc WNL  Heart healthy diet ordered  No major drug-drug interactions  Goal of Therapy:  INR 2-3   Plan:   Hold warfarin tonight for INR > 3  Daily INR/PT  At d/c suggest reduce warfarin dose to 2.5mg  on Sun, Tue, Wed, Thur, Sat.  Close INR monitoring, recheck Monday if discharged this weekend  Juliette Alcideustin Zeigler, PharmD, BCPS.   Pager: [phone] 09/06/2015 10:52 AM

## 2015-09-06 NOTE — Evaluation (Signed)
Physical Therapy Evaluation Patient Details Name: Candice Rangel MRN: [id number] DOB: Oct 01, 1920 Today's Date: 09/06/2015   History of Present Illness  Candice Rangel is a 82 y.o. female with medical history significant of COPD, Afib, CHF, HTN who presented with a few hour history of coughing with yellow sputum which changed to blood tinged sputum.Found to have supratherapeutic INR.   Clinical Impression  Pt admitted with above diagnosis. Pt currently with functional limitations due to the deficits listed below (see PT Problem List).  Pt will benefit from skilled PT to increase their independence and safety with mobility to allow discharge to the venue listed below.  Mobility limited today due to R foot pain (pt believes gout so notified RN).  Pt reports daughter lives next door and can assist upon d/c.  Anticipate pt will progress well once foot pain controlled.     Follow Up Recommendations Home health PT;Supervision/Assistance - 24 hour (initial assist due to R foot pain, may progress well once pain alleviated)    Equipment Recommendations  None recommended by PT    Recommendations for Other Services       Precautions / Restrictions Precautions Precautions: Fall Precaution Comments: wears O2 at night, R foot pain pt believes gout      Mobility  Bed Mobility Overal bed mobility: Modified Independent             General bed mobility comments: NT -- up in recliner  Transfers Overall transfer level: Needs assistance Equipment used: None Transfers: Sit to/from Stand Sit to Stand: Min assist         General transfer comment: assist for steadying, pt declined RW however required BIL UE support using bed rail and armrests due to R foot pain  Ambulation/Gait Ambulation/Gait assistance:  (declined due to pain)              Stairs            Wheelchair Mobility    Modified Rankin (Stroke Patients Only)       Balance                                             Pertinent Vitals/Pain Pain Assessment: 0-10 Pain Score: 10-Worst pain ever Pain Location: R foot Pain Descriptors / Indicators: Sore;Tender Pain Intervention(s): Limited activity within patient's tolerance;Monitored during session;Patient requesting pain meds-RN notified;Repositioned    Home Living Family/patient expects to be discharged to:: Private residence Living Arrangements: Alone Available Help at Discharge: Family (daughter lives next door)) Type of Home: House Home Access: Stairs to enter   Secretary/administrator of Steps: 2 Home Layout: One level Home Equipment: Shower seat - built in;Hand held shower head;Bedside commode;Walker - 2 wheels;Grab bars - tub/shower      Prior Function Level of Independence: Independent with assistive device(s)               Hand Dominance   Dominant Hand: Right    Extremity/Trunk Assessment   Upper Extremity Assessment: Overall WFL for tasks assessed           Lower Extremity Assessment: Generalized weakness;RLE deficits/detail RLE Deficits / Details: R foot tender to touch, pt believes gout related       Communication   Communication: HOH  Cognition Arousal/Alertness: Awake/alert Behavior During Therapy: WFL for tasks assessed/performed Overall Cognitive Status: Within Functional Limits for tasks assessed  General Comments      Exercises        Assessment/Plan    PT Assessment Patient needs continued PT services  PT Diagnosis Difficulty walking;Acute pain   PT Problem List Decreased activity tolerance;Decreased mobility;Pain  PT Treatment Interventions DME instruction;Gait training;Functional mobility training;Patient/family education;Therapeutic activities;Therapeutic exercise   PT Goals (Current goals can be found in the Care Plan section) Acute Rehab PT Goals PT Goal Formulation: With patient Time For Goal Achievement: 09/13/15 Potential to  Achieve Goals: Good    Frequency Min 3X/week   Barriers to discharge        Co-evaluation               End of Session   Activity Tolerance: Patient limited by pain Patient left: in chair;with call bell/phone within reach;with chair alarm set Nurse Communication: Mobility status (notified RN of pt's foot pain (?gout))    Functional Assessment Tool Used: Clinical judgement Functional Limitation: Mobility: Walking and moving around Mobility: Walking and Moving Around Current Status ([id number](G8978): At least 20 percent but less than 40 percent impaired, limited or restricted Mobility: Walking and Moving Around Goal Status [phone](G8979): At least 1 percent but less than 20 percent impaired, limited or restricted    Time: 0931-0939 PT Time Calculation (min) (ACUTE ONLY): 8 min   Charges:   PT Evaluation $PT Eval Low Complexity: 1 Procedure     PT G Codes:   PT G-Codes **NOT FOR INPATIENT CLASS** Functional Assessment Tool Used: Clinical judgement Functional Limitation: Mobility: Walking and moving around Mobility: Walking and Moving Around Current Status ([id number](G8978): At least 20 percent but less than 40 percent impaired, limited or restricted Mobility: Walking and Moving Around Goal Status [phone](G8979): At least 1 percent but less than 20 percent impaired, limited or restricted    Lyndsie Wallman,KATHrine E 09/06/2015, 11:09 AM Zenovia JarredKati Amarissa Koerner, PT, DPT 09/06/2015 Pager: [phone]

## 2015-09-06 NOTE — Discharge Summary (Signed)
Physician Discharge Summary  [place] WUJ:[id number] DOB: [birth year] DOA: 09/04/2015  PCP: Pamelia Hoit, MD  Admit date: 09/04/2015 Discharge date: 09/06/2015  Time spent: 65 minutes  Recommendations for Outpatient Follow-up:  1. Follow-up at PCPs office, Pamelia Hoit, MD on Monday, 09/09/2015 for PT/INR check and further recommendations on Coumadin dosing. 2. Follow-up  With Pamelia Hoit, MD in 1 week. On follow-up patient will need a basic metabolic profile done to follow-up on electrolytes and renal function. Patient also need a CBC done to follow-up on H&H and leukocytosis. Acute gouty flare will need to be followed up on.   Discharge Diagnoses:  Active Problems:   Essential hypertension   Atrial fibrillation (HCC)   CHF (congestive heart failure), NYHA class II (HCC)   CKD (chronic kidney disease), stage III   Interstitial lung disease (HCC)   Chronic respiratory failure with hypoxia (HCC)   Supratherapeutic INR   Protein-calorie malnutrition, severe   Gout flare   Discharge Condition: Stable and improved  Diet recommendation: Heart healthy  Filed Weights   09/04/15 1112 09/05/15 0457 09/06/15 0611  Weight: 48.988 kg (108 lb) 46.8 kg (103 lb 2.8 oz) 45.4 kg (100 lb 1.4 oz)    History of present illness:  Per Dr Gloriajean Dell is a 82 y.o. female with medical history significant of COPD, Afib, CHF, HTN who presented with a few hour history of coughing with yellow sputum which changed to blood tinged sputum. She reports that she was in her normal state of health until 2 am on the morning of admission when she developed increased sputum and need to clear her throat. She actually denies coughing, but her description sounds like a cough. She initially was only bringing up yellow phlegm, but this changed to blood tinged and then frank bloody (teaspoon amount). When her daughter saw that, she brought her to the ED. Ms. Caslin has a history of COPD and  was on home O2 therapy. She was found to have O2 saturation in the low 90s here and decreased breath sounds at the bases. Apparently, a few days ago she had an appointment with her PCP who heard wheezing on exam and ordered her prednisone for 5 days. She has 2 pills left. There has also been a miscommunication at her pharmacy in that she was supposed to be on 5mg  warfarin tablets 5 days a week, however, they prescribed her 2mg  tablets. This was recently discovered and she was given back the 5mg  tablets. Today, however, her INR is supratherapeutic at > 10. She has no other symptoms, denies fever, chills, chest pain, nausea, vomiting. She does have chronic diarrhea for which she is prescribed loperamide. She has recently successfully gained weight.   ED Course: O2 saturation in the low 90s (likely chronic for this lady), reported to have crackles at mid lung, however, she had just received a breathing treatment and her lung sounds were actually better. She had decreased breath sounds at the bases.    Hospital Course:  #1 Supratherapeutic INR ?? Etiology.Patient was initially on 2.5mg  coumadin which was changed to 5mg  daily. On admission patient was noted to have an INR greater than 10. Patient was given IV vitamin K 1 and INR trended down. Patient did not have any bleeding episodes during the hospitalization the patient's Coumadin was held. Patient will be discharged home on 2.5 mg of Coumadin to start on Sunday, 09/08/2015. Patient will need a PT/INR checked on Monday, 09/09/2015. On day of discharge patient's INR was  3.71. Outpatient follow-up.   #2 acute on chronic diastolic CHF On admission is concern for acute on chronic diastolic CHF and patient was placed on IV Lasix. Patient diuresed well and remained asymptomatic and was transitioned to home dose of oral Lasix. Outpatient follow-up.   #3 atrial fibrillation Continued on diltiazem for rate control. INR supratherapeutic on admission and  trended down such that by day of discharge INR was 3.71. Patient's Coumadin was held during the hospitalization and will be resumed at a lower dose of 2.5 mg on Sunday. Outpatient follow-up.  #4 acute gouty flare On day of discharge patient complained of right foot pain which she felt was secondary to an acute gout flare. Per daughter patient has had episodes of gouty flares. Patient was given a dose of IV Solu-Medrol. Patient be discharged home on 5 days of prednisone 40 mg daily. Patient will need outpatient follow-up.  #5 hypertension Stable. Continued on home regimen of Cardizem.  #6 chronic kidney disease stage III Stable.  #7 leukocytosis Unclear etiology. Chest x-ray consistent with volume overload and not pneumonia. UA with trace leukocytes nitrite negative. Patient asymptomatic. Patient had recently been on steroids MIP likely etiology of leukocytosis. Outpatient follow-up.   Procedures:  Chest x-ray 09/04/2015  Consultations:  None  Discharge Exam: Filed Vitals:   09/06/15 1032 09/06/15 1414  BP:  137/82  Pulse: 92 97  Temp:  98.3 F (36.8 C)  Resp:  18    General: NAD Cardiovascular: RRR Respiratory: CTAB Ext: No c/c/e. Right midfoot TTP.  Discharge Instructions   Discharge Instructions    Diet - low sodium heart healthy    Complete by:  As directed      Discharge instructions    Complete by:  As directed   Please get INR checked on Monday 09/09/2015. Follow up with Pamelia Hoit, MD in 1 week.     Increase activity slowly    Complete by:  As directed           Current Discharge Medication List    START taking these medications   Details  feeding supplement, ENSURE ENLIVE, (ENSURE ENLIVE) LIQD Take 237 mLs by mouth 2 (two) times daily between meals. Qty: 237 mL, Refills: 12      CONTINUE these medications which have CHANGED   Details  predniSONE (DELTASONE) 20 MG tablet Take 2 tablets (40 mg total) by mouth daily with breakfast. Take for 5  days then stop. Qty: 10 tablet, Refills: 0    warfarin (COUMADIN) 2.5 MG tablet Take 1 tablet (2.5 mg total) by mouth See admin instructions. Take 2.5mg  daily on Sun,Tues,Wed,Thurs,Sat Qty: 30 tablet, Refills: 0      CONTINUE these medications which have NOT CHANGED   Details  albuterol (VENTOLIN HFA) 108 (90 Base) MCG/ACT inhaler Inhale 1-2 puffs into the lungs 4 (four) times daily as needed for wheezing or shortness of breath.     cetirizine (ZYRTEC) 10 MG tablet Take 10 mg by mouth daily.    digoxin (LANOXIN) 0.125 MG tablet Take 125 mcg by mouth daily. Refills: 0    diltiazem (DILACOR XR) 120 MG 24 hr capsule Take 1 capsule (120 mg total) by mouth daily. Qty: 30 capsule, Refills: 0    fluticasone (FLONASE) 50 MCG/ACT nasal spray Place 1-2 sprays into both nostrils daily as needed for allergies.     Fluticasone Furoate-Vilanterol (BREO ELLIPTA) 200-25 MCG/INH AEPB Inhale 1 Inhaler into the lungs daily as needed (Shortness of Breath).  furosemide (LASIX) 20 MG tablet Take 1 tablet (20 mg total) by mouth daily. Qty: 30 tablet, Refills: 0    meloxicam (MOBIC) 7.5 MG tablet Take 7.5 mg by mouth daily as needed for pain.     Multiple Vitamin (MULTIVITAMIN) tablet Take 2 tablets by mouth daily.        Allergies  Allergen Reactions  . Cephalexin Other (See Comments)    "Causes an infection"  . Other     Pain medicine , unsure of which one, used for gout, made her feel loopy  . Penicillins Swelling    Has patient had a PCN reaction causing immediate rash, facial/tongue/throat swelling, SOB or lightheadedness with hypotension: Yes Has patient had a PCN reaction causing severe rash involving mucus membranes or skin necrosis: No Has patient had a PCN reaction that required hospitalization Yes Has patient had a PCN reaction occurring within the last 10 years: No If all of the above answers are "NO", then may proceed with Cephalosporin use.    Follow-up Information    Follow  up with Pamelia Hoit, MD. Schedule an appointment as soon as possible for a visit in 1 week.   Specialty:  Family Medicine   Contact information:   [address] [place] [address] [phone]       Follow up with Pamelia Hoit, MD On 09/09/2015.   Specialty:  Family Medicine   Why:  f/u for INR check   Contact information:   [address] [place] [address] [phone]        The results of significant diagnostics from this hospitalization (including imaging, microbiology, ancillary and laboratory) are listed below for reference.    Significant Diagnostic Studies: Dg Chest 2 View  09/04/2015  CLINICAL DATA:  Cough for past several days with wheezing EXAM: CHEST  2 VIEW COMPARISON:  January 08, 2015 FINDINGS: There are small pleural effusions bilaterally. There is bibasilar atelectasis. There is trace bibasilar edema. Heart is upper normal in size with pulmonary vascularity within normal limits. No airspace consolidation. No adenopathy. There is atherosclerotic calcification in the aorta. IMPRESSION: Suspect a degree of congestive heart failure with slight edema and small bilateral effusions. No airspace consolidation. Electronically Signed   By: Bretta Bang III M.D.   On: 09/04/2015 12:24    Microbiology: No results found for this or any previous visit (from the past 240 hour(s)).   Labs: Basic Metabolic Panel:  Recent Labs Lab 09/04/15 1129 09/05/15 0441 09/06/15 0450  NA 143 140 140  K 4.0 4.2 4.5  CL 101 96* 99*  CO2 34* 33* 33*  GLUCOSE 97 107* 128*  BUN 30* 30* 33*  CREATININE 1.08* 1.11* 1.20*  CALCIUM 8.6* 8.6* 8.7*  MG  --   --  1.9   Liver Function Tests:  Recent Labs Lab 09/04/15 1129  AST 28  ALT 30  ALKPHOS 55  BILITOT 1.0  PROT 7.5  ALBUMIN 3.4*   No results for input(s): LIPASE, AMYLASE in the last 168 hours. No results for input(s): AMMONIA in the last 168 hours. CBC:  Recent Labs Lab 09/04/15 1129  09/05/15 0441 09/06/15 0450  WBC 13.7* 13.7* 13.3*  NEUTROABS 10.3* 10.3*  --   HGB 15.0 14.7 13.8  HCT 43.9 43.1 41.0  MCV 93.8 93.5 94.3  PLT 185 175 170   Cardiac Enzymes:  Recent Labs Lab 09/04/15 1129  TROPONINI 0.03   BNP: BNP (last 3 results)  Recent Labs  01/08/15  1039 09/04/15 1129  BNP 281.6* 225.4*    ProBNP (last 3 results) No results for input(s): PROBNP in the last 8760 hours.  CBG: No results for input(s): GLUCAP in the last 168 hours.     SignedRamiro Harvest:  Jevon Shells MD.  [facility] 09/06/2015, 3:30 PM

## 2015-09-06 NOTE — Care Management Note (Signed)
Case Management Note  Patient Details  Name: Candice Rangel MRN: [id number] Date of Birth: 14-Jun-1920  Subjective/Objective:   Pt admitted with Supratherapeutic INR                Action/Plan: Discharge planning, contacted by attending, [facility] Contacted [facility] to confirm and notified them of anticipated d/c today.   Expected Discharge Date:                  Expected Discharge Plan:  Home w Home Health Services  In-House Referral:  NA  Discharge planning Services  CM Consult  Post Acute Care Choice:  Home Health Choice offered to:  Patient, Adult Children  DME Arranged:  N/A DME Agency:  NA  HH Arranged:  RN, PT, OT HH Agency:  [facility]  Status of Service:  Completed, signed off  Medicare Important Message Given:    Date Medicare IM Given:    Medicare IM give by:    Date Additional Medicare IM Given:    Additional Medicare Important Message give by:     If discussed at Long Length of Stay Meetings, dates discussed:    Additional Comments:  Candice Rangel, Candice Totty K, RN 09/06/2015, 3:17 PM

## 2015-09-06 NOTE — Progress Notes (Signed)
Occupational Therapy Evaluation Patient Details Name: Candice Rangel Aerts MRN: [id number] DOB: [birth year] Today's Date: 09/06/2015    History of Present Illness Candice Rangel Marin is a 82 y.o. female with medical history significant of COPD, Afib, CHF, HTN who presented with a few hour history of coughing with yellow sputum which changed to blood tinged sputum.Found to have supratherapeutic INR.    Clinical Impression   Limited evaluation as patient complaining of R foot pain. Nurse in to assess. Patient with decreased ADL independence and safety at this time; will continue to follow to address.    Follow Up Recommendations  Supervision/Assistance - 24 hour;Home health OT    Equipment Recommendations  None recommended by OT    Recommendations for Other Services       Precautions / Restrictions Precautions Precautions: Fall Precaution Comments: wears O2 at night, R foot pain pt believes gout      Mobility Bed Mobility               General bed mobility comments: NT -- up in recliner  Transfers                 General transfer comment: patient refused due to R foot pain    Balance                                            ADL Overall ADL's : Needs assistance/impaired Eating/Feeding: Set up;Sitting   Grooming: Set up;Sitting   Upper Body Bathing: Set up;Sitting   Lower Body Bathing: Moderate assistance;Maximal assistance Lower Body Bathing Details (indicate cue type and reason): due to R foot pain Upper Body Dressing : Set up;Sitting   Lower Body Dressing: Moderate assistance;Maximal assistance Lower Body Dressing Details (indicate cue type and reason): due to R foot pain               General ADL Comments: Patient reports 10/10 R foot pain, thinks it may be gout. RN made aware. She was up in recliner, but declined further mobility at this time. [facility]. Patient able to  perform simple ADLs from chair level. Limited evaluation due to pain.     Vision     Perception     Praxis      Pertinent Vitals/Pain Pain Assessment: 0-10 Pain Score: 10-Worst pain ever Pain Location: R foot Pain Descriptors / Indicators: Sore;Constant Pain Intervention(s): Limited activity within patient's tolerance;Monitored during session;Patient requesting pain meds-RN notified     Hand Dominance Right   Extremity/Trunk Assessment Upper Extremity Assessment Upper Extremity Assessment: Overall WFL for tasks assessed   Lower Extremity Assessment Lower Extremity Assessment: Defer to PT evaluation       Communication Communication Communication: HOH   Cognition Arousal/Alertness: Awake/alert Behavior During Therapy: WFL for tasks assessed/performed Overall Cognitive Status: Within Functional Limits for tasks assessed                     General Comments       Exercises       Shoulder Instructions      Home Living Family/patient expects to be discharged to:: Private residence Living Arrangements: Alone Available Help at Discharge: Other (Comment);Family (daughter lives next door)) Type of Home: House Home Access: Stairs to enter Enter[employer] CorporationEntrance Stairs-Number of Steps: 2   Home Layout: One  level     Bathroom Shower/Tub: Producer, television/film/video: Standard     Home Equipment: Information systems manager - built in;Hand held shower head;Bedside commode;Walker - 2 wheels;Grab bars - tub/shower          Prior Functioning/Environment Level of Independence: Independent with assistive device(s)             OT Diagnosis: Acute pain   OT Problem List: Decreased strength;Decreased activity tolerance;Pain   OT Treatment/Interventions: Self-care/ADL training;DME and/or AE instruction;Therapeutic activities;Patient/family education    OT Goals(Current goals can be found in the care plan section) Acute Rehab OT Goals OT Goal Formulation: With patient Time  For Goal Achievement: 09/20/15 Potential to Achieve Goals: Good ADL Goals Pt Will Perform Lower Body Bathing: with supervision;sit to/from stand Pt Will Perform Lower Body Dressing: with supervision;sit to/from stand Pt Will Transfer to Toilet: with supervision;ambulating Pt Will Perform Toileting - Clothing Manipulation and hygiene: with supervision;sit to/from stand  OT Frequency: Min 2X/week   Barriers to D/C:            Co-evaluation              End of Session Nurse Communication: Patient requests pain meds;Other (comment) (R foot pain)  Activity Tolerance: Patient limited by pain Patient left: in chair;with call bell/phone within reach;with chair alarm set;with nursing/sitter in room   Time: 0957-1007 OT Time Calculation (min): 10 min Charges:  OT General Charges $OT Visit: 1 Procedure OT Evaluation $OT Eval Low Complexity: 1 Procedure G-Codes: OT G-codes **NOT FOR INPATIENT CLASS** Functional Assessment Tool Used: clinical judgment Functional Limitation: Self care Self Care Current Status (Z6109): At least 60 percent but less than 80 percent impaired, limited or restricted Self Care Goal Status (U0454): At least 1 percent but less than 20 percent impaired, limited or restricted  Jenay Morici A 09/06/2015, 10:32 AM

## 2015-09-06 NOTE — Progress Notes (Signed)
Patient C/o of right foot pain-gout, notified Dr. Janee Mornhompson order given. Will continue to assess patient.

## 2015-09-06 NOTE — Progress Notes (Signed)
Patient discharged home with daughter, discharge instructions given and explained to patient/daughter and they verbalized understanding. Patient denies any pain/distress. Accompanied home by daughter. Skin intact, no wound noted. Transported to the car by staff.

## 2015-09-07 LAB — URINE CULTURE

## 2015-12-31 ENCOUNTER — Encounter (HOSPITAL_COMMUNITY): Payer: Self-pay | Admitting: Emergency Medicine

## 2015-12-31 ENCOUNTER — Emergency Department (HOSPITAL_COMMUNITY): Payer: Medicare HMO

## 2015-12-31 ENCOUNTER — Emergency Department (HOSPITAL_COMMUNITY)
Admission: EM | Admit: 2015-12-31 | Discharge: 2015-12-31 | Disposition: A | Discharge status: Home / Self Care | Payer: Medicare HMO | Attending: Emergency Medicine | Admitting: Emergency Medicine

## 2015-12-31 DIAGNOSIS — I130 Hypertensive heart and chronic kidney disease with heart failure and stage 1 through stage 4 chronic kidney disease, or unspecified chronic kidney disease: Secondary | ICD-10-CM | POA: Diagnosis not present

## 2015-12-31 DIAGNOSIS — Z7901 Long term (current) use of anticoagulants: Secondary | ICD-10-CM | POA: Diagnosis not present

## 2015-12-31 DIAGNOSIS — R0600 Dyspnea, unspecified: Secondary | ICD-10-CM | POA: Insufficient documentation

## 2015-12-31 DIAGNOSIS — J449 Chronic obstructive pulmonary disease, unspecified: Secondary | ICD-10-CM | POA: Diagnosis not present

## 2015-12-31 DIAGNOSIS — R531 Weakness: Secondary | ICD-10-CM

## 2015-12-31 DIAGNOSIS — I509 Heart failure, unspecified: Secondary | ICD-10-CM | POA: Diagnosis not present

## 2015-12-31 DIAGNOSIS — N183 Chronic kidney disease, stage 3 (moderate): Secondary | ICD-10-CM | POA: Insufficient documentation

## 2015-12-31 DIAGNOSIS — Z791 Long term (current) use of non-steroidal anti-inflammatories (NSAID): Secondary | ICD-10-CM | POA: Insufficient documentation

## 2015-12-31 DIAGNOSIS — N390 Urinary tract infection, site not specified: Secondary | ICD-10-CM | POA: Diagnosis not present

## 2015-12-31 DIAGNOSIS — M6281 Muscle weakness (generalized): Secondary | ICD-10-CM | POA: Diagnosis present

## 2015-12-31 LAB — CBC WITH DIFFERENTIAL/PLATELET
BASOS ABS: 0 10*3/uL (ref 0.0–0.1)
BASOS PCT: 0 %
EOS ABS: 0.1 10*3/uL (ref 0.0–0.7)
EOS PCT: 1 %
HCT: 37.9 % (ref 36.0–46.0)
Hemoglobin: 12.1 g/dL (ref 12.0–15.0)
Lymphocytes Relative: 15 %
Lymphs Abs: 0.9 10*3/uL (ref 0.7–4.0)
MCH: 31.8 pg (ref 26.0–34.0)
MCHC: 31.9 g/dL (ref 30.0–36.0)
MCV: 99.5 fL (ref 78.0–100.0)
Monocytes Absolute: 0.8 10*3/uL (ref 0.1–1.0)
Monocytes Relative: 13 %
NEUTROS PCT: 71 %
Neutro Abs: 4.2 10*3/uL (ref 1.7–7.7)
PLATELETS: 167 10*3/uL (ref 150–400)
RBC: 3.81 MIL/uL — AB (ref 3.87–5.11)
RDW: 15.3 % (ref 11.5–15.5)
WBC: 6 10*3/uL (ref 4.0–10.5)

## 2015-12-31 LAB — URINE MICROSCOPIC-ADD ON

## 2015-12-31 LAB — COMPREHENSIVE METABOLIC PANEL
ALBUMIN: 3 g/dL — AB (ref 3.5–5.0)
ALT: 16 U/L (ref 14–54)
AST: 23 U/L (ref 15–41)
Alkaline Phosphatase: 56 U/L (ref 38–126)
Anion gap: 4 — ABNORMAL LOW (ref 5–15)
BUN: 19 mg/dL (ref 6–20)
CHLORIDE: 102 mmol/L (ref 101–111)
CO2: 31 mmol/L (ref 22–32)
CREATININE: 1 mg/dL (ref 0.44–1.00)
Calcium: 8.5 mg/dL — ABNORMAL LOW (ref 8.9–10.3)
GFR calc non Af Amer: 47 mL/min — ABNORMAL LOW (ref >60)
GFR, EST AFRICAN AMERICAN: 55 mL/min — AB (ref >60)
GLUCOSE: 109 mg/dL — AB (ref 65–99)
Potassium: 4.1 mmol/L (ref 3.5–5.1)
SODIUM: 137 mmol/L (ref 135–145)
Total Bilirubin: 0.6 mg/dL (ref 0.3–1.2)
Total Protein: 7.6 g/dL (ref 6.5–8.1)

## 2015-12-31 LAB — URINALYSIS, ROUTINE W REFLEX MICROSCOPIC
BILIRUBIN URINE: NEGATIVE
Glucose, UA: NEGATIVE mg/dL
Ketones, ur: NEGATIVE mg/dL
NITRITE: NEGATIVE
Protein, ur: 30 mg/dL — AB
SPECIFIC GRAVITY, URINE: 1.01 (ref 1.005–1.030)
pH: 7 (ref 5.0–8.0)

## 2015-12-31 LAB — I-STAT TROPONIN, ED
TROPONIN I, POC: 0.01 ng/mL (ref 0.00–0.08)
TROPONIN I, POC: 0.02 ng/mL (ref 0.00–0.08)

## 2015-12-31 LAB — PROTIME-INR
INR: 2.55
Prothrombin Time: 27.9 seconds — ABNORMAL HIGH (ref 11.4–15.2)

## 2015-12-31 LAB — BRAIN NATRIURETIC PEPTIDE: B Natriuretic Peptide: 269 pg/mL — ABNORMAL HIGH (ref 0.0–100.0)

## 2015-12-31 LAB — DIGOXIN LEVEL: Digoxin Level: 1.9 ng/mL (ref 0.8–2.0)

## 2015-12-31 MED ORDER — DILTIAZEM HCL ER COATED BEADS 120 MG PO CP24
120.0000 mg | ORAL_CAPSULE | Freq: Once | ORAL | Status: AC
  Administered 2015-12-31: 120 mg via ORAL
  Filled 2015-12-31: qty 1

## 2015-12-31 MED ORDER — FUROSEMIDE 10 MG/ML IJ SOLN
20.0000 mg | Freq: Once | INTRAMUSCULAR | Status: AC
  Administered 2015-12-31: 20 mg via INTRAVENOUS
  Filled 2015-12-31: qty 4

## 2015-12-31 MED ORDER — CIPROFLOXACIN HCL 500 MG PO TABS: 500.0000 mg | ORAL_TABLET | Freq: Two times a day (BID) | ORAL | 0 refills | Status: AC

## 2015-12-31 NOTE — ED Triage Notes (Signed)
Per EMS pt comes from home for SOB x week. Patient on home 2L O2 but stating that her tubing is about 75 feet long and can't feel the O2 flow.  Patient also made her daughter put wheelchair in garage as patient has been trying to be independent at home by not using it.  Patient does have PMH COPD, afib.

## 2015-12-31 NOTE — ED Notes (Signed)
Bed: [facility] Expected date:  Expected time:  Means of arrival:  Comments: 

## 2015-12-31 NOTE — ED Provider Notes (Signed)
[facility]-EMERGENCY DEPT Provider Note   CSN: [id number] Arrival date & time: 12/31/15  4098     History   Chief Complaint Chief Complaint  Patient presents with  . Shortness of Breath    HPI Candice Rangel is a 82 y.o. female.  HPI  For a few weeks has had increasing shortness of breath, worse today.  Used to have 2L of O2 at night but now using during the day.  O2 not helping any more. Per EMS had 53ft tubing to O2 at home, and pt not using wheelchair anymore.  No cough.  No chest pain.  No fever.   Severe generalized weakness this AM making it difficult to get out of bed. Felt very fatigued.  Dyspnea has been moderate and increasing.   Past Medical History:  Diagnosis Date  . Arthritis    "back" (02/12/2014)  . Atrial fibrillation (HCC)   . Bronchiectasis    History of  . COPD (chronic obstructive pulmonary disease) (HCC)   . Drug therapy    coumadin  . Hypertension   . On home oxygen therapy    "prn for the last week" (02/12/2014)  . Pneumonia "lots of times"  . Syncope     Patient Active Problem List   Diagnosis Date Noted  . Gout flare 09/06/2015  . Protein-calorie malnutrition, severe 09/05/2015  . Supratherapeutic INR 09/04/2015  . Weakness 02/12/2014  . Elevated troponin 02/12/2014  . Left leg weakness 02/12/2014  . Interstitial lung disease (HCC) 02/08/2014  . Chronic respiratory failure with hypoxia (HCC) 02/08/2014  . CKD (chronic kidney disease), stage III 02/06/2014  . Acute on chronic diastolic CHF (congestive heart failure) (HCC) 02/05/2014  . CHF (congestive heart failure), NYHA class II (HCC) 02/05/2014  . 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 Dyspnea 05/25/2012  . PATENT FORAMEN OVALE 03/27/2009  . Essential hypertension 03/25/2009  . Atrial fibrillation (HCC) 03/25/2009  . SYNCOPE 03/25/2009    Past Surgical History:  Procedure Laterality Date  . CATARACT EXTRACTION, BILATERAL Bilateral   . CESAREAN SECTION  1958  . FEMUR FRACTURE SURGERY Right    Following a trauma  . FRACTURE SURGERY    . HIP FRACTURE SURGERY Right    pt denies this hx on 02/12/2014 "It was my leg, not my hip"  . KNEE ARTHROCENTESIS Left 07/2007   and injection  . TONSILLECTOMY      OB History    No data available       Home Medications    Prior to Admission medications   Medication Sig Start Date End Date Taking? Authorizing Provider  albuterol (VENTOLIN HFA) 108 (90 Base) MCG/ACT inhaler Inhale 1-2 puffs into the lungs 4 (four) times daily as needed for wheezing or shortness of breath.  08/24/15  Yes Historical Provider, MD  cetirizine (ZYRTEC) 10 MG tablet Take 10 mg by mouth daily.   Yes Historical Provider, MD  digoxin (LANOXIN) 0.125 MG tablet Take 125 mcg by mouth daily. 10/31/14  Yes Historical Provider, MD  diltiazem (DILACOR XR) 120 MG 24 hr capsule Take 1 capsule (120 mg total) by mouth daily. 02/08/14  Yes Renae Fickle, MD  feeding supplement, ENSURE ENLIVE, (ENSURE ENLIVE) LIQD Take 237 mLs by mouth 2 (two) times daily between meals. Patient taking differently: Take 237 mLs by mouth daily.  09/06/15  Yes Rodolph Bong, MD  fluticasone [facility]) 50 MCG/ACT nasal spray Place 1-2 sprays into both nostrils daily as needed for allergies.  05/30/15  Yes Historical Provider, MD  Fluticasone Furoate-Vilanterol (BREO ELLIPTA) 200-25 MCG/INH AEPB Inhale 1 Inhaler into the lungs daily as needed (Shortness of Breath).    Yes Historical Provider, MD  furosemide (LASIX) 20 MG tablet Take 1 tablet (20 mg total) by mouth daily. 02/14/14  Yes Belkys A Regalado, MD  meloxicam (MOBIC) 7.5 MG tablet Take 7.5 mg by mouth daily as needed for pain.  08/14/15  Yes Historical Provider, MD  Multiple Vitamin (MULTIVITAMIN) tablet Take 2 tablets by mouth daily.    Yes Historical Provider, MD  warfarin (COUMADIN) 2 MG tablet Take 2 mg by mouth as directed. Take 2mg  five days a week.. Tuesday, Wednesday, Thursday, Saturday and Sunday 11/22/15  Yes Historical Provider, MD    ciprofloxacin (CIPRO) 500 MG tablet Take 1 tablet (500 mg total) by mouth every 12 (twelve) hours. 12/31/15 01/07/16  Alvira MondayErin Indy Kuck, MD  warfarin (COUMADIN) 2.5 MG tablet Take 1 tablet (2.5 mg total) by mouth See admin instructions. Take 2.5mg  daily on Sun,Tues,Wed,Thurs,Sat Patient not taking: Reported on 12/31/2015 09/06/15   Rodolph Bonganiel V Thompson, MD    Family History Family History  Problem Relation Age of Onset  . Heart attack Mother [id number]  . Pneumonia Father     Social History Social History  Substance Use Topics  . Smoking status: Never Smoker  . Smokeless tobacco: Never Used  . Alcohol use No     Allergies   Cephalexin; Other; and Penicillins   Review of Systems Review of Systems  Constitutional: Positive for fatigue. Negative for fever.  HENT: Negative for sore throat.   Eyes: Negative for visual disturbance.  Respiratory: Positive for shortness of breath. Negative for cough.   Cardiovascular: Positive for leg swelling (chronic). Negative for chest pain.  Gastrointestinal: Negative for abdominal pain, diarrhea (unchanged), nausea and vomiting.  Genitourinary: Negative for difficulty urinating ("just comes out" unchanged) and dysuria.  Musculoskeletal: Negative for back pain and neck pain.  Skin: Negative for rash.  Neurological: Negative for syncope and headaches.     Physical Exam Updated Vital Signs BP 130/80 (BP Location: Left Arm)   Pulse 94   Temp 98 F (36.7 C) (Oral)   Resp 20   Ht 5\' 2"  (1.575 m)   Wt 100 lb (45.4 kg)   SpO2 97%   BMI 18.29 kg/m   Physical Exam  Constitutional: She is oriented to person, place, and time. She appears well-developed and well-nourished. No distress.  HENT:  Head: Normocephalic and atraumatic.  Eyes: Conjunctivae and EOM are normal.  Neck: Normal range of motion. JVD present.  Cardiovascular: Normal rate, normal heart sounds and intact distal pulses.  An irregularly irregular rhythm present. Exam reveals no gallop and  no friction rub.   No murmur heard. Pulmonary/Chest: Effort normal and breath sounds normal. No respiratory distress. She has no wheezes. She has no rales.  Abdominal: Soft. She exhibits no distension. There is no tenderness. There is no guarding.  Musculoskeletal: She exhibits no edema or tenderness.  Neurological: She is alert and oriented to person, place, and time.  Skin: Skin is warm and dry. No rash noted. She is not diaphoretic. No erythema.  Nursing note and vitals reviewed.    ED Treatments / Results  Labs (all labs ordered are listed, but only abnormal results are displayed) Labs Reviewed  CBC WITH DIFFERENTIAL/PLATELET - Abnormal; Notable for the following:       Result Value   RBC 3.81 (*)    All other components within normal limits  COMPREHENSIVE METABOLIC PANEL -  Abnormal; Notable for the following:    Glucose, Bld 109 (*)    Calcium 8.5 (*)    Albumin 3.0 (*)    GFR calc non Af Amer 47 (*)    GFR calc Af Amer 55 (*)    Anion gap 4 (*)    All other components within normal limits  PROTIME-INR - Abnormal; Notable for the following:    Prothrombin Time 27.9 (*)    All other components within normal limits  BRAIN NATRIURETIC PEPTIDE - Abnormal; Notable for the following:    B Natriuretic Peptide 269.0 (*)    All other components within normal limits  URINALYSIS, ROUTINE W REFLEX MICROSCOPIC (NOT AT [facility]) - Abnormal; Notable for the following:    APPearance CLOUDY (*)    Hgb urine dipstick SMALL (*)    Protein, ur 30 (*)    Leukocytes, UA TRACE (*)    All other components within normal limits  URINE MICROSCOPIC-ADD ON - Abnormal; Notable for the following:    Squamous Epithelial / LPF 6-30 (*)    Bacteria, UA MANY (*)    All other components within normal limits  URINE CULTURE  DIGOXIN LEVEL  I-STAT TROPOININ, ED  I-STAT TROPOININ, ED    EKG  EKG Interpretation  Date/Time:  Tuesday December 31 2015 09:13:22 EDT Ventricular Rate:  71 PR Interval:    QRS  Duration: 84 QT Interval:  356 QTC Calculation: 387 R Axis:   -49 Text Interpretation:  Atrial fibrillation Multiple ventricular premature complexes Left anterior fascicular block Anterior infarct, old Nonspecific T abnormalities, lateral leads No significant change since last tracing Confirmed by [facility] MD, Emmarae Cowdery ([id number]) on 12/31/2015 10:32:13 AM       Radiology Dg Chest 2 View  Result Date: 12/31/2015 CLINICAL DATA:  Worsening shortness of breath starting last week EXAM: CHEST  2 VIEW COMPARISON:  09/04/2015 FINDINGS: Cardiomegaly is noted. Central mild vascular congestion without convincing pulmonary edema. Bilateral small pleural effusion left greater than right with bilateral basilar atelectasis or infiltrate. IMPRESSION: Central vascular congestion without convincing pulmonary edema. Cardiomegaly. Bilateral small pleural effusion left greater than right with bilateral basilar atelectasis or infiltrate. Electronically Signed   By: Natasha MeadLiviu  Pop M.D.   On: 12/31/2015 09:57    Procedures Procedures (including critical care time)  Medications Ordered in ED Medications  diltiazem (CARDIZEM CD) 24 hr capsule 120 mg (120 mg Oral Given 12/31/15 1027)  furosemide (LASIX) injection 20 mg (20 mg Intravenous Given 12/31/15 1203)     Initial Impression / Assessment and Plan / ED Course  I have reviewed the triage vital signs and the nursing notes.  Pertinent labs & imaging results that were available during my care of the patient were reviewed by me and considered in my medical decision making (see chart for details).  Clinical Course    82 year old female with a history of COPD On 2 L oxygen at home, atrial fibrillation on Coumadin and digoxin, bronchiectasis, hypertension chronic kidney disease, who presents with concern of  shortness of breath.  Differential diagnosis for dyspnea includes ACS, PE, COPD exacerbation, CHF exacerbation, anemia, pneumonia.  Chest x-ray was done which showed  mild vascular congestion, small bilateral effusions. EKG was evaluated by me which showed atrial fibrillation without acute change from prior.  BNP was 269, similar to prior.  Patient without increased cough, no fever, no leukocytosis and doubt pneumonia. She has not had acute onset shortness of breath, and feel pulmonary embolus is less likely than other  possible etiologies. Patient does not have wheezing on exam, and doubt COPD exacerbation. Patient with insidious onset of shortness of breath, no chest pain, negative troponins x2, and doubt ACS. Digoxin level and INR WNL. Given generalized weakness, urinalysis was performed which showed signs of UTI.     Patient hypertensive to 200 systolic on arrival to the emergency department, and has not taken her blood pressure medications today. She is given her home dose of Cardizem. Given mild fluid overload, we'll give 1 dose of Lasix here. Feel likely etiology of patient's dyspnea over the last few weeks, is for oxygen delivery using 75 feet of tubing with her nasal cannula.  Recommend shorter tubing, close PCP follow up.  Pt given rx for cipro for concern for UTI contributing to symptoms.  Discussed need for close monitoring of INR in setting of abx use and need for close follow up. Patient discharged in stable condition with understanding of reasons to return.  Final Clinical Impressions(s) / ED Diagnoses   Final diagnoses:  Dyspnea  Urinary tract infection without hematuria, site unspecified  Generalized weakness    New Prescriptions Discharge Medication List as of 12/31/2015  1:24 PM    START taking these medications   Details  ciprofloxacin (CIPRO) 500 MG tablet Take 1 tablet (500 mg total) by mouth every 12 (twelve) hours., Starting Tue 12/31/2015, Until Tue 01/07/2016, Print         Alvira Monday, MD 01/01/16 [phone]

## 2015-12-31 NOTE — ED Notes (Signed)
Bed: [id number] Expected date:  Expected time:  Means of arrival:  Comments: EMS- 82yo F, SOB

## 2016-01-01 LAB — URINE CULTURE

## 2016-01-16 ENCOUNTER — Encounter (HOSPITAL_COMMUNITY): Payer: Self-pay | Admitting: Emergency Medicine

## 2016-01-16 ENCOUNTER — Emergency Department (HOSPITAL_COMMUNITY): Payer: Medicare HMO

## 2016-01-16 ENCOUNTER — Emergency Department (HOSPITAL_COMMUNITY)
Admission: EM | Admit: 2016-01-16 | Discharge: 2016-01-16 | Disposition: A | Discharge status: Home / Self Care | Payer: Medicare HMO | Attending: Emergency Medicine | Admitting: Emergency Medicine

## 2016-01-16 DIAGNOSIS — N183 Chronic kidney disease, stage 3 (moderate): Secondary | ICD-10-CM | POA: Diagnosis not present

## 2016-01-16 DIAGNOSIS — Z79899 Other long term (current) drug therapy: Secondary | ICD-10-CM | POA: Insufficient documentation

## 2016-01-16 DIAGNOSIS — R0600 Dyspnea, unspecified: Secondary | ICD-10-CM | POA: Diagnosis present

## 2016-01-16 DIAGNOSIS — J449 Chronic obstructive pulmonary disease, unspecified: Secondary | ICD-10-CM | POA: Diagnosis not present

## 2016-01-16 DIAGNOSIS — Z7951 Long term (current) use of inhaled steroids: Secondary | ICD-10-CM | POA: Insufficient documentation

## 2016-01-16 DIAGNOSIS — I509 Heart failure, unspecified: Secondary | ICD-10-CM | POA: Insufficient documentation

## 2016-01-16 DIAGNOSIS — I130 Hypertensive heart and chronic kidney disease with heart failure and stage 1 through stage 4 chronic kidney disease, or unspecified chronic kidney disease: Secondary | ICD-10-CM | POA: Insufficient documentation

## 2016-01-16 LAB — CBC WITH DIFFERENTIAL/PLATELET
BASOS ABS: 0 10*3/uL (ref 0.0–0.1)
BASOS PCT: 0 %
Eosinophils Absolute: 0.1 10*3/uL (ref 0.0–0.7)
Eosinophils Relative: 1 %
HEMATOCRIT: 39.1 % (ref 36.0–46.0)
HEMOGLOBIN: 12.7 g/dL (ref 12.0–15.0)
Lymphocytes Relative: 35 %
Lymphs Abs: 2.7 10*3/uL (ref 0.7–4.0)
MCH: 31.6 pg (ref 26.0–34.0)
MCHC: 32.5 g/dL (ref 30.0–36.0)
MCV: 97.3 fL (ref 78.0–100.0)
Monocytes Absolute: 0.7 10*3/uL (ref 0.1–1.0)
Monocytes Relative: 9 %
NEUTROS ABS: 4.3 10*3/uL (ref 1.7–7.7)
NEUTROS PCT: 55 %
Platelets: 161 10*3/uL (ref 150–400)
RBC: 4.02 MIL/uL (ref 3.87–5.11)
RDW: 15.6 % — ABNORMAL HIGH (ref 11.5–15.5)
WBC: 7.8 10*3/uL (ref 4.0–10.5)

## 2016-01-16 LAB — URINALYSIS, ROUTINE W REFLEX MICROSCOPIC
Bilirubin Urine: NEGATIVE
Glucose, UA: NEGATIVE mg/dL
Ketones, ur: NEGATIVE mg/dL
LEUKOCYTES UA: NEGATIVE
NITRITE: NEGATIVE
PH: 5 (ref 5.0–8.0)
Protein, ur: 100 mg/dL — AB
SPECIFIC GRAVITY, URINE: 1.022 (ref 1.005–1.030)

## 2016-01-16 LAB — BASIC METABOLIC PANEL
ANION GAP: 6 (ref 5–15)
BUN: 26 mg/dL — ABNORMAL HIGH (ref 6–20)
CALCIUM: 8.9 mg/dL (ref 8.9–10.3)
CO2: 34 mmol/L — AB (ref 22–32)
Chloride: 99 mmol/L — ABNORMAL LOW (ref 101–111)
Creatinine, Ser: 0.98 mg/dL (ref 0.44–1.00)
GFR, EST AFRICAN AMERICAN: 56 mL/min — AB (ref >60)
GFR, EST NON AFRICAN AMERICAN: 48 mL/min — AB (ref >60)
Glucose, Bld: 128 mg/dL — ABNORMAL HIGH (ref 65–99)
Potassium: 3.8 mmol/L (ref 3.5–5.1)
Sodium: 139 mmol/L (ref 135–145)

## 2016-01-16 LAB — URINE MICROSCOPIC-ADD ON

## 2016-01-16 LAB — D-DIMER, QUANTITATIVE (NOT AT ARMC): D DIMER QUANT: 3.53 ug{FEU}/mL — AB (ref 0.00–0.50)

## 2016-01-16 LAB — BRAIN NATRIURETIC PEPTIDE: B NATRIURETIC PEPTIDE 5: 243.7 pg/mL — AB (ref 0.0–100.0)

## 2016-01-16 MED ORDER — FLUTICASONE FUROATE-VILANTEROL 200-25 MCG/INH IN AEPB: 1.0000 | INHALATION_SPRAY | Freq: Every day | RESPIRATORY_TRACT | 0 refills | Status: AC | PRN

## 2016-01-16 MED ORDER — ALBUTEROL SULFATE (2.5 MG/3ML) 0.083% IN NEBU: 5.0000 mg | INHALATION_SOLUTION | Freq: Once | RESPIRATORY_TRACT | Status: DC

## 2016-01-16 MED ORDER — IOPAMIDOL (ISOVUE-370) INJECTION 76%
100.0000 mL | Freq: Once | INTRAVENOUS | Status: AC | PRN
  Administered 2016-01-16: 75 mL via INTRAVENOUS

## 2016-01-16 MED ORDER — ALBUTEROL SULFATE HFA 108 (90 BASE) MCG/ACT IN AERS: 1.0000 | INHALATION_SPRAY | Freq: Four times a day (QID) | RESPIRATORY_TRACT | 0 refills | Status: AC | PRN

## 2016-01-16 MED ORDER — IPRATROPIUM-ALBUTEROL 0.5-2.5 (3) MG/3ML IN SOLN
3.0000 mL | Freq: Once | RESPIRATORY_TRACT | Status: AC
  Administered 2016-01-16: 3 mL via RESPIRATORY_TRACT
  Filled 2016-01-16: qty 3

## 2016-01-16 NOTE — ED Notes (Signed)
Bed: WA09 Expected date:  Expected time:  Means of arrival:  Comments: Short of breath, cough

## 2016-01-16 NOTE — ED Notes (Signed)
Family is en route to pick Pt up

## 2016-01-16 NOTE — Discharge Instructions (Signed)
Take 2 Lasix(furosemide) tablets each day, for 3 days. It is best to separate them by about 12 hours.  Follow-up with your primary care doctor early next week for a checkup and further evaluation is needed.  Continue using her oxygen as needed for trouble breathing.  Be careful about your salt intake.  Sometimes furosemide can drop your potassium, so make sure that you are eating foods that pain, plenty of potassium.

## 2016-01-16 NOTE — ED Provider Notes (Signed)
[facility]-EMERGENCY DEPT Provider Note   CSN: [id number] Arrival date & time: 01/16/16  0709     History   Chief Complaint No chief complaint on file.   HPI Raynelle DickYolanda Tramontana is a 82 y.o. female.  She woke this morning about 4 AM coughing, producing sputum that was "pink color". She did not use any medication for breathing this morning. She states her appetite has been "off", for several days. She denies fever, nausea, vomiting, chest pain, abdominal pain, back pain, weakness or dizziness. She was in the emergency department about 2 weeks ago, with vague symptoms including respiratory, discomfort, and was diagnosed with UTI. She was admitted overnight, and started on oral antibiotics. Urine culture from that visit was remarkable for multiple species. Patient lives alone with family members close by. There are no other no modifying factors.  HPI  Past Medical History:  Diagnosis Date  . Arthritis    "back" (02/12/2014)  . Atrial fibrillation (HCC)   . Bronchiectasis    History of  . COPD (chronic obstructive pulmonary disease) (HCC)   . Drug therapy    coumadin  . Hypertension   . On home oxygen therapy    "prn for the last week" (02/12/2014)  . Pneumonia "lots of times"  . Syncope     Patient Active Problem List   Diagnosis Date Noted  . Gout flare 09/06/2015  . Protein-calorie malnutrition, severe 09/05/2015  . Supratherapeutic INR 09/04/2015  . Weakness 02/12/2014  . Elevated troponin 02/12/2014  . Left leg weakness 02/12/2014  . Interstitial lung disease (HCC) 02/08/2014  . Chronic respiratory failure with hypoxia (HCC) 02/08/2014  . CKD (chronic kidney disease), stage III 02/06/2014  . Acute on chronic diastolic CHF (congestive heart failure) (HCC) 02/05/2014  . CHF (congestive heart failure), NYHA class II (HCC) 02/05/2014  . 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 Dyspnea 05/25/2012  . PATENT FORAMEN OVALE 03/27/2009  . Essential hypertension 03/25/2009  . Atrial fibrillation  (HCC) 03/25/2009  . SYNCOPE 03/25/2009    Past Surgical History:  Procedure Laterality Date  . CATARACT EXTRACTION, BILATERAL Bilateral   . CESAREAN SECTION  1958  . FEMUR FRACTURE SURGERY Right    Following a trauma  . FRACTURE SURGERY    . HIP FRACTURE SURGERY Right    pt denies this hx on 02/12/2014 "It was my leg, not my hip"  . KNEE ARTHROCENTESIS Left 07/2007   and injection  . TONSILLECTOMY      OB History    No data available       Home Medications    Prior to Admission medications   Medication Sig Start Date End Date Taking? Authorizing Provider  albuterol (VENTOLIN HFA) 108 (90 Base) MCG/ACT inhaler Inhale 1-2 puffs into the lungs 4 (four) times daily as needed for wheezing or shortness of breath.  08/24/15  Yes Historical Provider, MD  cetirizine (ZYRTEC) 10 MG tablet Take 10 mg by mouth daily.   Yes Historical Provider, MD  digoxin (LANOXIN) 0.125 MG tablet Take 125 mcg by mouth daily. 10/31/14  Yes Historical Provider, MD  diltiazem (DILACOR XR) 120 MG 24 hr capsule Take 1 capsule (120 mg total) by mouth daily. 02/08/14  Yes Renae FickleMackenzie Short, MD  feeding supplement, ENSURE ENLIVE, (ENSURE ENLIVE) LIQD Take 237 mLs by mouth 2 (two) times daily between meals. Patient taking differently: Take 237 mLs by mouth daily.  09/06/15  Yes Rodolph Bonganiel V Thompson, MD  fluticasone [facility](FLONASE) 50 MCG/ACT nasal spray Place 1-2 sprays into both nostrils  daily as needed for allergies.  05/30/15  Yes Historical Provider, MD  Fluticasone Furoate-Vilanterol (BREO ELLIPTA) 200-25 MCG/INH AEPB Inhale 1 Inhaler into the lungs daily as needed (Shortness of Breath).    Yes Historical Provider, MD  furosemide (LASIX) 20 MG tablet Take 1 tablet (20 mg total) by mouth daily. 02/14/14  Yes Belkys A Regalado, MD  meloxicam (MOBIC) 7.5 MG tablet Take 7.5 mg by mouth daily as needed for pain.  08/14/15  Yes Historical Provider, MD  Multiple Vitamin (MULTIVITAMIN) tablet Take 2 tablets by mouth daily.    Yes  Historical Provider, MD  warfarin (COUMADIN) 2 MG tablet Take 2 mg by mouth as directed. Take 2mg  five days a week.. Tuesday, Wednesday, Thursday, Saturday and Sunday 11/22/15  Yes Historical Provider, MD  warfarin (COUMADIN) 2.5 MG tablet Take 1 tablet (2.5 mg total) by mouth See admin instructions. Take 2.5mg  daily on Sun,Tues,Wed,Thurs,Sat Patient not taking: Reported on 12/31/2015 09/06/15   Rodolph Bong, MD    Family History Family History  Problem Relation Age of Onset  . Heart attack Mother 47  . Pneumonia Father     Social History Social History  Substance Use Topics  . Smoking status: Never Smoker  . Smokeless tobacco: Never Used  . Alcohol use No     Allergies   Cephalexin; Other; and Penicillins   Review of Systems Review of Systems  All other systems reviewed and are negative.    Physical Exam Updated Vital Signs BP (!) 120/105 (BP Location: Right Arm)   Pulse 80   Temp 97.9 F (36.6 C) (Oral)   Resp 20   SpO2 98%   Physical Exam  Constitutional: She is oriented to person, place, and time. She appears well-developed.  Elderly, frail  HENT:  Head: Normocephalic and atraumatic.  Eyes: Conjunctivae and EOM are normal. Pupils are equal, round, and reactive to light.  Neck: Normal range of motion and phonation normal. Neck supple.  Cardiovascular: Normal rate and regular rhythm.   Pulmonary/Chest: Effort normal. No respiratory distress. She exhibits no tenderness.  Scattered rhonchi without wheezing or rales. There is no increased work of breathing.  Abdominal: Soft. She exhibits no distension. There is no tenderness. There is no guarding.  Musculoskeletal: Normal range of motion. She exhibits no edema or tenderness.  Neurological: She is alert and oriented to person, place, and time. She exhibits normal muscle tone.  Skin: Skin is warm and dry.  Psychiatric: She has a normal mood and affect. Her behavior is normal.  Nursing note and vitals  reviewed.    ED Treatments / Results  Labs (all labs ordered are listed, but only abnormal results are displayed) Labs Reviewed  BASIC METABOLIC PANEL - Abnormal; Notable for the following:       Result Value   Chloride 99 (*)    CO2 34 (*)    Glucose, Bld 128 (*)    BUN 26 (*)    GFR calc non Af Amer 48 (*)    GFR calc Af Amer 56 (*)    All other components within normal limits  CBC WITH DIFFERENTIAL/PLATELET - Abnormal; Notable for the following:    RDW 15.6 (*)    All other components within normal limits  URINALYSIS, ROUTINE W REFLEX MICROSCOPIC (NOT AT [facility]) - Abnormal; Notable for the following:    APPearance CLOUDY (*)    Hgb urine dipstick MODERATE (*)    Protein, ur 100 (*)    All other components within  normal limits  BRAIN NATRIURETIC PEPTIDE - Abnormal; Notable for the following:    B Natriuretic Peptide 243.7 (*)    All other components within normal limits  D-DIMER, QUANTITATIVE (NOT AT [facility]) - Abnormal; Notable for the following:    D-Dimer, Quant 3.53 (*)    All other components within normal limits  URINE MICROSCOPIC-ADD ON - Abnormal; Notable for the following:    Squamous Epithelial / LPF 0-5 (*)    Bacteria, UA FEW (*)    All other components within normal limits    EKG  EKG Interpretation  Date/Time:  Thursday January 16 2016 07:26:27 EDT Ventricular Rate:  89 PR Interval:    QRS Duration: 91 QT Interval:  365 QTC Calculation: 445 R Axis:   -39 Text Interpretation:  Atrial fibrillation Multiple ventricular premature complexes Left axis deviation Probable anterior infarct, age indeterminate Lateral leads are also involved since last tracing no significant change Confirmed by Effie Shy  MD, Calena Salem [phone]) on 01/16/2016 7:33:51 AM Also confirmed by Effie Shy  MD, Deklynn Charlet [phone]), editor Whitney Post, Cala Bradford [phone])  on 01/16/2016 8:15:38 AM       Radiology Dg Chest 2 View  Result Date: 01/16/2016 CLINICAL DATA:  Short of breath EXAM: CHEST  2 VIEW COMPARISON:   12/31/2015 FINDINGS: Moderate to large pleural effusions bilaterally appear unchanged. Bibasilar atelectasis also unchanged. Negative for pulmonary edema. Compression fracture at approximately L1 appears to have progressed however this could be due to projection. Correlate with pain in this area. IMPRESSION: No change from the prior study. Small to moderate bilateral effusions with bibasilar atelectasis. No edema. Possible chronic congestive heart failure. Moderate to severe compression fracture approximately L1 appears to have progressed in the interval. Electronically Signed   By: Marlan Palau M.D.   On: 01/16/2016 08:05   Ct Angio Chest Pe W/cm &/or Wo Cm  Result Date: 01/16/2016 CLINICAL DATA:  Shortness of breath and productive cough. EXAM: CT ANGIOGRAPHY CHEST WITH CONTRAST TECHNIQUE: Multidetector CT imaging of the chest was performed using the standard protocol during bolus administration of intravenous contrast. Multiplanar CT image reconstructions and MIPs were obtained to evaluate the vascular anatomy. CONTRAST:  75 cc of Isovue 370 COMPARISON:  3/26/9 FINDINGS: Mediastinum/Lymph Nodes: Mild cardiac enlargement. There is aortic atherosclerosis identified. The trachea is patent and midline. Normal appearance of the esophagus. No mediastinal or hilar adenopathy. The main pulmonary artery is patent. No lobar or segmental pulmonary artery filling defects identified. Lungs/Pleura: Moderate bilateral pleural effusions are identified. There is mild interstitial edema. Nodule in the right lower lobe measures 8 mm, image 42 of series 7. Unchanged from previous exam and likely benign. There is a nodule within the superior segment of left lower lobe which measures 5 mm. Upper abdomen: There is no focal liver abnormality. The visualized portions of the spleen appear normal. Musculoskeletal: Mild degenerative disc disease is noted within the thoracic spine. Review of the MIP images confirms the above findings.  IMPRESSION: 1. No evidence for acute pulmonary embolus. 2. Cardiac enlargement, aortic atherosclerosis, pulmonary edema and bilateral pleural effusions. Findings are compatible with congestive heart failure. 3. New pulmonary nodule in the left lower lobe measures 5 mm. No follow-up needed if patient is low-risk (and has no known or suspected primary neoplasm). Non-contrast chest CT can be considered in 12 months if patient is high-risk. This recommendation follows the consensus statement: Guidelines for Management of Incidental Pulmonary Nodules Detected on CT Images:From the Fleischner Society 2017; published online before print (10.1148/radiol.6962952841). Electronically Signed  By: Signa Kell M.D.   On: 01/16/2016 12:11    Procedures Procedures (including critical care time)  Medications Ordered in ED Medications  ipratropium-albuterol (DUONEB) 0.5-2.5 (3) MG/3ML nebulizer solution 3 mL (3 mLs Nebulization Given 01/16/16 0818)  iopamidol (ISOVUE-370) 76 % injection 100 mL (75 mLs Intravenous Contrast Given 01/16/16 1132)     Initial Impression / Assessment and Plan / ED Course  I have reviewed the triage vital signs and the nursing notes.  Pertinent labs & imaging results that were available during my care of the patient were reviewed by me and considered in my medical decision making (see chart for details).  Clinical Course    Medications  ipratropium-albuterol (DUONEB) 0.5-2.5 (3) MG/3ML nebulizer solution 3 mL (3 mLs Nebulization Given 01/16/16 0818)  iopamidol (ISOVUE-370) 76 % injection 100 mL (75 mLs Intravenous Contrast Given 01/16/16 1132)    Patient Vitals for the past 24 hrs:  BP Temp Temp src Pulse Resp SpO2  01/16/16 1411 (!) 120/105 - - 80 20 98 %  01/16/16 1100 123/61 - - 78 25 97 %  01/16/16 1030 (!) 130/52 - - 88 26 94 %  01/16/16 1000 125/67 - - 89 (!) 29 92 %  01/16/16 0930 139/66 - - 89 (!) 32 90 %  01/16/16 0830 137/68 - - 88 (!) 30 92 %  01/16/16 0819  142/67 - - 88 25 94 %  01/16/16 0800 142/67 - - 84 (!) 30 94 %  01/16/16 0730 134/84 - - 81 (!) 27 95 %  01/16/16 0728 157/72 - - 85 (!) 28 95 %  01/16/16 0711 157/72 97.9 F (36.6 C) Oral 75 16 -    2:14 PM Reevaluation with update and discussion. After initial assessment and treatment, an updated evaluation reveals No further complaints, she remains comfortable, with an occasional cough. Terianna Peggs L    Final Clinical Impressions(s) / ED Diagnoses   Final diagnoses:  Dyspnea    Nonspecific cough, ongoing for at least 3 weeks. No evidence for pneumonia, PE or serious bacterial infection. Suspect mild ongoing congestive heart failure. Will increase Lasix transiently, 3 days, taking twice a day instead of once, and have her follow with her PCP for a checkup. Doubt ACS, or impending vascular collapse.  Nursing Notes Reviewed/ Care Coordinated Applicable Imaging Reviewed Interpretation of Laboratory Data incorporated into ED treatment  The patient appears reasonably screened and/or stabilized for discharge and I doubt any other medical condition or other [facility] requiring further screening, evaluation, or treatment in the ED at this time prior to discharge.  Plan: Home Medications- continue; Home Treatments- rest; return here if the recommended treatment, does not improve the symptoms; Recommended follow up- PCP prn   New Prescriptions New Prescriptions   No medications on file     Mancel Bale, MD 01/16/16 1415

## 2016-01-16 NOTE — ED Notes (Signed)
Pt given meal tray.

## 2016-01-16 NOTE — ED Triage Notes (Signed)
Pt comes from home via EMS with complaints of SOB.  Woke up this morning and had a productive cough (dark green) with SOB.  10 mg of albuterol and .5 of atrovent and 125 mg of solumedrol given in route.  Diminished lung sounds with ronchi heard before administration per EMS. [place] use at home at 2L. Hx of COPD and CHF and was recently treated for a UTI.

## 2016-01-16 NOTE — ED Notes (Signed)
Bed: [facility] Expected date:  Expected time:  Means of arrival:  Comments: 

## 2016-02-05 ENCOUNTER — Encounter: Payer: Self-pay | Admitting: Cardiology

## 2016-02-14 NOTE — Progress Notes (Signed)
HPI: FU atrial fibrillation. Syncope 2009. Myovue on September 15, 2007 showed no ischemia or infarction, and her ejection fraction was 78%. She also had a CardioNet monitor. There was atrial fibrillation noted which was chronic, but there were no significant pauses. Echocardiogram 9/15 showed normal LV function; restrictive filling, mild biatrial enlargement, mild MR, secundum ASD. Seen in ER twice recently with dyspnea. CTA 8/17 showed no pulmonary embolus; pulmonary edema and effusions; LLL nodule. Lasix increased. Since last seen, she does have some dyspnea on exertion, orthopnea. No chest pain. She has minimal pedal edema. Of note she states she was placed on steroids for her dyspnea with marked improvement.  Current Outpatient Prescriptions  Medication Sig Dispense Refill  . albuterol (VENTOLIN HFA) 108 (90 Base) MCG/ACT inhaler Inhale 1-2 puffs into the lungs 4 (four) times daily as needed for wheezing or shortness of breath. 8 g 0  . cetirizine (ZYRTEC) 10 MG tablet Take 10 mg by mouth daily.    . digoxin (LANOXIN) 0.125 MG tablet Take 125 mcg by mouth daily.  0  . diltiazem (DILACOR XR) 120 MG 24 hr capsule Take 1 capsule (120 mg total) by mouth daily. 30 capsule 0  . feeding supplement, ENSURE ENLIVE, (ENSURE ENLIVE) LIQD Take 237 mLs by mouth 2 (two) times daily between meals. (Patient taking differently: Take 237 mLs by mouth daily. ) 237 mL 12  . fluticasone (FLONASE) 50 MCG/ACT nasal spray Place 1-2 sprays into both nostrils daily as needed for allergies.     . fluticasone furoate-vilanterol (BREO ELLIPTA) 200-25 MCG/INH AEPB Inhale 1 puff into the lungs daily as needed (Shortness of Breath). 1 each 0  . furosemide (LASIX) 20 MG tablet Take 1 tablet (20 mg total) by mouth daily. 30 tablet 0  . meloxicam (MOBIC) 7.5 MG tablet Take 7.5 mg by mouth daily as needed for pain.     . Multiple Vitamin (MULTIVITAMIN) tablet Take 2 tablets by mouth daily.     Marland Kitchen warfarin (COUMADIN) 2 MG tablet  Take 2 mg by mouth as directed. Take 2mg  five days a week.. Tuesday, Wednesday, Thursday, Saturday and Sunday    . warfarin (COUMADIN) 2.5 MG tablet Take 1 tablet (2.5 mg total) by mouth See admin instructions. Take 2.5mg  daily on Sun,Tues,Wed,Thurs,Sat (Patient not taking: Reported on 12/31/2015) 30 tablet 0   No current facility-administered medications for this visit.      Past Medical History:  Diagnosis Date  . Arthritis    "back" (02/12/2014)  . Atrial fibrillation (HCC)   . Bronchiectasis    History of  . COPD (chronic obstructive pulmonary disease) (HCC)   . Drug therapy    coumadin  . Hypertension   . On home oxygen therapy    "prn for the last week" (02/12/2014)  . Pneumonia "lots of times"  . Syncope     Past Surgical History:  Procedure Laterality Date  . CATARACT EXTRACTION, BILATERAL Bilateral   . CESAREAN SECTION  1958  . FEMUR FRACTURE SURGERY Right    Following a trauma  . FRACTURE SURGERY    . HIP FRACTURE SURGERY Right    pt denies this hx on 02/12/2014 "It was my leg, not my hip"  . KNEE ARTHROCENTESIS Left 07/2007   and injection  . TONSILLECTOMY      Social History   Social History  . Marital status: Married    Spouse name: N/A  . Number of children: N/A  . Years of education: N/A  Occupational History  . 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 Smoking status: Never Smoker  . Smokeless tobacco: Never Used  . Alcohol use No  . Drug use: No  . Sexual activity: Not on file   Other Topics Concern  . Not on file   Social History Narrative   Married    Family History  Problem Relation Age of Onset  . Heart attack Mother [id number]  . Pneumonia Father     ROS: no fevers or chills, productive cough, hemoptysis, dysphasia, odynophagia, melena, hematochezia, dysuria, hematuria, rash, seizure activity, orthopnea, PND, pedal edema, claudication. Remaining systems are negative.  Physical Exam: Well-developed frail in no acute  distress.  Skin is warm and dry.  HEENT is normal.  Neck is supple.  Chest is clear to auscultation with normal expansion.  Cardiovascular exam is irregular Abdominal exam nontender or distended. No masses palpated. Extremities show trace edema. neuro grossly intact  A/P  1 hypertension-blood pressure controlled. Continue present medications.  2 atrial fibrillation-continue Cardizem and digoxin for rate control. Continue Coumadin.  3 secundum ASD-conservative measures given age.  4 acute on chronic diastolic congestive heart failure-recent CT showed edema and effusions. She had diuretics increased. However she states prednisone improved her symptoms more than anything else. She does not appear to be significantly volume overloaded on examination. Continue present dose of diuretic. Check potassium, renal function and BNP. Further adjustment based on results.   Olga MillersBrian Jacobo Moncrief, MD

## 2016-02-19 ENCOUNTER — Encounter: Payer: Self-pay | Admitting: Cardiology

## 2016-02-19 ENCOUNTER — Ambulatory Visit (INDEPENDENT_AMBULATORY_CARE_PROVIDER_SITE_OTHER): Payer: Medicare HMO | Admitting: Cardiology

## 2016-02-19 VITALS — BP 150/70 | Ht 61.0 in | Wt 105.0 lb

## 2016-02-19 DIAGNOSIS — I482 Chronic atrial fibrillation, unspecified: Secondary | ICD-10-CM

## 2016-02-19 DIAGNOSIS — Q211 Atrial septal defect: Secondary | ICD-10-CM

## 2016-02-19 DIAGNOSIS — Q2111 Secundum atrial septal defect: Secondary | ICD-10-CM

## 2016-02-19 DIAGNOSIS — I5033 Acute on chronic diastolic (congestive) heart failure: Secondary | ICD-10-CM | POA: Diagnosis not present

## 2016-02-19 LAB — BASIC METABOLIC PANEL
BUN: 23 mg/dL (ref 7–25)
CALCIUM: 8.7 mg/dL (ref 8.6–10.4)
CO2: 37 mmol/L — AB (ref 20–31)
CREATININE: 1.05 mg/dL — AB (ref 0.60–0.88)
Chloride: 97 mmol/L — ABNORMAL LOW (ref 98–110)
Glucose, Bld: 89 mg/dL (ref 65–99)
Potassium: 4.4 mmol/L (ref 3.5–5.3)
SODIUM: 142 mmol/L (ref 135–146)

## 2016-02-19 NOTE — Patient Instructions (Signed)
Medication Instructions:   NO CHANGE  Labwork:  Your physician recommends that you HAVE LAB WORK TODAY  Follow-Up:  Your physician recommends that you schedule a follow-up appointment in: 8 WEEKS WITH DR CRENSHAW      

## 2016-02-20 LAB — BRAIN NATRIURETIC PEPTIDE: BRAIN NATRIURETIC PEPTIDE: 263.9 pg/mL — AB (ref <100)

## 2016-04-20 NOTE — Progress Notes (Signed)
HPI: FU atrial fibrillation. Syncope 2009. Myovue on September 15, 2007 showed no ischemia or infarction, and her ejection fraction was 78%. She also had a CardioNet monitor. There was atrial fibrillation noted which was chronic, but there were no significant pauses. Echocardiogram 9/15 showed normal LV function; restrictive filling, mild biatrial enlargement, mild MR, secundum ASD. CTA 8/17 showed no pulmonary embolus; pulmonary edema and effusions; LLL nodule. Lasix increased. Since last seen, she is improved. She denies dyspnea, chest pain, palpitations, syncope or bleeding.   Current Outpatient Prescriptions  Medication Sig Dispense Refill  . albuterol (VENTOLIN HFA) 108 (90 Base) MCG/ACT inhaler Inhale 1-2 puffs into the lungs 4 (four) times daily as needed for wheezing or shortness of breath. 8 g 0  . cetirizine (ZYRTEC) 10 MG tablet Take 10 mg by mouth daily.    . digoxin (LANOXIN) 0.125 MG tablet Take 125 mcg by mouth daily.  0  . diltiazem (DILACOR XR) 120 MG 24 hr capsule Take 1 capsule (120 mg total) by mouth daily. 30 capsule 0  . feeding supplement, ENSURE ENLIVE, (ENSURE ENLIVE) LIQD Take 237 mLs by mouth 2 (two) times daily between meals. (Patient taking differently: Take 237 mLs by mouth daily. ) 237 mL 12  . fluticasone (FLONASE) 50 MCG/ACT nasal spray Place 1-2 sprays into both nostrils daily as needed for allergies.     . fluticasone furoate-vilanterol (BREO ELLIPTA) 200-25 MCG/INH AEPB Inhale 1 puff into the lungs daily as needed (Shortness of Breath). 1 each 0  . furosemide (LASIX) 20 MG tablet Take 1 tablet (20 mg total) by mouth daily. 30 tablet 0  . meloxicam (MOBIC) 7.5 MG tablet Take 7.5 mg by mouth daily as needed for pain.     . Multiple Vitamin (MULTIVITAMIN) tablet Take 2 tablets by mouth daily.     Marland Kitchen. warfarin (COUMADIN) 2 MG tablet Take 2 mg by mouth as directed. Take 2mg  five days a week.. Tuesday, Wednesday, Thursday, Saturday and Sunday     No current  facility-administered medications for this visit.      Past Medical History:  Diagnosis Date  . Arthritis    "back" (02/12/2014)  . Atrial fibrillation (HCC)   . Bronchiectasis    History of  . COPD (chronic obstructive pulmonary disease) (HCC)   . Drug therapy    coumadin  . Hypertension   . On home oxygen therapy    "prn for the last week" (02/12/2014)  . Pneumonia "lots of times"  . Syncope     Past Surgical History:  Procedure Laterality Date  . CATARACT EXTRACTION, BILATERAL Bilateral   . CESAREAN SECTION  1958  . FEMUR FRACTURE SURGERY Right    Following a trauma  . FRACTURE SURGERY    . HIP FRACTURE SURGERY Right    pt denies this hx on 02/12/2014 "It was my leg, not my hip"  . KNEE ARTHROCENTESIS Left 07/2007   and injection  . TONSILLECTOMY      Social History   Social History  . Marital status: Married    Spouse name: N/A  . Number of children: N/A  . Years of education: N/A   Occupational History  . 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 Smoking status: Never Smoker  . Smokeless tobacco: Never Used  . Alcohol use No  . Drug use: No  . Sexual activity: Not on file   Other Topics Concern  . Not on file  Social History Narrative   Married    Family History  Problem Relation Age of Onset  . Heart attack Mother [phone]  . Pneumonia Father     ROS: no fevers or chills, productive cough, hemoptysis, dysphasia, odynophagia, melena, hematochezia, dysuria, hematuria, rash, seizure activity, orthopnea, PND, pedal edema, claudication. Remaining systems are negative.  Physical Exam: Well-developed frail in no acute distress.  Skin is warm and dry.  HEENT is normal.  Neck is supple.  Chest is clear to auscultation with normal expansion.  Cardiovascular exam is regular rate and rhythm.  Abdominal exam nontender or distended. No masses palpated. Extremities show no edema. neuro grossly intact  A/P  1 Hypertension-blood  pressure is reasonable. Continue present medications and follow. Check K and renal function.  2 atrial fibrillation-continue Cardizem and digoxin for rate control. Continue Coumadin. Check digoxin level.  3 secundum ASD-conservative measures given patient's age.  4 chronic diastolic congestive heart failure-much improved. Continue present dose of diuretic. Check potassium and renal function.  Olga MillersBrian Crenshaw, MD

## 2016-04-23 ENCOUNTER — Encounter: Payer: Self-pay | Admitting: Cardiology

## 2016-04-23 ENCOUNTER — Ambulatory Visit (INDEPENDENT_AMBULATORY_CARE_PROVIDER_SITE_OTHER): Payer: Medicare HMO | Admitting: Cardiology

## 2016-04-23 VITALS — BP 146/68 | HR 84 | Ht 61.0 in | Wt 111.0 lb

## 2016-04-23 DIAGNOSIS — I482 Chronic atrial fibrillation, unspecified: Secondary | ICD-10-CM

## 2016-04-23 DIAGNOSIS — I10 Essential (primary) hypertension: Secondary | ICD-10-CM

## 2016-04-23 DIAGNOSIS — I4891 Unspecified atrial fibrillation: Secondary | ICD-10-CM

## 2016-04-23 DIAGNOSIS — Q211 Atrial septal defect: Secondary | ICD-10-CM

## 2016-04-23 DIAGNOSIS — I5032 Chronic diastolic (congestive) heart failure: Secondary | ICD-10-CM

## 2016-04-23 DIAGNOSIS — Q2111 Secundum atrial septal defect: Secondary | ICD-10-CM

## 2016-04-23 NOTE — Patient Instructions (Signed)
Medication Instructions:   NO CHANGE  Labwork:  Your physician recommends that you return for lab work PRIOR TO TAKING MEDICATIONS IN THE MORNING  Follow-Up:  Your physician wants you to follow-up in: 6 MONTHS WITH DR Jens SomRENSHAW You will receive a reminder letter in the mail two months in advance. If you don't receive a letter, please call our office to schedule the follow-up appointment.   If you need a refill on your cardiac medications before your next appointment, please call your pharmacy.

## 2016-05-22 ENCOUNTER — Telehealth: Payer: Self-pay | Admitting: *Deleted

## 2016-05-22 MED ORDER — DIGOXIN 125 MCG PO TABS: 125.0000 ug | ORAL_TABLET | ORAL | 0 refills | Status: AC

## 2016-05-22 NOTE — Telephone Encounter (Signed)
Spoke with pt dtr, labs from dr wilson's office reviewed and per dr Jens Somcrenshaw patient to decrease digoxin to 0.125 mg every other day. dtr voiced understanding and repeated the change.

## 2017-06-15 ENCOUNTER — Ambulatory Visit
Admission: RE | Admit: 2017-06-15 | Discharge: 2017-06-15 | Disposition: A | Discharge status: Home / Self Care | Payer: BLUE CROSS/BLUE SHIELD | Source: Ambulatory Visit | Attending: Family Medicine | Admitting: Family Medicine

## 2017-06-15 ENCOUNTER — Other Ambulatory Visit: Payer: Self-pay | Admitting: Family Medicine

## 2017-06-15 DIAGNOSIS — R05 Cough: Secondary | ICD-10-CM

## 2017-06-15 DIAGNOSIS — R059 Cough, unspecified: Secondary | ICD-10-CM

## 2017-06-20 ENCOUNTER — Emergency Department (HOSPITAL_COMMUNITY)
Admission: EM | Admit: 2017-06-20 | Discharge: 2017-06-20 | Disposition: A | Discharge status: Home / Self Care | Payer: Medicare Other | Attending: Emergency Medicine | Admitting: Emergency Medicine

## 2017-06-20 ENCOUNTER — Encounter (HOSPITAL_COMMUNITY): Payer: Self-pay

## 2017-06-20 ENCOUNTER — Other Ambulatory Visit: Payer: Self-pay

## 2017-06-20 ENCOUNTER — Emergency Department (HOSPITAL_COMMUNITY): Payer: Medicare Other

## 2017-06-20 DIAGNOSIS — Z79899 Other long term (current) drug therapy: Secondary | ICD-10-CM | POA: Insufficient documentation

## 2017-06-20 DIAGNOSIS — I5032 Chronic diastolic (congestive) heart failure: Secondary | ICD-10-CM | POA: Insufficient documentation

## 2017-06-20 DIAGNOSIS — M545 Low back pain: Secondary | ICD-10-CM | POA: Diagnosis not present

## 2017-06-20 DIAGNOSIS — W010XXA Fall on same level from slipping, tripping and stumbling without subsequent striking against object, initial encounter: Secondary | ICD-10-CM | POA: Diagnosis not present

## 2017-06-20 DIAGNOSIS — S3210XA Unspecified fracture of sacrum, initial encounter for closed fracture: Secondary | ICD-10-CM | POA: Insufficient documentation

## 2017-06-20 DIAGNOSIS — Y929 Unspecified place or not applicable: Secondary | ICD-10-CM | POA: Diagnosis not present

## 2017-06-20 DIAGNOSIS — Y999 Unspecified external cause status: Secondary | ICD-10-CM | POA: Insufficient documentation

## 2017-06-20 DIAGNOSIS — N183 Chronic kidney disease, stage 3 (moderate): Secondary | ICD-10-CM | POA: Diagnosis not present

## 2017-06-20 DIAGNOSIS — Y9389 Activity, other specified: Secondary | ICD-10-CM | POA: Diagnosis not present

## 2017-06-20 DIAGNOSIS — J449 Chronic obstructive pulmonary disease, unspecified: Secondary | ICD-10-CM | POA: Insufficient documentation

## 2017-06-20 DIAGNOSIS — I130 Hypertensive heart and chronic kidney disease with heart failure and stage 1 through stage 4 chronic kidney disease, or unspecified chronic kidney disease: Secondary | ICD-10-CM | POA: Insufficient documentation

## 2017-06-20 DIAGNOSIS — S34139A Unspecified injury to sacral spinal cord, initial encounter: Secondary | ICD-10-CM | POA: Diagnosis present

## 2017-06-20 DIAGNOSIS — Z7901 Long term (current) use of anticoagulants: Secondary | ICD-10-CM | POA: Insufficient documentation

## 2017-06-20 LAB — BASIC METABOLIC PANEL
ANION GAP: 6 (ref 5–15)
BUN: 25 mg/dL — ABNORMAL HIGH (ref 6–20)
CALCIUM: 8.4 mg/dL — AB (ref 8.9–10.3)
CHLORIDE: 98 mmol/L — AB (ref 101–111)
CO2: 33 mmol/L — AB (ref 22–32)
Creatinine, Ser: 1.23 mg/dL — ABNORMAL HIGH (ref 0.44–1.00)
GFR calc Af Amer: 42 mL/min — ABNORMAL LOW (ref >60)
GFR calc non Af Amer: 36 mL/min — ABNORMAL LOW (ref >60)
Glucose, Bld: 185 mg/dL — ABNORMAL HIGH (ref 65–99)
Potassium: 4.2 mmol/L (ref 3.5–5.1)
Sodium: 137 mmol/L (ref 135–145)

## 2017-06-20 LAB — CBC
HEMATOCRIT: 41.4 % (ref 36.0–46.0)
HEMOGLOBIN: 13.3 g/dL (ref 12.0–15.0)
MCH: 31.4 pg (ref 26.0–34.0)
MCHC: 32.1 g/dL (ref 30.0–36.0)
MCV: 97.9 fL (ref 78.0–100.0)
Platelets: 157 10*3/uL (ref 150–400)
RBC: 4.23 MIL/uL (ref 3.87–5.11)
RDW: 16.4 % — ABNORMAL HIGH (ref 11.5–15.5)
WBC: 14.3 10*3/uL — AB (ref 4.0–10.5)

## 2017-06-20 LAB — PROTIME-INR
INR: 2.37
Prothrombin Time: 25.7 seconds — ABNORMAL HIGH (ref 11.4–15.2)

## 2017-06-20 MED ORDER — TRAMADOL HCL 50 MG PO TABS: 50.0000 mg | ORAL_TABLET | Freq: Four times a day (QID) | ORAL | 0 refills | Status: DC | PRN

## 2017-06-20 MED ORDER — ACETAMINOPHEN 325 MG PO TABS
650.0000 mg | ORAL_TABLET | Freq: Once | ORAL | Status: AC
  Administered 2017-06-20: 650 mg via ORAL
  Filled 2017-06-20: qty 2

## 2017-06-20 NOTE — Discharge Instructions (Signed)
I have placed home health orders for physical therapy and home health assistance. They will likely contact you today.  Use ice and tylenol for pain control . Use over the counter colace to soften stool. Contact a health care provider if: Your pain becomes worse. Your bowel movements cause a great deal of discomfort. You are unable to have a bowel movement. You have uncontrolled urine loss (urinary incontinence). You have a fever.

## 2017-06-20 NOTE — ED Provider Notes (Signed)
[facility]-EMERGENCY DEPT Provider Note   CSN: [id number] Arrival date & time: 06/20/17  0208     History   Chief Complaint Chief Complaint  Patient presents with  . Fall    HPI Candice Rangel is a 84 y.o. female who presents the emergency department after fall.  Her daughter is with her.  She states that she was turning down her mother's bed when she fell backward onto a plastic laundry hamper full of close and then rolled to the floor.  She did not hit her head or lose consciousness.  She did have a small skin tear on the left forearm which was cleaned and wrapped by EMS.  She complains of pain in her low back area.  She was able to stand and ambulate with assistance.  She denies any weakness or numbness in her lower extremities.  Patient is anticoagulated on Coumadin.  HPI  Past Medical History:  Diagnosis Date  . Arthritis    "back" (02/12/2014)  . Atrial fibrillation (HCC)   . Bronchiectasis    History of  . COPD (chronic obstructive pulmonary disease) (HCC)   . Drug therapy    coumadin  . Hypertension   . On home oxygen therapy    "prn for the last week" (02/12/2014)  . Pneumonia "lots of times"  . Syncope     Patient Active Problem List   Diagnosis Date Noted  . Gout flare 09/06/2015  . Protein-calorie malnutrition, severe 09/05/2015  . Supratherapeutic INR 09/04/2015  . Weakness 02/12/2014  . Elevated troponin 02/12/2014  . Left leg weakness 02/12/2014  . Interstitial lung disease (HCC) 02/08/2014  . Chronic respiratory failure with hypoxia (HCC) 02/08/2014  . CKD (chronic kidney disease), stage III (HCC) 02/06/2014  . Acute on chronic diastolic CHF (congestive heart failure) (HCC) 02/05/2014  . CHF (congestive heart failure), NYHA class II (HCC) 02/05/2014  . 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 Dyspnea 05/25/2012  . PATENT FORAMEN OVALE 03/27/2009  . Essential hypertension 03/25/2009  . Atrial fibrillation (HCC) 03/25/2009  . SYNCOPE  03/25/2009    Past Surgical History:  Procedure Laterality Date  . CATARACT EXTRACTION, BILATERAL Bilateral   . CESAREAN SECTION  1958  . FEMUR FRACTURE SURGERY Right    Following a trauma  . FRACTURE SURGERY    . HIP FRACTURE SURGERY Right    pt denies this hx on 02/12/2014 "It was my leg, not my hip"  . KNEE ARTHROCENTESIS Left 07/2007   and injection  . TONSILLECTOMY      OB History    No data available       Home Medications    Prior to Admission medications   Medication Sig Start Date End Date Taking? Authorizing Provider  albuterol (VENTOLIN HFA) 108 (90 Base) MCG/ACT inhaler Inhale 1-2 puffs into the lungs 4 (four) times daily as needed for wheezing or shortness of breath. 01/16/16  Yes Mancel Bale, MD  cetirizine (ZYRTEC) 10 MG tablet Take 10 mg by mouth daily.   Yes [provider]  digoxin (LANOXIN) 0.125 MG tablet Take 1 tablet (125 mcg total) by mouth every other day. Patient taking differently: Take 125 mcg by mouth daily.  05/22/16  Yes Lewayne Bunting, MD  diltiazem (DILACOR XR) 120 MG 24 hr capsule Take 1 capsule (120 mg total) by mouth daily. 02/08/14  Yes Short, Thea Silversmith, MD  feeding supplement, ENSURE ENLIVE, (ENSURE ENLIVE) LIQD Take 237 mLs by mouth 2 (two) times daily between meals. Patient taking  differently: Take 237 mLs by mouth daily.  09/06/15  Yes Rodolph Bong, MD  fluticasone [facility]) 50 MCG/ACT nasal spray Place 1-2 sprays into both nostrils daily as needed for allergies.  05/30/15  Yes [provider]  fluticasone furoate-vilanterol (BREO ELLIPTA) 200-25 MCG/INH AEPB Inhale 1 puff into the lungs daily as needed (Shortness of Breath). 01/16/16  Yes Mancel Bale, MD  furosemide (LASIX) 20 MG tablet Take 1 tablet (20 mg total) by mouth daily. 02/14/14  Yes Regalado, Belkys A, MD  meloxicam (MOBIC) 7.5 MG tablet Take 7.5 mg by mouth daily as needed for pain.  08/14/15  Yes [provider]  Multiple Vitamin (MULTIVITAMIN)  tablet Take 2 tablets by mouth daily.    Yes [provider]  OXYGEN Inhale 2.5 L into the lungs continuous.   Yes [provider]  predniSONE (DELTASONE) 10 MG tablet Take 10 mg by mouth daily with breakfast.   Yes [provider]  warfarin (COUMADIN) 2 MG tablet Take 2 mg by mouth as directed. Take 2 mg on Monday and Friday, take 1 mg on all other days 11/22/15  Yes [provider]  traMADol (ULTRAM) 50 MG tablet Take 1 tablet (50 mg total) by mouth every 6 (six) hours as needed. 06/20/17   Arthor Captain, PA-C    Family History Family History  Problem Relation Age of Onset  . Heart attack Mother 23  . Pneumonia Father     Social History Social History   Tobacco Use  . Smoking status: Never Smoker  . Smokeless tobacco: Never Used  Substance Use Topics  . Alcohol use: No  . Drug use: No     Allergies   Cephalexin; Other; and Penicillins   Review of Systems Review of Systems  Ten systems reviewed and are negative for acute change, except as noted in the HPI.   Physical Exam Updated Vital Signs BP (!) 179/88   Pulse 66   Temp 97.9 F (36.6 C) (Oral)   Resp 18   Ht 5\' 1"  (1.549 m)   Wt 50.3 kg (111 lb)   SpO2 95%   BMI 20.97 kg/m   Physical Exam  Constitutional: She is oriented to person, place, and time. She appears well-developed and well-nourished. No distress.  HENT:  Head: Normocephalic and atraumatic.  Eyes: Conjunctivae are normal. No scleral icterus.  Neck: Normal range of motion.  Cardiovascular: Normal rate, regular rhythm and normal heart sounds. Exam reveals no gallop and no friction rub.  No murmur heard. Pulmonary/Chest: Effort normal and breath sounds normal. No respiratory distress.  Abdominal: Soft. Bowel sounds are normal. She exhibits no distension and no mass. There is no tenderness. There is no guarding.  Musculoskeletal:  No pain with range of motion of the hips knees or ankles.  No midline spinal  tenderness, tender to palpation over the sacrum.  Neurological: She is alert and oriented to person, place, and time.  Skin: Skin is warm and dry. She is not diaphoretic.  Psychiatric: Her behavior is normal.  Nursing note and vitals reviewed.    ED Treatments / Results  Labs (all labs ordered are listed, but only abnormal results are displayed) Labs Reviewed  PROTIME-INR - Abnormal; Notable for the following components:      Result Value   Prothrombin Time 25.7 (*)    All other components within normal limits  CBC - Abnormal; Notable for the following components:   WBC 14.3 (*)    RDW 16.4 (*)  All other components within normal limits  BASIC METABOLIC PANEL - Abnormal; Notable for the following components:   Chloride 98 (*)    CO2 33 (*)    Glucose, Bld 185 (*)    BUN 25 (*)    Creatinine, Ser 1.23 (*)    Calcium 8.4 (*)    GFR calc non Af Amer 36 (*)    GFR calc Af Amer 42 (*)    All other components within normal limits    EKG  EKG Interpretation None       Radiology Dg Lumbar Spine Complete  Result Date: 06/20/2017 CLINICAL DATA:  Low back pain and sacral pain after a fall this morning. EXAM: LUMBAR SPINE - COMPLETE 4+ VIEW COMPARISON:  02/13/2014 FINDINGS: Lumbar scoliosis convex towards the right and unchanged since previous study. Diffuse degenerative changes throughout the lumbar spine with narrowed interspaces and endplate hypertrophic changes. Degenerative changes throughout the facet joints. Mild anterior subluxation of L4 on L5 without change. Anterior compression of the L1 vertebra and of the superior endplate of T12 without change. No acute compression or cortical changes are demonstrated. No destructive or expansile bone lesions. Normal alignment of the facet joints. Calcification of the aorta. IMPRESSION: Diffuse degenerative changes throughout the lumbar spine. Scoliosis convex towards the right. Mild anterior subluxation at L4-5 is likely degenerative and  unchanged. Anterior compression of L1 and compression of the superior endplate of T12 are also unchanged since prior study. No acute process identified. Electronically Signed   By: Burman NievesWilliam  Stevens M.D.   On: 06/20/2017 03:41   Dg Sacrum/coccyx  Result Date: 06/20/2017 CLINICAL DATA:  Low back pain and sacral pain after a fall this morning. EXAM: SACRUM AND COCCYX - 2+ VIEW COMPARISON:  Lumbar spine 02/13/2014 FINDINGS: Visualization is limited due to overlying bowel gas and stool. There is anterior angulation of the mid coccyx with suggestion of cortical irregularity. This may represent acute fracture. No destructive bone lesion or expansile changes. Sacral struts appear intact. SI joints are not displaced. Postoperative changes in the right femur. IMPRESSION: Anterior angulation of the mid coccyx with suggestion of cortical irregularities suggesting an acute fracture. Electronically Signed   By: Burman NievesWilliam  Stevens M.D.   On: 06/20/2017 03:33    Procedures Procedures (including critical care time)  Medications Ordered in ED Medications  acetaminophen (TYLENOL) tablet 650 mg (650 mg Oral Given 06/20/17 0328)     Initial Impression / Assessment and Plan / ED Course  I have reviewed the triage vital signs and the nursing notes.  Pertinent labs & imaging results that were available during my care of the patient were reviewed by me and considered in my medical decision making (see chart for details).     Reviewed the patient's labs and imaging.  She is therapeutic on her INR.  Patient is able to ambulate but has some difficulty transitioning from sitting to standing secondary to her acute sacral fracture.  Patient be discharged with tramadol.  I have placed home health orders and PT.  Case management consulted for follow-up.  Patient appears appropriate for discharge at this time.  Final Clinical Impressions(s) / ED Diagnoses   Final diagnoses:  Closed fracture of sacrum, unspecified portion of  sacrum, initial encounter [facility](HCC)    ED Discharge Orders        Ordered    traMADol (ULTRAM) 50 MG tablet  Every 6 hours PRN     06/20/17 0703       Arthor CaptainHarris, Madisyn Mawhinney, PA-C  06/20/17 0758    Molpus, Jonny Ruiz, MD 06/20/17 [phone]

## 2017-06-20 NOTE — ED Notes (Signed)
Bed: [id number] Expected date:  Expected time:  Means of arrival:  Comments: [age] fall

## 2017-06-20 NOTE — ED Notes (Signed)
Pt walked with walker. Pt in a lot of pain while getting out of bed but seemed to tolerate better once standing/walking.

## 2017-06-20 NOTE — ED Triage Notes (Signed)
States fall in bathroom tripped and fell at home and landed in laundry basket no LOC lower back pain with abrasion noted no other complaints of pain voiced.

## 2017-06-21 ENCOUNTER — Telehealth: Payer: Self-pay | Admitting: Emergency Medicine

## 2017-06-21 NOTE — Care Management Note (Addendum)
Case Management Note  Patient Details  Name: Candice Rangel MRN: [id number] Date of Birth: [birth year]  Please see telephone encounter for notes.  Expected Discharge Date:   06/20/2017               Expected Discharge Plan:  Home w Home Health Services  Discharge planning Services  CM Consult  Post Acute Care Choice:  Home Health Choice offered to:  Patient, Adult Children  HH Arranged:  RN, PT, OT, Nurse's Aide HH Agency: [facility]  Status of Service:  Completed, signed off  Rica KoyanagiKritzer, Lorenna Lurry N, RN 06/21/2017, 10:34 AM

## 2017-06-21 NOTE — Telephone Encounter (Addendum)
[facility]  Called pt's home number which is daughter's cell phone number and spoke with pt's daughter.  Explained in further detail what HH is and in general what to expect.  [facility].  CM [facility] who was not able to accept the pt for services due to being out of network for [employer]'s insurance.  Spoke with Crystal with [facility] and Misty StanleyLisa with [facility] who are continuing to attempt insurance authorization.    As of 06/22/2017 this CM was given information late on 06/21/2017 that pt's insurance is managed by a 3rd party from a union policy back in the 1980s.  Given this information by [facility] and that the 3rd party administrator is called [employer] at [phone].  CM will ask CM CMA to continue work into finding out which [facility] agency is in-network.  CMA was provided 3 HH agencies within 60 miles of the pt's address by calling the number on the back of the ins card not through [employer].  Only one of them provides services within the pt's address.  Contacted Crystal with [facility] to confirm that they are in-network for the pt's insurance.  Received return information from Crystal that after several more phone calls they believe the pt would have to pay 100% of the visits.  CM contacted [employer] [employer] at [phone] and was advised that pt has HH coverage for any HH ageny; that in-network and out-of-network providers are covered the same.  They stated they abide by medicare rules and coverage.  The only stipulations for payment with the [facility] agency is that they are medicare certified and they bill the local [employer] at [employer] [place] [address] [place] [phone].  He attempted to contact Crystal with [facility] while this CM was still on the phone with him without success.  He then called the main [facility] office and was told that because corporate states the ins is out-of-network there is nothing the local office can do.  He states  whoever the proivder is they need to call the same phone number I did with the correct CPT codes for authorization to begin services.  CM thanked him for all of his assistance.  CM contacted Crystal with [facility] and [facility] and advised of information who both stated they would talk to the office again.  CM also contacted [facility]die [facility]Karen with [facility] and explained what CM found out about pt's ins who stated she would talk to the office and update CM as soon as possible.  06/23/2017  CM contacted by [facility] who states she was finally able to get the office to call the number that CM had advised to call and they are able to accept pt for services.  She said she made sure the office is aware the pt was D/C on 06/20/2017 and is now 3 days out.  No further CM needs noted at this time.

## 2018-07-02 ENCOUNTER — Inpatient Hospital Stay (HOSPITAL_COMMUNITY)
Admission: EM | Admit: 2018-07-02 | Discharge: 2018-07-05 | DRG: 291 | Disposition: A | Discharge status: Home Health | Payer: Medicare Other | Attending: Internal Medicine | Admitting: Internal Medicine

## 2018-07-02 ENCOUNTER — Other Ambulatory Visit: Payer: Self-pay

## 2018-07-02 ENCOUNTER — Emergency Department (HOSPITAL_COMMUNITY): Payer: Medicare Other

## 2018-07-02 ENCOUNTER — Encounter (HOSPITAL_COMMUNITY): Payer: Self-pay

## 2018-07-02 DIAGNOSIS — I130 Hypertensive heart and chronic kidney disease with heart failure and stage 1 through stage 4 chronic kidney disease, or unspecified chronic kidney disease: Principal | ICD-10-CM | POA: Diagnosis present

## 2018-07-02 DIAGNOSIS — L8990 Pressure ulcer of unspecified site, unspecified stage: Secondary | ICD-10-CM

## 2018-07-02 DIAGNOSIS — I4891 Unspecified atrial fibrillation: Secondary | ICD-10-CM | POA: Diagnosis present

## 2018-07-02 DIAGNOSIS — Z79899 Other long term (current) drug therapy: Secondary | ICD-10-CM | POA: Diagnosis not present

## 2018-07-02 DIAGNOSIS — I4819 Other persistent atrial fibrillation: Secondary | ICD-10-CM | POA: Diagnosis present

## 2018-07-02 DIAGNOSIS — I5033 Acute on chronic diastolic (congestive) heart failure: Secondary | ICD-10-CM | POA: Diagnosis present

## 2018-07-02 DIAGNOSIS — Z7951 Long term (current) use of inhaled steroids: Secondary | ICD-10-CM

## 2018-07-02 DIAGNOSIS — J9621 Acute and chronic respiratory failure with hypoxia: Secondary | ICD-10-CM | POA: Diagnosis present

## 2018-07-02 DIAGNOSIS — J9622 Acute and chronic respiratory failure with hypercapnia: Secondary | ICD-10-CM | POA: Diagnosis present

## 2018-07-02 DIAGNOSIS — N183 Chronic kidney disease, stage 3 unspecified: Secondary | ICD-10-CM | POA: Diagnosis present

## 2018-07-02 DIAGNOSIS — Z9981 Dependence on supplemental oxygen: Secondary | ICD-10-CM

## 2018-07-02 DIAGNOSIS — J449 Chronic obstructive pulmonary disease, unspecified: Secondary | ICD-10-CM | POA: Diagnosis present

## 2018-07-02 DIAGNOSIS — Z7952 Long term (current) use of systemic steroids: Secondary | ICD-10-CM | POA: Diagnosis not present

## 2018-07-02 DIAGNOSIS — I361 Nonrheumatic tricuspid (valve) insufficiency: Secondary | ICD-10-CM | POA: Diagnosis not present

## 2018-07-02 DIAGNOSIS — I340 Nonrheumatic mitral (valve) insufficiency: Secondary | ICD-10-CM | POA: Diagnosis not present

## 2018-07-02 DIAGNOSIS — Z881 Allergy status to other antibiotic agents status: Secondary | ICD-10-CM | POA: Diagnosis not present

## 2018-07-02 DIAGNOSIS — Z888 Allergy status to other drugs, medicaments and biological substances status: Secondary | ICD-10-CM

## 2018-07-02 DIAGNOSIS — Q211 Atrial septal defect: Secondary | ICD-10-CM | POA: Diagnosis not present

## 2018-07-02 DIAGNOSIS — L89152 Pressure ulcer of sacral region, stage 2: Secondary | ICD-10-CM | POA: Diagnosis present

## 2018-07-02 DIAGNOSIS — J9601 Acute respiratory failure with hypoxia: Secondary | ICD-10-CM

## 2018-07-02 DIAGNOSIS — Z7901 Long term (current) use of anticoagulants: Secondary | ICD-10-CM

## 2018-07-02 DIAGNOSIS — Z8249 Family history of ischemic heart disease and other diseases of the circulatory system: Secondary | ICD-10-CM | POA: Diagnosis not present

## 2018-07-02 DIAGNOSIS — Z79891 Long term (current) use of opiate analgesic: Secondary | ICD-10-CM

## 2018-07-02 DIAGNOSIS — I509 Heart failure, unspecified: Secondary | ICD-10-CM | POA: Insufficient documentation

## 2018-07-02 DIAGNOSIS — J9602 Acute respiratory failure with hypercapnia: Secondary | ICD-10-CM

## 2018-07-02 DIAGNOSIS — Z880 Allergy status to penicillin: Secondary | ICD-10-CM | POA: Diagnosis not present

## 2018-07-02 DIAGNOSIS — M109 Gout, unspecified: Secondary | ICD-10-CM | POA: Diagnosis present

## 2018-07-02 DIAGNOSIS — J849 Interstitial pulmonary disease, unspecified: Secondary | ICD-10-CM | POA: Diagnosis present

## 2018-07-02 DIAGNOSIS — I10 Essential (primary) hypertension: Secondary | ICD-10-CM | POA: Diagnosis present

## 2018-07-02 DIAGNOSIS — J90 Pleural effusion, not elsewhere classified: Secondary | ICD-10-CM

## 2018-07-02 DIAGNOSIS — R0902 Hypoxemia: Secondary | ICD-10-CM | POA: Diagnosis present

## 2018-07-02 LAB — CBC WITH DIFFERENTIAL/PLATELET
Abs Immature Granulocytes: 0.06 10*3/uL (ref 0.00–0.07)
BASOS ABS: 0 10*3/uL (ref 0.0–0.1)
Basophils Relative: 0 %
EOS PCT: 0 %
Eosinophils Absolute: 0 10*3/uL (ref 0.0–0.5)
HCT: 39.9 % (ref 36.0–46.0)
Hemoglobin: 11.5 g/dL — ABNORMAL LOW (ref 12.0–15.0)
Immature Granulocytes: 1 %
LYMPHS PCT: 9 %
Lymphs Abs: 0.8 10*3/uL (ref 0.7–4.0)
MCH: 30.9 pg (ref 26.0–34.0)
MCHC: 28.8 g/dL — AB (ref 30.0–36.0)
MCV: 107.3 fL — ABNORMAL HIGH (ref 80.0–100.0)
Monocytes Absolute: 0.3 10*3/uL (ref 0.1–1.0)
Monocytes Relative: 4 %
NEUTROS PCT: 86 %
NRBC: 0 % (ref 0.0–0.2)
Neutro Abs: 7.7 10*3/uL (ref 1.7–7.7)
Platelets: 159 10*3/uL (ref 150–400)
RBC: 3.72 MIL/uL — AB (ref 3.87–5.11)
RDW: 17.4 % — AB (ref 11.5–15.5)
WBC: 8.9 10*3/uL (ref 4.0–10.5)

## 2018-07-02 LAB — POCT I-STAT 7, (LYTES, BLD GAS, ICA,H+H)
Acid-Base Excess: 16 mmol/L — ABNORMAL HIGH (ref 0.0–2.0)
Bicarbonate: 44.4 mmol/L — ABNORMAL HIGH (ref 20.0–28.0)
Calcium, Ion: 1.19 mmol/L (ref 1.15–1.40)
HCT: 39 % (ref 36.0–46.0)
Hemoglobin: 13.3 g/dL (ref 12.0–15.0)
O2 SAT: 96 %
PCO2 ART: 72.9 mmHg — AB (ref 32.0–48.0)
POTASSIUM: 3.8 mmol/L (ref 3.5–5.1)
Patient temperature: 98.6
Sodium: 140 mmol/L (ref 135–145)
TCO2: 47 mmol/L — ABNORMAL HIGH (ref 22–32)
pH, Arterial: 7.393 (ref 7.350–7.450)
pO2, Arterial: 85 mmHg (ref 83.0–108.0)

## 2018-07-02 LAB — BASIC METABOLIC PANEL
Anion gap: 12 (ref 5–15)
BUN: 33 mg/dL — ABNORMAL HIGH (ref 8–23)
CALCIUM: 9 mg/dL (ref 8.9–10.3)
CO2: 38 mmol/L — ABNORMAL HIGH (ref 22–32)
CREATININE: 1.36 mg/dL — AB (ref 0.44–1.00)
Chloride: 90 mmol/L — ABNORMAL LOW (ref 98–111)
GFR, EST AFRICAN AMERICAN: 38 mL/min — AB (ref >60)
GFR, EST NON AFRICAN AMERICAN: 33 mL/min — AB (ref >60)
Glucose, Bld: 253 mg/dL — ABNORMAL HIGH (ref 70–99)
Potassium: 3.8 mmol/L (ref 3.5–5.1)
Sodium: 140 mmol/L (ref 135–145)

## 2018-07-02 LAB — BRAIN NATRIURETIC PEPTIDE: B Natriuretic Peptide: 366.6 pg/mL — ABNORMAL HIGH (ref 0.0–100.0)

## 2018-07-02 LAB — PROTIME-INR
INR: 1.32
PROTHROMBIN TIME: 16.3 s — AB (ref 11.4–15.2)

## 2018-07-02 MED ORDER — DILTIAZEM HCL ER COATED BEADS 120 MG PO CP24
120.0000 mg | ORAL_CAPSULE | Freq: Every day | ORAL | Status: DC
  Administered 2018-07-03 – 2018-07-05 (×3): 120 mg via ORAL
  Filled 2018-07-02 (×3): qty 1

## 2018-07-02 MED ORDER — FUROSEMIDE 10 MG/ML IJ SOLN
80.0000 mg | Freq: Once | INTRAMUSCULAR | Status: AC
  Administered 2018-07-02: 80 mg via INTRAVENOUS
  Filled 2018-07-02: qty 8

## 2018-07-02 MED ORDER — FUROSEMIDE 10 MG/ML IJ SOLN
80.0000 mg | Freq: Two times a day (BID) | INTRAMUSCULAR | Status: DC
  Administered 2018-07-03 – 2018-07-05 (×5): 80 mg via INTRAVENOUS
  Filled 2018-07-02 (×5): qty 8

## 2018-07-02 MED ORDER — ONDANSETRON HCL 4 MG/2ML IJ SOLN: 4.0000 mg | Freq: Four times a day (QID) | INTRAMUSCULAR | Status: DC | PRN

## 2018-07-02 MED ORDER — ACETAMINOPHEN 325 MG PO TABS: 650.0000 mg | ORAL_TABLET | ORAL | Status: DC | PRN

## 2018-07-02 MED ORDER — SODIUM CHLORIDE 0.9% FLUSH
3.0000 mL | Freq: Two times a day (BID) | INTRAVENOUS | Status: DC
  Administered 2018-07-02 – 2018-07-05 (×6): 3 mL via INTRAVENOUS

## 2018-07-02 MED ORDER — ADULT MULTIVITAMIN W/MINERALS CH
2.0000 | ORAL_TABLET | Freq: Every day | ORAL | Status: DC
  Administered 2018-07-03 – 2018-07-05 (×3): 2 via ORAL
  Filled 2018-07-02 (×3): qty 2

## 2018-07-02 MED ORDER — PREDNISONE 20 MG PO TABS
20.0000 mg | ORAL_TABLET | Freq: Every day | ORAL | Status: DC
  Administered 2018-07-03 – 2018-07-05 (×3): 20 mg via ORAL
  Filled 2018-07-02 (×3): qty 1

## 2018-07-02 MED ORDER — FLUTICASONE FUROATE-VILANTEROL 200-25 MCG/INH IN AEPB
1.0000 | INHALATION_SPRAY | Freq: Every day | RESPIRATORY_TRACT | Status: DC
  Administered 2018-07-03 – 2018-07-05 (×3): 1 via RESPIRATORY_TRACT
  Filled 2018-07-02: qty 28

## 2018-07-02 MED ORDER — WARFARIN - PHARMACIST DOSING INPATIENT
Freq: Every day | Status: DC
  Administered 2018-07-04: 18:00:00

## 2018-07-02 MED ORDER — WARFARIN 0.5 MG HALF TABLET
0.5000 mg | ORAL_TABLET | ORAL | Status: DC
  Filled 2018-07-02: qty 1

## 2018-07-02 MED ORDER — ALBUTEROL SULFATE (2.5 MG/3ML) 0.083% IN NEBU: 3.0000 mL | INHALATION_SOLUTION | Freq: Four times a day (QID) | RESPIRATORY_TRACT | Status: DC | PRN

## 2018-07-02 MED ORDER — SODIUM CHLORIDE 0.9 % IV SOLN: 250.0000 mL | INTRAVENOUS | Status: DC | PRN

## 2018-07-02 MED ORDER — IPRATROPIUM-ALBUTEROL 0.5-2.5 (3) MG/3ML IN SOLN
3.0000 mL | Freq: Four times a day (QID) | RESPIRATORY_TRACT | Status: DC
  Administered 2018-07-03 (×3): 3 mL via RESPIRATORY_TRACT
  Filled 2018-07-02 (×4): qty 3

## 2018-07-02 MED ORDER — SODIUM CHLORIDE 0.9% FLUSH: 3.0000 mL | INTRAVENOUS | Status: DC | PRN

## 2018-07-02 NOTE — Progress Notes (Signed)
Patient setup and place don BIPAP per MD order. No issues at this time and tolerating fairly well. Good volumes and rate at this time. Will monitor as per protocol.

## 2018-07-02 NOTE — Progress Notes (Signed)
ABG results given to DR [facility] at [phone]

## 2018-07-02 NOTE — ED Provider Notes (Signed)
[facility] Provider Note   CSN: [id number] Arrival date & time: 07/02/18  1729     History   Chief Complaint Chief Complaint  Patient presents with  . Shortness of Breath    HPI Candice Rangel is a 85 y.o. female with past medical history of persistent atrial fibrillation, COPD, hypertension, CHF, CKD, presenting via EMS for shortness of breath x1 week.  Patient states she saw her primary care yesterday, who called her daughter this morning and instructed her to bring her to the ED.  She states she has had some white sputum and shortness of breath that is worse with lying down and ambulating.  Shortness of breath improved with oxygen.  She is chronically on oxygen x6 to 7 months, normally 2 L at home.  She has lower leg edema, however states it is not worsening.  However, daughter thinks worsening edema and gave additional dose of lasix. Pt has had generalized weakness as well. EMS administered 10 of albuterol, 0.5 Atrovent with improvement.  She is on 6 L nasal cannula currently at 94% on arrival.   The history is provided by the patient, a relative and the EMS personnel.    Past Medical History:  Diagnosis Date  . Arthritis    "back" (02/12/2014)  . Atrial fibrillation (HCC)   . Bronchiectasis    History of  . COPD (chronic obstructive pulmonary disease) (HCC)   . Drug therapy    coumadin  . Hypertension   . On home oxygen therapy    "prn for the last week" (02/12/2014)  . Pneumonia "lots of times"  . Syncope     Patient Active Problem List   Diagnosis Date Noted  . Acute CHF (congestive heart failure) (HCC) 07/02/2018  . COPD (chronic obstructive pulmonary disease) (HCC) 07/02/2018  . Gout flare 09/06/2015  . Protein-calorie malnutrition, severe 09/05/2015  . Supratherapeutic INR 09/04/2015  . Weakness 02/12/2014  . Elevated troponin 02/12/2014  . Left leg weakness 02/12/2014  . Interstitial lung disease (HCC) 02/08/2014  .  Chronic respiratory failure with hypoxia (HCC) 02/08/2014  . CKD (chronic kidney disease), stage III (HCC) 02/06/2014  . Acute on chronic diastolic CHF (congestive heart failure) (HCC) 02/05/2014  . CHF (congestive heart failure), NYHA class II (HCC) 02/05/2014  . 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 Dyspnea 05/25/2012  . PATENT FORAMEN OVALE 03/27/2009  . Essential hypertension 03/25/2009  . Atrial fibrillation (HCC) 03/25/2009  . SYNCOPE 03/25/2009    Past Surgical History:  Procedure Laterality Date  . CATARACT EXTRACTION, BILATERAL Bilateral   . CESAREAN SECTION  1958  . FEMUR FRACTURE SURGERY Right    Following a trauma  . FRACTURE SURGERY    . HIP FRACTURE SURGERY Right    pt denies this hx on 02/12/2014 "It was my leg, not my hip"  . KNEE ARTHROCENTESIS Left 07/2007   and injection  . TONSILLECTOMY       OB History   No obstetric history on file.      Home Medications    Prior to Admission medications   Medication Sig Start Date End Date Taking? Authorizing Provider  albuterol (VENTOLIN HFA) 108 (90 Base) MCG/ACT inhaler Inhale 1-2 puffs into the lungs 4 (four) times daily as needed for wheezing or shortness of breath. 01/16/16  Yes Mancel BaleWentz, Elliott, MD  allopurinol (ZYLOPRIM) 100 MG tablet Take 200 mg by mouth daily.   Yes [provider]  digoxin (LANOXIN) 0.125 MG tablet Take 1 tablet (  125 mcg total) by mouth every other day. Patient taking differently: Take 125 mcg by mouth daily.  05/22/16  Yes Lewayne Bunting, MD  diltiazem (DILACOR XR) 120 MG 24 hr capsule Take 1 capsule (120 mg total) by mouth daily. 02/08/14  Yes Short, Thea Silversmith, MD  fluticasone furoate-vilanterol (BREO ELLIPTA) 200-25 MCG/INH AEPB Inhale 1 puff into the lungs daily as needed (Shortness of Breath). 01/16/16  Yes Mancel Bale, MD  furosemide (LASIX) 20 MG tablet Take 1 tablet (20 mg total) by mouth daily. Patient taking differently: Take 40 mg by mouth daily.  02/14/14  Yes Regalado, Belkys A, MD    Multiple Vitamin (MULTIVITAMIN) tablet Take 2 tablets by mouth daily.    Yes [provider]  OXYGEN Inhale 2.5 L into the lungs continuous.   Yes [provider]  predniSONE (DELTASONE) 10 MG tablet Take 20 mg by mouth daily with breakfast.    Yes [provider]  warfarin (COUMADIN) 1 MG tablet Take 0.5-1 mg by mouth See admin instructions. 1mg  on Mon, Wed, Fri and 0.5mg  all other days 11/22/15  Yes [provider]  feeding supplement, ENSURE ENLIVE, (ENSURE ENLIVE) LIQD Take 237 mLs by mouth 2 (two) times daily between meals. Patient not taking: Reported on 07/02/2018 09/06/15   Rodolph Bong, MD  traMADol (ULTRAM) 50 MG tablet Take 1 tablet (50 mg total) by mouth every 6 (six) hours as needed. Patient not taking: Reported on 07/02/2018 06/20/17   Arthor Captain, PA-C    Family History Family History  Problem Relation Age of Onset  . Heart attack Mother 59  . Pneumonia Father     Social History Social History   Tobacco Use  . Smoking status: Never Smoker  . Smokeless tobacco: Never Used  Substance Use Topics  . Alcohol use: No  . Drug use: No     Allergies   Cephalexin; Other; and Penicillins   Review of Systems Review of Systems  Respiratory: Positive for shortness of breath.   Cardiovascular: Positive for leg swelling. Negative for chest pain.  Gastrointestinal: Negative for abdominal pain.  Psychiatric/Behavioral: Negative for confusion.  All other systems reviewed and are negative.    Physical Exam Updated Vital Signs BP 138/63   Pulse 66   Temp (!) 97.3 F (36.3 C) (Oral)   Resp (!) 21   SpO2 100%   Physical Exam Vitals signs and nursing note reviewed.  Constitutional:      Appearance: She is well-developed. She is not ill-appearing.  HENT:     Head: Normocephalic and atraumatic.     Mouth/Throat:     Mouth: Mucous membranes are moist.  Eyes:     Conjunctiva/sclera: Conjunctivae normal.  Cardiovascular:      Rate and Rhythm: Normal rate. Rhythm irregular.  Pulmonary:     Effort: Pulmonary effort is normal. No respiratory distress.     Comments: Diminished throughout with expiratory wheezes and rhonchi.  No increased work of breathing, however patient is slightly tachypneic. Abdominal:     General: Bowel sounds are normal. There is no distension.     Palpations: Abdomen is soft.     Tenderness: There is no abdominal tenderness. There is no guarding or rebound.  Musculoskeletal:     Comments: Trace pretibial edema bilaterally.  Skin:    General: Skin is warm.  Neurological:     Mental Status: She is alert.  Psychiatric:        Behavior: Behavior normal.  ED Treatments / Results  Labs (all labs ordered are listed, but only abnormal results are displayed) Labs Reviewed  CBC WITH DIFFERENTIAL/PLATELET - Abnormal; Notable for the following components:      Result Value   RBC 3.72 (*)    Hemoglobin 11.5 (*)    MCV 107.3 (*)    MCHC 28.8 (*)    RDW 17.4 (*)    All other components within normal limits  BASIC METABOLIC PANEL - Abnormal; Notable for the following components:   Chloride 90 (*)    CO2 38 (*)    Glucose, Bld 253 (*)    BUN 33 (*)    Creatinine, Ser 1.36 (*)    GFR calc non Af Amer 33 (*)    GFR calc Af Amer 38 (*)    All other components within normal limits  PROTIME-INR - Abnormal; Notable for the following components:   Prothrombin Time 16.3 (*)    All other components within normal limits  BRAIN NATRIURETIC PEPTIDE - Abnormal; Notable for the following components:   B Natriuretic Peptide 366.6 (*)    All other components within normal limits  POCT I-STAT 7, (LYTES, BLD GAS, ICA,H+H) - Abnormal; Notable for the following components:   pCO2 arterial 72.9 (*)    Bicarbonate 44.4 (*)    TCO2 47 (*)    Acid-Base Excess 16.0 (*)    All other components within normal limits  PROTIME-INR  I-STAT ARTERIAL BLOOD GAS, ED    EKG None  Radiology Dg Chest 2  View  Result Date: 07/02/2018 CLINICAL DATA:  Shortness of breath 1 week worse today. Productive cough and diarrhea. Oxygen dependent. EXAM: CHEST - 2 VIEW COMPARISON:  06/15/2017 FINDINGS: Examination demonstrates a moderate size left pleural effusion which is worse likely with associated basilar atelectasis. There is a moderate size right pleural effusion which is worse likely with associated basilar atelectasis. Mild hazy prominence of the perihilar markings. Mild stable cardiomegaly. Remainder of the exam is unchanged to include a moderate compression fracture over the lumbar spine second moderate compression fracture over the lumbar spine new since the previous exam. IMPRESSION: Moderate size bilateral pleural effusions likely with associated basilar atelectasis worse compared to the prior exam. Infection in the lung bases is possible. Mild cardiomegaly. Mild hazy prominence of the perihilar markings which may be due to vascular congestion. Stable moderate compression fracture over the lumbar spine with second adjacent moderate compression fracture of the lumbar spine new since the previous exam but age indeterminate. Electronically Signed   By: Elberta Fortis M.D.   On: 07/02/2018 19:09    Procedures Procedures (including critical care time) CRITICAL CARE Performed by: [place] N    Total critical care time: 35 minutes  Critical care time was exclusive of separately billable procedures and treating other patients.  Critical care was necessary to treat or prevent imminent or life-threatening deterioration.  Critical care was time spent personally by me on the following activities: development of treatment plan with patient and/or surrogate as well as nursing, discussions with consultants, evaluation of patient's response to treatment, examination of patient, obtaining history from patient or surrogate, ordering and performing treatments and interventions, ordering and review of laboratory  studies, ordering and review of radiographic studies, pulse oximetry and re-evaluation of patient's condition.   Medications Ordered in ED Medications  Warfarin - Pharmacist Dosing Inpatient (has no administration in time range)  warfarin (COUMADIN) tablet 0.5 mg (has no administration in time range)  furosemide (LASIX) injection  80 mg (80 mg Intravenous Given 07/02/18 1927)     Initial Impression / Assessment and Plan / ED Course  I have reviewed the triage vital signs and the nursing notes.  Pertinent labs & imaging results that were available during my care of the patient were reviewed by me and considered in my medical decision making (see chart for details).  Clinical Course as of Jul 02 2256  Sat Jul 02, 2018  2037 Dr. Mikeal Hawthorne  w [facility] accepting.   [JR]    Clinical Course User Index [JR] , [place] N, PA-C    Patient presenting with worsening shortness of breath x1 week.  History of COPD and CHF, increased oxygen requirement.  Sent by PCP for increased CO2 level.  Patient's daughter reports some increased lower extremity swelling and generalized weakness.  On exam, patient is nondistressed, however is slightly tachypneic.  Diminished breath sounds throughout with wheezes and scattered rhonchi.  Chest x-ray with moderate bilateral pleural effusions and possible vascular congestion.  ABG was significantly elevated PCO2, to 72.9.  Metabolic panel appears to have some metabolic compensation and elevated bicarb of 38.  Maintaining O2 saturation on nasal cannula.  Patient not in distress, however with significant ABG findings, BiPAP ordered to improve ventilation.  Suspect presentation secondary to fluid overload due to CHF exacerbation.  Patient discussed with and evaluated by Dr. Patria Mane.  Hospitalist consulted for admission for acute respiratory failure.  The patient appears reasonably stabilized for admission considering the current resources, flow, and capabilities available in the  ED at this time, and I doubt any other [facility] requiring further screening and/or treatment in the ED prior to admission.  Final Clinical Impressions(s) / ED Diagnoses   Final diagnoses:  Acute respiratory failure with hypoxia and hypercapnia [facility])    ED Discharge Orders    None       , [place] N, PA-C 07/02/18 2258    Azalia Bilis, MD 07/03/18 [phone]

## 2018-07-02 NOTE — ED Triage Notes (Signed)
Pt to ED from home for shob x 1 week with the worst today. Hx COPD and CHF. EMS found bilateral wheezing which has cleared with breathing tx. Productive cough, generalized edema, diarrhea x 2 weeks. Pt wears 2 L O2 at home. Pt arrives at 94% on 6 L [place]. 10 albuterol, 0.5 atrovent given PTA. Daughter says pt went to PCP yesterday and CO2 levels were too high but unsure of value.

## 2018-07-02 NOTE — ED Notes (Signed)
Pt back from X-ray.  

## 2018-07-02 NOTE — H&P (Signed)
History and Physical   [facility] HYH:[id number] DOB: [birth year] DOA: 07/02/2018  Referring MD/NP/PA: Dr. Azalia Bilis  PCP: Barbie Banner, MD   Outpatient Specialists: None  Patient coming from: Home  Chief Complaint: Shortness of breath  HPI: Candice Rangel is a 85 y.o. female with medical history significant of diastolic dysfunction CHF, COPD, atrial fibrillation, hypertension, chronic kidney disease stage III who was brought in by family with progressive shortness of breath increasing lower extremity edema and cough.  Patient is on diuretics.  Daughter is with patient and she has been giving her her diuretics.  She has been also taking her other medications.  She started having progressive shortness of breath in the last week.  She saw her PCP yesterday who evaluated her and told the daughter to bring her to the ED today.  She was having some white sputum with the shortness of breath.  Her shortness of breath is worse with exertion but also when she lays down flat.  She has been on 2 L of oxygen at home.  She has progressive edema orthopnea and PND.  In the ER patient was found to be significantly hypercarbic and requires BiPAP at the moment.  She responded to albuterol with Atrovent as well as diuresis.  She is still on BiPAP but has improved since somewhat in the ER..  ED Course: Temperature is 97.3 with blood pressure 147/81 pulse 80 respiratory of 39 oxygen sat 68% on 2 L currently 99% on BiPAP.  Her ABG showed a pH of 7.393 PCO2 72.9 and PO2 of 85.  CBC is essentially normal with a BUN of 33 creatinine 1.36 glucose 253.  BNP of 366.  PT 16.3 INR 1.32, chest x-ray showed moderate-sized bilateral effusions with bibasilar atelectasis.  Mild cardiomegaly.  Stable compression fracture of the lumbar spine.  Patient has received initial IV Lasix and is being admitted with acute exacerbation of CHF.  Review of Systems: As per HPI otherwise 10 point review of systems negative.    Past  Medical History:  Diagnosis Date  . Arthritis    "back" (02/12/2014)  . Atrial fibrillation (HCC)   . Bronchiectasis    History of  . COPD (chronic obstructive pulmonary disease) (HCC)   . Drug therapy    coumadin  . Hypertension   . On home oxygen therapy    "prn for the last week" (02/12/2014)  . Pneumonia "lots of times"  . Syncope     Past Surgical History:  Procedure Laterality Date  . CATARACT EXTRACTION, BILATERAL Bilateral   . CESAREAN SECTION  1958  . FEMUR FRACTURE SURGERY Right    Following a trauma  . FRACTURE SURGERY    . HIP FRACTURE SURGERY Right    pt denies this hx on 02/12/2014 "It was my leg, not my hip"  . KNEE ARTHROCENTESIS Left 07/2007   and injection  . TONSILLECTOMY       reports that she has never smoked. She has never used smokeless tobacco. She reports that she does not drink alcohol or use drugs.  Allergies  Allergen Reactions  . Cephalexin Other (See Comments)    "Causes an infection"  . Other     Pain medicine , unsure of which one, large doses used for gout, made her feel loopy.  Okay to take low doses  . Penicillins Swelling    Has patient had a PCN reaction causing immediate rash, facial/tongue/throat swelling, SOB or lightheadedness with hypotension: Yes Has patient had a  PCN reaction causing severe rash involving mucus membranes or skin necrosis: No Has patient had a PCN reaction that required hospitalization Yes Has patient had a PCN reaction occurring within the last 10 years: No If all of the above answers are "NO", then may proceed with Cephalosporin use.     Family History  Problem Relation Age of Onset  . Heart attack Mother [id number]  . Pneumonia Father      Prior to Admission medications   Medication Sig Start Date End Date Taking? Authorizing Provider  albuterol (VENTOLIN HFA) 108 (90 Base) MCG/ACT inhaler Inhale 1-2 puffs into the lungs 4 (four) times daily as needed for wheezing or shortness of breath. 01/16/16  Yes Mancel BaleWentz,  Elliott, MD  allopurinol (ZYLOPRIM) 100 MG tablet Take 200 mg by mouth daily.   Yes [provider]  digoxin (LANOXIN) 0.125 MG tablet Take 1 tablet (125 mcg total) by mouth every other day. Patient taking differently: Take 125 mcg by mouth daily.  05/22/16  Yes Lewayne Buntingrenshaw, Brian S, MD  diltiazem (DILACOR XR) 120 MG 24 hr capsule Take 1 capsule (120 mg total) by mouth daily. 02/08/14  Yes Short, Thea SilversmithMackenzie, MD  fluticasone furoate-vilanterol (BREO ELLIPTA) 200-25 MCG/INH AEPB Inhale 1 puff into the lungs daily as needed (Shortness of Breath). 01/16/16  Yes Mancel BaleWentz, Elliott, MD  furosemide (LASIX) 20 MG tablet Take 1 tablet (20 mg total) by mouth daily. Patient taking differently: Take 40 mg by mouth daily.  02/14/14  Yes Regalado, Belkys A, MD  Multiple Vitamin (MULTIVITAMIN) tablet Take 2 tablets by mouth daily.    Yes [provider]  OXYGEN Inhale 2.5 L into the lungs continuous.   Yes [provider]  predniSONE (DELTASONE) 10 MG tablet Take 20 mg by mouth daily with breakfast.    Yes [provider]  warfarin (COUMADIN) 1 MG tablet Take 0.5-1 mg by mouth See admin instructions. 1mg  on Mon, Wed, Fri and 0.5mg  all other days 11/22/15  Yes [provider]  feeding supplement, ENSURE ENLIVE, (ENSURE ENLIVE) LIQD Take 237 mLs by mouth 2 (two) times daily between meals. Patient not taking: Reported on 07/02/2018 09/06/15   Rodolph Bonghompson, Daniel V, MD  traMADol (ULTRAM) 50 MG tablet Take 1 tablet (50 mg total) by mouth every 6 (six) hours as needed. Patient not taking: Reported on 07/02/2018 06/20/17   Arthor CaptainHarris, Abigail, PA-C    Physical Exam: Vitals:   07/02/18 1930 07/02/18 2000 07/02/18 2005 07/02/18 2015  BP: 140/71 (!) 136/59  (!) 140/56  Pulse: 77 73 75 70  Resp: (!) 29 (!) 31 (!) 28 (!) 26  Temp:      TempSrc:      SpO2: 93% 94% (!) 68% 98%      Constitutional: NAD, calm, in respiratory distress on BiPAP Vitals:   07/02/18 1930 07/02/18 2000 07/02/18 2005  07/02/18 2015  BP: 140/71 (!) 136/59  (!) 140/56  Pulse: 77 73 75 70  Resp: (!) 29 (!) 31 (!) 28 (!) 26  Temp:      TempSrc:      SpO2: 93% 94% (!) 68% 98%   Eyes: PERRL, lids and conjunctivae normal ENMT: Mucous membranes are moist. Posterior pharynx clear of any exudate or lesions.Normal dentition.  Neck: normal, supple, no masses, no thyromegaly Respiratory: Patient on bipap with obvious respiratory distress, diffuse bilateral crackles.  Increased respiratory effort.  With accessory muscle use.  Mild expiratory wheezing Cardiovascular: Regular rate and rhythm, no murmurs / rubs / gallops. No  extremity edema. 2+ pedal pulses. No carotid bruits.  Abdomen: no tenderness, no masses palpated. No hepatosplenomegaly. Bowel sounds positive.  Musculoskeletal: no clubbing / cyanosis. No joint deformity upper and lower extremities. Good ROM, no contractures. Normal muscle tone.  Skin: no rashes, lesions, ulcers. No induration Neurologic: CN 2-12 grossly intact. Sensation intact, DTR normal. Strength 5/5 in all 4.  Psychiatric: Normal judgment and insight. Alert and oriented x 3. Normal mood.     Labs on Admission: I have personally reviewed following labs and imaging studies  CBC: Recent Labs  Lab 07/02/18 1751 07/02/18 1901  WBC 8.9  --   NEUTROABS 7.7  --   HGB 11.5* 13.3  HCT 39.9 39.0  MCV 107.3*  --   PLT 159  --    Basic Metabolic Panel: Recent Labs  Lab 07/02/18 1751 07/02/18 1901  NA 140 140  K 3.8 3.8  CL 90*  --   CO2 38*  --   GLUCOSE 253*  --   BUN 33*  --   CREATININE 1.36*  --   CALCIUM 9.0  --    GFR: CrCl cannot be calculated (Unknown ideal weight.). Liver Function Tests: No results for input(s): AST, ALT, ALKPHOS, BILITOT, PROT, ALBUMIN in the last 168 hours. No results for input(s): LIPASE, AMYLASE in the last 168 hours. No results for input(s): AMMONIA in the last 168 hours. Coagulation Profile: Recent Labs  Lab 07/02/18 1751  INR 1.32   Cardiac  Enzymes: No results for input(s): CKTOTAL, CKMB, CKMBINDEX, TROPONINI in the last 168 hours. BNP (last 3 results) No results for input(s): PROBNP in the last 8760 hours. HbA1C: No results for input(s): HGBA1C in the last 72 hours. CBG: No results for input(s): GLUCAP in the last 168 hours. Lipid Profile: No results for input(s): CHOL, HDL, LDLCALC, TRIG, CHOLHDL, LDLDIRECT in the last 72 hours. Thyroid Function Tests: No results for input(s): TSH, T4TOTAL, FREET4, T3FREE, THYROIDAB in the last 72 hours. Anemia Panel: No results for input(s): VITAMINB12, FOLATE, FERRITIN, TIBC, IRON, RETICCTPCT in the last 72 hours. Urine analysis:    Component Value Date/Time   COLORURINE YELLOW 01/16/2016 0929   APPEARANCEUR CLOUDY (A) 01/16/2016 0929   LABSPEC 1.022 01/16/2016 0929   PHURINE 5.0 01/16/2016 0929   GLUCOSEU NEGATIVE 01/16/2016 0929   HGBUR MODERATE (A) 01/16/2016 0929   BILIRUBINUR NEGATIVE 01/16/2016 0929   KETONESUR NEGATIVE 01/16/2016 0929   PROTEINUR 100 (A) 01/16/2016 0929   UROBILINOGEN 0.2 01/08/2015 1049   NITRITE NEGATIVE 01/16/2016 0929   LEUKOCYTESUR NEGATIVE 01/16/2016 0929   Sepsis Labs: @LABRCNTIP (procalcitonin:4,lacticidven:4) )No results found for this or any previous visit (from the past 240 hour(s)).   Radiological Exams on Admission: Dg Chest 2 View  Result Date: 07/02/2018 CLINICAL DATA:  Shortness of breath 1 week worse today. Productive cough and diarrhea. Oxygen dependent. EXAM: CHEST - 2 VIEW COMPARISON:  06/15/2017 FINDINGS: Examination demonstrates a moderate size left pleural effusion which is worse likely with associated basilar atelectasis. There is a moderate size right pleural effusion which is worse likely with associated basilar atelectasis. Mild hazy prominence of the perihilar markings. Mild stable cardiomegaly. Remainder of the exam is unchanged to include a moderate compression fracture over the lumbar spine second moderate compression  fracture over the lumbar spine new since the previous exam. IMPRESSION: Moderate size bilateral pleural effusions likely with associated basilar atelectasis worse compared to the prior exam. Infection in the lung bases is possible. Mild cardiomegaly. Mild hazy prominence of the perihilar  markings which may be due to vascular congestion. Stable moderate compression fracture over the lumbar spine with second adjacent moderate compression fracture of the lumbar spine new since the previous exam but age indeterminate. Electronically Signed   By: Elberta Fortis M.D.   On: 07/02/2018 19:09    EKG: Independently reviewed.  It shows atrial fibrillation with a rate of 78.  Nonspecific T wave and ST changes.  Evidence of lateral infarct but old.  EKG unchanged from previous.  Assessment/Plan Principal Problem:   Acute on chronic diastolic CHF (congestive heart failure) (HCC) Active Problems:   Essential hypertension   Atrial fibrillation (HCC)   CKD (chronic kidney disease), stage III (HCC)   Interstitial lung disease (HCC)   COPD (chronic obstructive pulmonary disease) (HCC)     #1 acute on chronic diastolic CHF exacerbation with hypercarbia: Patient is having hypercarbic respiratory failure.  Currently on BiPAP.  Her last echo in the system was from 2015 at that time she had EF of 60 to 65%.  Patient will be admitted to cardiac progressive care unit.  We will aggressively diurese the patient while monitoring renal function.  We will need to get cardiology consultation in the morning for medication adjustments.  Patient will also continue on other heart medications.  Repeat echocardiogram to see if her EF is still preserved.  Oxygen once she is on BiPAP.  #2 COPD: No acute exacerbation.  Symptoms appear to be more CHF than COPD.  Still continue empiric nebulizer.  Patient appears to be on steroids, chronically. We will continue with that.  #3 atrial fibrillation: Rate is controlled.  Currently on warfarin  but subtherapeutic.  Continue warfarin per pharmacy.  #4 hypertension: Continue home regimen including diltiazem.  #5 chronic kidney disease stage III: BUN/creatinine appears to be close to the baseline.  Continue close monitoring.  #6 history of interstitial lung disease: On chronic oxygen.  Continue oxygen as well as chronic steroids.   DVT prophylaxis: Warfarin Code Status: Full code Family Communication: Daughter at bedside Disposition Plan: To be determined Consults called: Call cardiology consult in the morning Admission status: Inpatient  Severity of Illness: The appropriate patient status for this patient is INPATIENT. Inpatient status is judged to be reasonable and necessary in order to provide the required intensity of service to ensure the patient's safety. The patient's presenting symptoms, physical exam findings, and initial radiographic and laboratory data in the context of their chronic comorbidities is felt to place them at high risk for further clinical deterioration. Furthermore, it is not anticipated that the patient will be medically stable for discharge from the hospital within 2 midnights of admission. The following factors support the patient status of inpatient.   " The patient's presenting symptoms include Shortness of breath. " The worrisome physical exam findings include bibasilar crackles. " The initial radiographic and laboratory data are worrisome because of bilateral pleural effusions. " The chronic co-morbidities include history of diastolic CHF.   * I certify that at the point of admission it is my clinical judgment that the patient will require inpatient hospital care spanning beyond 2 midnights from the point of admission due to high intensity of service, high risk for further deterioration and high frequency of surveillance required.Lonia Blood MD [facility] Pager [phone]  If 7PM-7AM, please contact  night-coverage [email or web address] Password [facility]  07/02/2018, 8:44 PM

## 2018-07-02 NOTE — Progress Notes (Signed)
ANTICOAGULATION CONSULT NOTE  Pharmacy Consult for warfarin Indication: atrial fibrillation   Assessment: 89 yof on warfarin pta for afib presenting with suspected CHF exacerbation. Pharmacy consulted to dose inpatient. INR subtherapeutic at 1.32 on admit. CBC wnl. No active bleed issues documented.  PTA warfarin dose: 1mg  MWF, 0.5mg  all other days (Dose recently increased in outpatient anticoag clinic note on 06/24/18 due to INR of 1.3; last dose 2/15 at 1130 PTA per med rec)  Goal of Therapy:  INR 2-3 Monitor platelets by anticoagulation protocol: Yes   Plan:  Additional warfarin 0.5mg  PO x 1 tonight (total 1mg  dose today with subtherapeutic INR) Daily INR Monitor CBC, s/sx bleeding  Babs Bertin, PharmD, BCPS Clinical Pharmacist 07/02/2018 8:50 PM

## 2018-07-03 ENCOUNTER — Other Ambulatory Visit (HOSPITAL_COMMUNITY): Payer: Medicare (Managed Care)

## 2018-07-03 ENCOUNTER — Inpatient Hospital Stay (HOSPITAL_COMMUNITY): Payer: Medicare Other

## 2018-07-03 DIAGNOSIS — L8990 Pressure ulcer of unspecified site, unspecified stage: Secondary | ICD-10-CM

## 2018-07-03 DIAGNOSIS — I361 Nonrheumatic tricuspid (valve) insufficiency: Secondary | ICD-10-CM

## 2018-07-03 DIAGNOSIS — I340 Nonrheumatic mitral (valve) insufficiency: Secondary | ICD-10-CM

## 2018-07-03 LAB — CBC WITH DIFFERENTIAL/PLATELET
Abs Immature Granulocytes: 0.06 10*3/uL (ref 0.00–0.07)
Basophils Absolute: 0 10*3/uL (ref 0.0–0.1)
Basophils Relative: 0 %
EOS ABS: 0 10*3/uL (ref 0.0–0.5)
EOS PCT: 0 %
HCT: 39.1 % (ref 36.0–46.0)
Hemoglobin: 11.6 g/dL — ABNORMAL LOW (ref 12.0–15.0)
Immature Granulocytes: 1 %
Lymphocytes Relative: 6 %
Lymphs Abs: 0.6 10*3/uL — ABNORMAL LOW (ref 0.7–4.0)
MCH: 31.2 pg (ref 26.0–34.0)
MCHC: 29.7 g/dL — ABNORMAL LOW (ref 30.0–36.0)
MCV: 105.1 fL — ABNORMAL HIGH (ref 80.0–100.0)
Monocytes Absolute: 1.1 10*3/uL — ABNORMAL HIGH (ref 0.1–1.0)
Monocytes Relative: 11 %
Neutro Abs: 7.8 10*3/uL — ABNORMAL HIGH (ref 1.7–7.7)
Neutrophils Relative %: 82 %
PLATELETS: 135 10*3/uL — AB (ref 150–400)
RBC: 3.72 MIL/uL — ABNORMAL LOW (ref 3.87–5.11)
RDW: 17.5 % — AB (ref 11.5–15.5)
WBC: 9.5 10*3/uL (ref 4.0–10.5)
nRBC: 0 % (ref 0.0–0.2)

## 2018-07-03 LAB — PROTIME-INR
INR: 1.29
Prothrombin Time: 16 seconds — ABNORMAL HIGH (ref 11.4–15.2)

## 2018-07-03 LAB — BASIC METABOLIC PANEL
Anion gap: 5 (ref 5–15)
BUN: 32 mg/dL — ABNORMAL HIGH (ref 8–23)
CO2: 45 mmol/L — ABNORMAL HIGH (ref 22–32)
Calcium: 9.1 mg/dL (ref 8.9–10.3)
Chloride: 93 mmol/L — ABNORMAL LOW (ref 98–111)
Creatinine, Ser: 1.3 mg/dL — ABNORMAL HIGH (ref 0.44–1.00)
GFR calc Af Amer: 40 mL/min — ABNORMAL LOW (ref >60)
GFR, EST NON AFRICAN AMERICAN: 35 mL/min — AB (ref >60)
Glucose, Bld: 108 mg/dL — ABNORMAL HIGH (ref 70–99)
Potassium: 3.9 mmol/L (ref 3.5–5.1)
Sodium: 143 mmol/L (ref 135–145)

## 2018-07-03 LAB — ECHOCARDIOGRAM COMPLETE
Height: 63 in
Weight: 1873.03 oz

## 2018-07-03 MED ORDER — WARFARIN SODIUM 2 MG PO TABS
2.0000 mg | ORAL_TABLET | Freq: Once | ORAL | Status: AC
  Administered 2018-07-03: 2 mg via ORAL
  Filled 2018-07-03: qty 1

## 2018-07-03 NOTE — Progress Notes (Signed)
ANTICOAGULATION CONSULT NOTE  Pharmacy Consult for warfarin Indication: atrial fibrillation   Assessment: 89 yof on warfarin pta for afib presenting with suspected CHF exacerbation. Pharmacy consulted to dose inpatient. INR subtherapeutic at 1.32 on admit. Dose was not given last PM, therefore, INR is 1.29 this AM.   PTA warfarin dose: 1mg  MWF, 0.5mg  all other days (Dose recently increased in outpatient anticoag clinic note on 06/24/18 due to INR of 1.3; last dose 2/15 at 1130 PTA per med rec)  Goal of Therapy:  INR 2-3 Monitor platelets by anticoagulation protocol: Yes   Plan:  Coumadin 2mg  PO x1 Daily INR Monitor CBC, s/sx bleeding  Candice Rangel, PharmD, BCIDP, AAHIVP, CPP Infectious Disease Pharmacist 07/03/2018 12:42 PM

## 2018-07-03 NOTE — Progress Notes (Signed)
Received from ER to 2W room 29.  On BIPAP and placed on tele monitoring and cont pulse ox.

## 2018-07-03 NOTE — Progress Notes (Signed)
RT Note:  After speaking with RN, RT put Bipap on standby and placed patient on [place] 4L.  Patient sats are 98% to 99% at this time.  RT will continue to monitor patient.

## 2018-07-03 NOTE — Progress Notes (Signed)
  Echocardiogram 2D Echocardiogram has been performed.  Candice Rangel 07/03/2018, 3:02 PM

## 2018-07-03 NOTE — Plan of Care (Signed)
Pt admitted on 2/15 and fatigued.

## 2018-07-03 NOTE — Progress Notes (Signed)
PROGRESS NOTE    Candice Rangel  CHY:[id number] DOB: 1921/02/11 DOA: 07/02/2018 PCP: Barbie Banner, MD     Brief Narrative:  Candice Rangel is a 85 y.o. female with medical history significant of diastolic dysfunction CHF, COPD, atrial fibrillation, hypertension, chronic kidney disease stage III who was brought in by family with progressive shortness of breath increasing lower extremity edema and cough.  Patient is on diuretics.  Daughter is with patient and she has been giving her her diuretics.  She has been also taking her other medications.  She started having progressive shortness of breath in the last week.  She saw her PCP yesterday who evaluated her and told the daughter to bring her to the ED today.  She was having some white sputum with the shortness of breath.  Her shortness of breath is worse with exertion but also when she lays down flat.  She has been on 2 L of oxygen at home.  She has progressive edema orthopnea and PND.  In the ER patient was found to be significantly hypercarbic and required BiPAP.  New events last 24 hours / Subjective: Patient has been taken off BiPAP, currently on 4 L of nasal cannula oxygen.  Patient is quite surprised that she is not able to discharge home today.  States that her breathing has improved.  Assessment & Plan:   Principal Problem:   Acute on chronic diastolic CHF (congestive heart failure) (HCC) Active Problems:   Essential hypertension   Atrial fibrillation (HCC)   CKD (chronic kidney disease), stage III (HCC)   Interstitial lung disease (HCC)   COPD (chronic obstructive pulmonary disease) (HCC)   Pressure injury of skin   Acute on chronic diastolic CHF exacerbation  -BNP 366 -Chest x-ray reviewed independently, bilateral moderate-sized pleural effusion noted -Started on IV Lasix 80 mg twice daily -Strict I/Os, daily weight  -May require thoracentesis, repeat chest x-ray tomorrow morning -Echocardiogram pending, previous  echocardiogram in 2015 revealed EF 60%, grade 3 diastolic dysfunction  Acute on chronic hypoxemic and hypercarbic respiratory failure  -Initially required BiPAP on admission.  Currently on 4 L nasal cannula O2, she uses 3 L at baseline   COPD/ILD -Without acute exacerbation.  She is chronically steroid-dependent, continue home dose 20mg  daily  Chronic atrial fibrillation -Continue Coumadin, cardizem   Chronic kidney disease stage III -Baseline creatinine 1.2 -Stable, monitor while on IV lasix    DVT prophylaxis: Warfarin Code Status: Full code Family Communication: No family at bedside, called both daughters without answer Disposition Plan: Pending further improvement in respiratory status   Consultants:   None  Procedures:   None  Antimicrobials:  Anti-infectives (From admission, onward)   None       Objective: Vitals:   07/03/18 0237 07/03/18 0342 07/03/18 0813 07/03/18 0819  BP:  (!) 146/79  (!) 156/72  Pulse:  68 75 70  Resp:  (!) 29 (!) 22 (!) 30  Temp:  98.3 F (36.8 C)  98.3 F (36.8 C)  TempSrc:  Oral  Oral  SpO2: 100% 95% 95% 98%  Weight:      Height:        Intake/Output Summary (Last 24 hours) at 07/03/2018 1257 Last data filed at 07/03/2018 0900 Gross per 24 hour  Intake 3 ml  Output 300 ml  Net -297 ml   Filed Weights   07/02/18 2259  Weight: 53.1 kg    Examination:  General exam: Appears calm and comfortable  Respiratory system: Diminished breath sounds  bilateral bases, fine crackles throughout Cardiovascular system: S1 & S2 heard, irregular rhythm, rate in the 70s. No JVD, murmurs, rubs, gallops or clicks. +1 edema bilateral lower extremities Gastrointestinal system: Abdomen is nondistended, soft and nontender. No organomegaly or masses felt. Normal bowel sounds heard. Central nervous system: Alert. No focal neurological deficits. Extremities: Symmetric 5 x 5 power. Skin: No rashes, lesions or ulcers Psychiatry: Judgement and  insight appear normal. Mood & affect appropriate.   Data Reviewed: I have personally reviewed following labs and imaging studies  CBC: Recent Labs  Lab 07/02/18 1751 07/02/18 1901 07/03/18 0257  WBC 8.9  --  9.5  NEUTROABS 7.7  --  7.8*  HGB 11.5* 13.3 11.6*  HCT 39.9 39.0 39.1  MCV 107.3*  --  105.1*  PLT 159  --  135*   Basic Metabolic Panel: Recent Labs  Lab 07/02/18 1751 07/02/18 1901 07/03/18 0257  NA 140 140 143  K 3.8 3.8 3.9  CL 90*  --  93*  CO2 38*  --  45*  GLUCOSE 253*  --  108*  BUN 33*  --  32*  CREATININE 1.36*  --  1.30*  CALCIUM 9.0  --  9.1   GFR: Estimated Creatinine Clearance: 21.4 mL/min (A) (by C-G formula based on SCr of 1.3 mg/dL (H)). Liver Function Tests: No results for input(s): AST, ALT, ALKPHOS, BILITOT, PROT, ALBUMIN in the last 168 hours. No results for input(s): LIPASE, AMYLASE in the last 168 hours. No results for input(s): AMMONIA in the last 168 hours. Coagulation Profile: Recent Labs  Lab 07/02/18 1751 07/03/18 0257  INR 1.32 1.29   Cardiac Enzymes: No results for input(s): CKTOTAL, CKMB, CKMBINDEX, TROPONINI in the last 168 hours. BNP (last 3 results) No results for input(s): PROBNP in the last 8760 hours. HbA1C: No results for input(s): HGBA1C in the last 72 hours. CBG: No results for input(s): GLUCAP in the last 168 hours. Lipid Profile: No results for input(s): CHOL, HDL, LDLCALC, TRIG, CHOLHDL, LDLDIRECT in the last 72 hours. Thyroid Function Tests: No results for input(s): TSH, T4TOTAL, FREET4, T3FREE, THYROIDAB in the last 72 hours. Anemia Panel: No results for input(s): VITAMINB12, FOLATE, FERRITIN, TIBC, IRON, RETICCTPCT in the last 72 hours. Sepsis Labs: No results for input(s): PROCALCITON, LATICACIDVEN in the last 168 hours.  No results found for this or any previous visit (from the past 240 hour(s)).     Radiology Studies: Dg Chest 2 View  Result Date: 07/02/2018 CLINICAL DATA:  Shortness of breath 1  week worse today. Productive cough and diarrhea. Oxygen dependent. EXAM: CHEST - 2 VIEW COMPARISON:  06/15/2017 FINDINGS: Examination demonstrates a moderate size left pleural effusion which is worse likely with associated basilar atelectasis. There is a moderate size right pleural effusion which is worse likely with associated basilar atelectasis. Mild hazy prominence of the perihilar markings. Mild stable cardiomegaly. Remainder of the exam is unchanged to include a moderate compression fracture over the lumbar spine second moderate compression fracture over the lumbar spine new since the previous exam. IMPRESSION: Moderate size bilateral pleural effusions likely with associated basilar atelectasis worse compared to the prior exam. Infection in the lung bases is possible. Mild cardiomegaly. Mild hazy prominence of the perihilar markings which may be due to vascular congestion. Stable moderate compression fracture over the lumbar spine with second adjacent moderate compression fracture of the lumbar spine new since the previous exam but age indeterminate. Electronically Signed   By: Elberta Fortis M.D.   On:  07/02/2018 19:09      Scheduled Meds: . diltiazem  120 mg Oral Daily  . fluticasone furoate-vilanterol  1 puff Inhalation Daily  . furosemide  80 mg Intravenous BID  . ipratropium-albuterol  3 mL Nebulization Q6H  . multivitamin with minerals  2 tablet Oral Daily  . predniSONE  20 mg Oral Q breakfast  . sodium chloride flush  3 mL Intravenous Q12H  . warfarin  2 mg Oral ONCE-1800  . Warfarin - Pharmacist Dosing Inpatient   Does not apply q1800   Continuous Infusions: . sodium chloride       LOS: 1 day    Time spent: 40 minutes   Noralee StainJennifer Gregrey Bloyd, DO [facility] [email or web address] 07/03/2018, 12:57 PM

## 2018-07-04 ENCOUNTER — Inpatient Hospital Stay (HOSPITAL_COMMUNITY): Payer: Medicare Other

## 2018-07-04 DIAGNOSIS — I5033 Acute on chronic diastolic (congestive) heart failure: Secondary | ICD-10-CM

## 2018-07-04 LAB — BASIC METABOLIC PANEL
Anion gap: 11 (ref 5–15)
BUN: 33 mg/dL — ABNORMAL HIGH (ref 8–23)
CO2: 43 mmol/L — ABNORMAL HIGH (ref 22–32)
Calcium: 8.8 mg/dL — ABNORMAL LOW (ref 8.9–10.3)
Chloride: 88 mmol/L — ABNORMAL LOW (ref 98–111)
Creatinine, Ser: 1.23 mg/dL — ABNORMAL HIGH (ref 0.44–1.00)
GFR calc Af Amer: 43 mL/min — ABNORMAL LOW (ref >60)
GFR calc non Af Amer: 37 mL/min — ABNORMAL LOW (ref >60)
Glucose, Bld: 84 mg/dL (ref 70–99)
Potassium: 4.1 mmol/L (ref 3.5–5.1)
Sodium: 142 mmol/L (ref 135–145)

## 2018-07-04 LAB — PROTIME-INR
INR: 1.38
PROTHROMBIN TIME: 16.8 s — AB (ref 11.4–15.2)

## 2018-07-04 LAB — MRSA PCR SCREENING: MRSA by PCR: NEGATIVE

## 2018-07-04 MED ORDER — IPRATROPIUM-ALBUTEROL 0.5-2.5 (3) MG/3ML IN SOLN
3.0000 mL | Freq: Three times a day (TID) | RESPIRATORY_TRACT | Status: DC
  Administered 2018-07-04 – 2018-07-05 (×4): 3 mL via RESPIRATORY_TRACT
  Filled 2018-07-04 (×4): qty 3

## 2018-07-04 MED ORDER — WARFARIN SODIUM 2.5 MG PO TABS
2.5000 mg | ORAL_TABLET | Freq: Once | ORAL | Status: AC
  Administered 2018-07-04: 2.5 mg via ORAL
  Filled 2018-07-04: qty 1

## 2018-07-04 NOTE — Evaluation (Signed)
Occupational Therapy Evaluation Patient Details Name: Candice Rangel MRN: [id number] DOB: 19-May-1920 Today's Date: 07/04/2018    History of Present Illness Candice Rangel is a 85 y.o. female with medical history significant of diastolic dysfunction CHF, COPD, atrial fibrillation, hypertension, chronic kidney disease stage III who was brought in by family with progressive shortness of breath increasing lower extremity edema and cough.  Patient  was found to be significantly hypercarbic and required BiPAP in the ED.   Clinical Impression   Pt admitted with above diagnoses, with generalized weakness, decreased activity tolerance, and cardiopulmonary status limiting ability to complete BADL at desired level of ind. PTA pt wearing O2 and receiving intermittent A from dtr who lives close. At initial encounter of removing purewick, pt soiled with BM and unaware of incontinence (anticipate incontinence is baseline). Pt then incontinent of urine after purewick removal, shares she usually wears depends. Completed peri care standing, pt able to complete at min guard level with set up for supplies.  Pt completing standing grooming task at min guard A level, needing to rest elbows on sink for support. Overall pt shows motivation to be independent throughout activities. At this time recommend pt receive HHOT at d/c to help maximize safety and ind in BADL/IADL in the home. Will continue to follow acutely and assess for equipment needs prior to d/c.    Follow Up Recommendations  Home health OT    Equipment Recommendations  Other (comment)(Cont to assess, possible BSC)    Recommendations for Other Services       Precautions / Restrictions Precautions Precautions: Fall Precaution Comments: oxygen dependent Restrictions Weight Bearing Restrictions: No      Mobility Bed Mobility               General bed mobility comments: up in chair on arrival  Transfers Overall transfer level: Needs  assistance Equipment used: Rolling walker (2 wheeled) Transfers: Sit to/from Stand Sit to Stand: Min guard         General transfer comment: close for safety    Balance Overall balance assessment: Needs assistance   Sitting balance-Leahy Scale: Good     Standing balance support: During functional activity;Single extremity supported Standing balance-Leahy Scale: Fair Standing balance comment: needed one hand on RW during hygiene, able to be unsupported for brief moments                           ADL either performed or assessed with clinical judgement   ADL Overall ADL's : Needs assistance/impaired Eating/Feeding: Set up;Sitting   Grooming: Min guard;Standing;Oral care   Upper Body Bathing: Minimal assistance;Sitting   Lower Body Bathing: Sit to/from stand;Sitting/lateral leans;Moderate assistance   Upper Body Dressing : Set up;Sitting Upper Body Dressing Details (indicate cue type and reason): to don gown Lower Body Dressing: Sit to/from stand;Moderate assistance Lower Body Dressing Details (indicate cue type and reason): to don mesh panties, able to pull down/up half way Toilet Transfer: Min Administrator Details (indicate cue type and reason): simulated with recliner Toileting- Clothing Manipulation and Hygiene: Min guard;Set up;Sit to/from stand Toileting - Clothing Manipulation Details (indicate cue type and reason): stood to clean peri area, able to do so with supply set up and close min guard for safety- needs one hand steady on RW Tub/ Shower Transfer: Min guard;Shower seat;Rolling walker   Functional mobility during ADLs: Min guard;Rolling walker General ADL Comments: pt needing mod A for LB ADLs this date, decreased awareness  to incontinence (expect this to be baseline)     Vision Patient Visual Report: No change from baseline Vision Assessment?: No apparent visual deficits     Perception     Praxis      Pertinent Vitals/Pain Pain  Assessment: No/denies pain     Hand Dominance     Extremity/Trunk Assessment Upper Extremity Assessment Upper Extremity Assessment: Generalized weakness   Lower Extremity Assessment Lower Extremity Assessment: Generalized weakness       Communication Communication Communication: HOH   Cognition Arousal/Alertness: Awake/alert Behavior During Therapy: WFL for tasks assessed/performed Overall Cognitive Status: No family/caregiver present to determine baseline cognitive functioning                                 General Comments: slow processing, decrased awareness of incontinence/safety    General Comments  noted sacral wound    Exercises     Shoulder Instructions      Home Living Family/patient expects to be discharged to:: Private residence Living Arrangements: Alone Available Help at Discharge: Family;Available PRN/intermittently(dtr lives next door and helps throughout day) Type of Home: House Home Access: Stairs to enter Entergy CorporationEntrance Stairs-Number of Steps: 2   Home Layout: One level     Bathroom Shower/Tub: Producer, television/film/videoWalk-in shower   Bathroom Toilet: Standard     Home Equipment: Environmental consultantWalker - 2 wheels;Grab bars - tub/shower;Shower seat          Prior Functioning/Environment Level of Independence: Needs assistance  Gait / Transfers Assistance Needed: uses RW ADL's / Homemaking Assistance Needed: dtr helps with showers, pt wears depends and is incontinent            OT Problem List: Decreased strength;Decreased activity tolerance;Decreased knowledge of use of DME or AE;Cardiopulmonary status limiting activity;Impaired balance (sitting and/or standing);Decreased safety awareness      OT Treatment/Interventions: Self-care/ADL training;DME and/or AE instruction;Therapeutic activities;Balance training;Therapeutic exercise;Energy conservation;Patient/family education    OT Goals(Current goals can be found in the care plan section) Acute Rehab OT  Goals Patient Stated Goal: to go home OT Goal Formulation: With patient Time For Goal Achievement: 07/18/18 Potential to Achieve Goals: Good  OT Frequency: Min 2X/week   Barriers to D/C:            Co-evaluation              AM-PAC OT "6 Clicks" Daily Activity     Outcome Measure Help from another person eating meals?: None Help from another person taking care of personal grooming?: A Little Help from another person toileting, which includes using toliet, bedpan, or urinal?: A Little Help from another person bathing (including washing, rinsing, drying)?: A Little Help from another person to put on and taking off regular upper body clothing?: None Help from another person to put on and taking off regular lower body clothing?: A Little 6 Click Score: 20   End of Session Equipment Utilized During Treatment: Gait belt;Rolling walker;Oxygen Nurse Communication: Mobility status(purewick needing replaced and of incontinence episode)  Activity Tolerance: Patient tolerated treatment well Patient left: in chair;with call bell/phone within reach;with chair alarm set  OT Visit Diagnosis: Other abnormalities of gait and mobility (R26.89);Muscle weakness (generalized) (M62.81)                Time: [id number] OT Time Calculation (min): 27 min Charges:  OT General Charges $OT Visit: 1 Visit OT Evaluation $OT Eval Moderate Complexity: 1 Mod OT Treatments $Self Care/Home  Management : 8-22 mins  Dalphine Handing, MSOT, OTR/L Behavioral Health OT/ Acute Relief OT [facility] Office: [phone]  Dalphine Handing 07/04/2018, 5:54 PM

## 2018-07-04 NOTE — Care Management Note (Addendum)
Case Management Note  Patient Details  Name: Candice Rangel MRN: [id number] Date of Birth: Mar 05, 1921  Subjective/Objective:  From home alone, daughter lives next door, patient has home oxygen 3 liters, NCM offered choice to patient she chose [facility] for HHPT/HHOT, referral given to [name] with [facility]. She will check to make sure they can take referral.   [name] states they are able to take referral.  [name] with [facility] states that they are not in network with patient insurance so they can no take her.   NCM contacted daughter Maylasia [phone] home,  Cell is [phone], and she states to try everyone on the list.  NCM contacted [name] with [facility], he will call me back. [name] states he can take patient for  HHPT and HHOT.              Action/Plan: NCM will follow for transition of care needs.   Expected Discharge Date:  07/05/18               Expected Discharge Plan:  Home w Home Health Services  In-House Referral:     Discharge planning Services  CM Consult  Post Acute Care Choice:  Home Health Choice offered to:  Patient  DME Arranged:    DME Agency:     HH Arranged:  PT, OT HH Agency:  [facility]  Status of Service:  Completed, signed off  If discussed at Long Length of Stay Meetings, dates discussed:    Additional Comments:  Leone Haven, RN 07/04/2018, 2:58 PM

## 2018-07-04 NOTE — Evaluation (Signed)
Physical Therapy Evaluation Patient Details Name: Candice Rangel MRN: [id number] DOB: 1921-02-09 Today's Date: 07/04/2018   History of Present Illness  Candice Rangel is a 85 y.o. female with medical history significant of diastolic dysfunction CHF, COPD, atrial fibrillation, hypertension, chronic kidney disease stage III who was brought in by family with progressive shortness of breath increasing lower extremity edema and cough.  Patient  was found to be significantly hypercarbic and required BiPAP in the ED.  Clinical Impression  Patient presents with decreased mobility due to general weakness, incontinence (likely baseline) and decreased safety awareness.  Has had intermittent help from her daughter prior to admission, but hopefully daughter can provide increased assist temporarily until pt improves from acute illness.  PT to follow acutely and recommend HHPT for home safety and to progress endurance, general strength for reduced fall risk.    Follow Up Recommendations Home health PT;Supervision for mobility/OOB    Equipment Recommendations  None recommended by PT    Recommendations for Other Services       Precautions / Restrictions Precautions Precautions: Fall Precaution Comments: oxygen dependent      Mobility  Bed Mobility Overal bed mobility: Modified Independent             General bed mobility comments: with elevated HOB  Transfers Overall transfer level: Needs assistance Equipment used: Rolling walker (2 wheeled) Transfers: Sit to/from Stand Sit to Stand: Min guard         General transfer comment: for safety with lines and due to soiled brief and pad  Ambulation/Gait Ambulation/Gait assistance: Min guard Gait Distance (Feet): 70 Feet Assistive device: Rolling walker (2 wheeled) Gait Pattern/deviations: Step-through pattern;Decreased stride length;Shuffle;Trunk flexed     General Gait Details: on 3L O2 per pt and difficulty getting SpO2 on her finger,  in room read 85% but HR not correlating with telemetry monitor, back to 94% with correlating HR in <2 minutes; dyspnea 2-3/4  Stairs            Wheelchair Mobility    Modified Rankin (Stroke Patients Only)       Balance Overall balance assessment: Needs assistance   Sitting balance-Leahy Scale: Good       Standing balance-Leahy Scale: Fair Standing balance comment: stood unaided momentarily while helping with hygiene, holding walker some of the time, but pulls up brief on her own with SBA                             Pertinent Vitals/Pain Pain Assessment: No/denies pain    Home Living Family/patient expects to be discharged to:: Private residence Living Arrangements: Alone Available Help at Discharge: Family;Available PRN/intermittently(daughter lives next door and helps in morning and evening) Type of Home: House Home Access: Stairs to enter   [employer] of Steps: 2 Home Layout: One level Home Equipment: Walker - 2 wheels;Grab bars - tub/shower;Shower seat      Prior Function Level of Independence: Needs assistance   Gait / Transfers Assistance Needed: walks with walker on her own  ADL's / Homemaking Assistance Needed: daughter assists with shower, pt wears depends        Hand Dominance        Extremity/Trunk Assessment   Upper Extremity Assessment Upper Extremity Assessment: Generalized weakness    Lower Extremity Assessment Lower Extremity Assessment: Generalized weakness    Cervical / Trunk Assessment Cervical / Trunk Assessment: Kyphotic  Communication   Communication: HOH  Cognition Arousal/Alertness:  Awake/alert Behavior During Therapy: WFL for tasks assessed/performed Overall Cognitive Status: No family/caregiver present to determine baseline cognitive functioning                                 General Comments: slow processing, not aware how soiled she was (recieving Lasix, but also had BM)       General Comments General comments (skin integrity, edema, etc.): RN and NT in to assist after ambulation due to soiled again and pt needed purewick replaced    Exercises     Assessment/Plan    PT Assessment Patient needs continued PT services  PT Problem List Decreased strength;Cardiopulmonary status limiting activity;Decreased activity tolerance;Decreased mobility       PT Treatment Interventions DME instruction;Therapeutic exercise;Gait training;Functional mobility training;Therapeutic activities;Stair training;Patient/family education    PT Goals (Current goals can be found in the Care Plan section)  Acute Rehab PT Goals Patient Stated Goal: none stated, but eager to go home PT Goal Formulation: With patient Time For Goal Achievement: 07/11/18 Potential to Achieve Goals: Fair    Frequency Min 3X/week   Barriers to discharge        Co-evaluation               AM-PAC PT "6 Clicks" Mobility  Outcome Measure Help needed turning from your back to your side while in a flat bed without using bedrails?: A Little Help needed moving from lying on your back to sitting on the side of a flat bed without using bedrails?: None Help needed moving to and from a bed to a chair (including a wheelchair)?: A Little Help needed standing up from a chair using your arms (e.g., wheelchair or bedside chair)?: A Little Help needed to walk in hospital room?: A Little Help needed climbing 3-5 steps with a railing? : A Little 6 Click Score: 19    End of Session Equipment Utilized During Treatment: Oxygen Activity Tolerance: Patient limited by fatigue Patient left: in chair;with nursing/sitter in room   PT Visit Diagnosis: Muscle weakness (generalized) (M62.81)    Time: 2297-9892 PT Time Calculation (min) (ACUTE ONLY): 34 min   Charges:   PT Evaluation $PT Eval Moderate Complexity: 1 Mod PT Treatments $Gait Training: 8-22 mins        Sheran Lawless, PT Acute Rehabilitation  Services [phone] 07/04/2018   Elray Mcgregor 07/04/2018, 11:00 AM

## 2018-07-04 NOTE — Progress Notes (Signed)
PROGRESS NOTE    Candice DickYolanda Termine  ZOX:[id number] DOB: 1920-07-08 DOA: 07/02/2018 PCP: Barbie BannerWilson, Fred H, MD     Brief Narrative:  Candice Rangel is a 85 y.o. female with medical history significant of diastolic dysfunction CHF, COPD, atrial fibrillation, hypertension, chronic kidney disease stage III who was brought in by family with progressive shortness of breath increasing lower extremity edema and cough.  Patient is on diuretics.  Daughter is with patient and she has been giving her her diuretics.  She has been also taking her other medications.  She started having progressive shortness of breath in the last week.  She saw her PCP yesterday who evaluated her and told the daughter to bring her to the ED today.  She was having some white sputum with the shortness of breath.  Her shortness of breath is worse with exertion but also when she lays down flat.  She has been on 2 L of oxygen at home.  She has progressive edema orthopnea and PND.  In the ER patient was found to be significantly hypercarbic and required BiPAP.  New events last 24 hours / Subjective: States that her breathing is about the same.  Discussed with daughter, apparently there is issue with their electricity and patient will not have access to electricity at home today.  PT OT evaluation pending, likely will need home health services.  Patient does live alone and daughter lives next door.  Assessment & Plan:   Principal Problem:   Acute on chronic diastolic CHF (congestive heart failure) (HCC) Active Problems:   Essential hypertension   Atrial fibrillation (HCC)   CKD (chronic kidney disease), stage III (HCC)   Interstitial lung disease (HCC)   COPD (chronic obstructive pulmonary disease) (HCC)   Pressure injury of skin   Acute on chronic diastolic CHF exacerbation  -BNP 366 -Chest x-ray reviewed independently, bilateral moderate-sized pleural effusion noted -Started on IV Lasix 80 mg twice daily -Strict I/Os, daily  weight  -Repeat chest x-ray reviewed independently, revealed stable bilateral pleural effusion left greater than right.  Due to patient's age, Coumadin use, and improved oxygenation status, will not pursue thoracentesis at this time  Acute on chronic hypoxemic and hypercarbic respiratory failure  -Initially required BiPAP on admission.  Currently on 2 L nasal cannula O2, she uses 3 L at baseline   COPD/ILD -Without acute exacerbation.  She is chronically steroid-dependent, continue home dose 20mg  daily  Chronic atrial fibrillation -Continue Coumadin, cardizem   Chronic kidney disease stage III -Baseline creatinine 1.2 -Stable, monitor while on IV lasix    DVT prophylaxis: Warfarin Code Status: Full code Family Communication: No family at bedside, spoke with daughter over the phone Disposition Plan: Likely discharge home 2/18 with home health services   Consultants:   None  Procedures:   None  Antimicrobials:  Anti-infectives (From admission, onward)   None       Objective: Vitals:   07/03/18 2340 07/04/18 0300 07/04/18 0800 07/04/18 0829  BP: 131/73 135/73  140/62  Pulse: 80 79 83 87  Resp: (!) 29 20 (!) 23 18  Temp: 98.3 F (36.8 C) 97.9 F (36.6 C)  98.6 F (37 C)  TempSrc: Oral Oral    SpO2: 97% 98% 100% 91%  Weight:  51.6 kg    Height:        Intake/Output Summary (Last 24 hours) at 07/04/2018 1204 Last data filed at 07/04/2018 0648 Gross per 24 hour  Intake 980 ml  Output 1600 ml  Net -  620 ml   Filed Weights   07/02/18 2259 07/04/18 0300  Weight: 53.1 kg 51.6 kg    Examination: General exam: Appears calm and comfortable  Respiratory system: Diminished breath sounds bilaterally, no respiratory distress on examination Cardiovascular system: S1 & S2 heard, irregular rhythm rate 70s. No JVD, murmurs, rubs, gallops or clicks. Gastrointestinal system: Abdomen is nondistended, soft and nontender. No organomegaly or masses felt. Normal bowel sounds  heard. Central nervous system: Alert and oriented. No focal neurological deficits. Extremities: Symmetric 5 x 5 power. Psychiatry: Judgement and insight appear normal. Mood & affect appropriate.    Data Reviewed: I have personally reviewed following labs and imaging studies  CBC: Recent Labs  Lab 07/02/18 1751 07/02/18 1901 07/03/18 0257  WBC 8.9  --  9.5  NEUTROABS 7.7  --  7.8*  HGB 11.5* 13.3 11.6*  HCT 39.9 39.0 39.1  MCV 107.3*  --  105.1*  PLT 159  --  135*   Basic Metabolic Panel: Recent Labs  Lab 07/02/18 1751 07/02/18 1901 07/03/18 0257 07/04/18 0340  NA 140 140 143 142  K 3.8 3.8 3.9 4.1  CL 90*  --  93* 88*  CO2 38*  --  45* 43*  GLUCOSE 253*  --  108* 84  BUN 33*  --  32* 33*  CREATININE 1.36*  --  1.30* 1.23*  CALCIUM 9.0  --  9.1 8.8*   GFR: Estimated Creatinine Clearance: 22.3 mL/min (A) (by C-G formula based on SCr of 1.23 mg/dL (H)). Liver Function Tests: No results for input(s): AST, ALT, ALKPHOS, BILITOT, PROT, ALBUMIN in the last 168 hours. No results for input(s): LIPASE, AMYLASE in the last 168 hours. No results for input(s): AMMONIA in the last 168 hours. Coagulation Profile: Recent Labs  Lab 07/02/18 1751 07/03/18 0257 07/04/18 0340  INR 1.32 1.29 1.38   Cardiac Enzymes: No results for input(s): CKTOTAL, CKMB, CKMBINDEX, TROPONINI in the last 168 hours. BNP (last 3 results) No results for input(s): PROBNP in the last 8760 hours. HbA1C: No results for input(s): HGBA1C in the last 72 hours. CBG: No results for input(s): GLUCAP in the last 168 hours. Lipid Profile: No results for input(s): CHOL, HDL, LDLCALC, TRIG, CHOLHDL, LDLDIRECT in the last 72 hours. Thyroid Function Tests: No results for input(s): TSH, T4TOTAL, FREET4, T3FREE, THYROIDAB in the last 72 hours. Anemia Panel: No results for input(s): VITAMINB12, FOLATE, FERRITIN, TIBC, IRON, RETICCTPCT in the last 72 hours. Sepsis Labs: No results for input(s): PROCALCITON,  LATICACIDVEN in the last 168 hours.  Recent Results (from the past 240 hour(s))  MRSA PCR Screening     Status: None   Collection Time: 07/04/18  6:53 AM  Result Value Ref Range Status   MRSA by PCR NEGATIVE NEGATIVE Final    Comment:        The GeneXpert MRSA Assay (FDA approved for NASAL specimens only), is one component of a comprehensive MRSA colonization surveillance program. It is not intended to diagnose MRSA infection nor to guide or monitor treatment for MRSA infections. Performed at [facility], [address], [place], [place] [address]        Radiology Studies: Dg Chest 2 View  Result Date: 07/04/2018 CLINICAL DATA:  Shortness of breath, bilateral pleural effusions. EXAM: CHEST - 2 VIEW COMPARISON:  Radiographs of July 02, 2018. FINDINGS: Stable cardiomegaly with central pulmonary vascular congestion. Stable moderate size left pleural effusion is noted with probable underlying atelectasis. Stable right basilar atelectasis or edema  is noted with small pleural effusion. Atherosclerosis of thoracic aorta is noted. No pneumothorax is noted. Bony thorax is unremarkable. IMPRESSION: Stable cardiomegaly with central pulmonary vascular congestion. Stable bilateral pleural effusions, left greater than right. Aortic Atherosclerosis (ICD10-I70.0). Electronically Signed   By: Lupita Raider, M.D.   On: 07/04/2018 08:17   Dg Chest 2 View  Result Date: 07/02/2018 CLINICAL DATA:  Shortness of breath 1 week worse today. Productive cough and diarrhea. Oxygen dependent. EXAM: CHEST - 2 VIEW COMPARISON:  06/15/2017 FINDINGS: Examination demonstrates a moderate size left pleural effusion which is worse likely with associated basilar atelectasis. There is a moderate size right pleural effusion which is worse likely with associated basilar atelectasis. Mild hazy prominence of the perihilar markings. Mild stable cardiomegaly. Remainder of the exam is unchanged to include a moderate  compression fracture over the lumbar spine second moderate compression fracture over the lumbar spine new since the previous exam. IMPRESSION: Moderate size bilateral pleural effusions likely with associated basilar atelectasis worse compared to the prior exam. Infection in the lung bases is possible. Mild cardiomegaly. Mild hazy prominence of the perihilar markings which may be due to vascular congestion. Stable moderate compression fracture over the lumbar spine with second adjacent moderate compression fracture of the lumbar spine new since the previous exam but age indeterminate. Electronically Signed   By: Elberta Fortis M.D.   On: 07/02/2018 19:09      Scheduled Meds: . diltiazem  120 mg Oral Daily  . fluticasone furoate-vilanterol  1 puff Inhalation Daily  . furosemide  80 mg Intravenous BID  . ipratropium-albuterol  3 mL Nebulization TID  . multivitamin with minerals  2 tablet Oral Daily  . predniSONE  20 mg Oral Q breakfast  . sodium chloride flush  3 mL Intravenous Q12H  . Warfarin - Pharmacist Dosing Inpatient   Does not apply q1800   Continuous Infusions: . sodium chloride       LOS: 2 days    Time spent: 30 minutes   Noralee Stain, DO [facility] [email or web address] 07/04/2018, 12:04 PM

## 2018-07-04 NOTE — Progress Notes (Signed)
ANTICOAGULATION CONSULT NOTE  Pharmacy Consult for warfarin Indication: atrial fibrillation   Assessment: 89 yof on warfarin pta for afib presenting with suspected CHF exacerbation. Pharmacy consulted to dose inpatient.  INR subtherapeutic at 1.32 on admit. Dose was not given on 07/02/18 PM.   Received 2 mg warfarin yesterday 07/03/18.  Today the INR is 1.38.  No bleeding reported.    PTA warfarin dose: 1mg  MWF, 0.5mg  all other days (Dose recently increased in outpatient anticoag clinic note on 06/24/18 due to INR of 1.3; last dose 2/15 at 1130 PTA per med rec)  Goal of Therapy:  INR 2-3 Monitor platelets by anticoagulation protocol: Yes   Plan:  Warfarin 2.5 mg PO x1 today  Daily INR Monitor CBC, s/sx bleeding  Thank you for allowing pharmacy to be part of this patients care team. Candice Rangel, RPh Clinical Pharmacist [phone] Please check AMION for all [facility] Pharm[facility] phone numbers After 10:00 PM, call Main Pharmacy [phone] 07/04/2018 12:34 PM

## 2018-07-05 LAB — BASIC METABOLIC PANEL
BUN: 34 mg/dL — ABNORMAL HIGH (ref 8–23)
CO2: >50 mmol/L — ABNORMAL HIGH (ref 22–32)
Calcium: 9.2 mg/dL (ref 8.9–10.3)
Chloride: 87 mmol/L — ABNORMAL LOW (ref 98–111)
Creatinine, Ser: 1.29 mg/dL — ABNORMAL HIGH (ref 0.44–1.00)
GFR calc non Af Amer: 35 mL/min — ABNORMAL LOW (ref >60)
GFR, EST AFRICAN AMERICAN: 41 mL/min — AB (ref >60)
Glucose, Bld: 96 mg/dL (ref 70–99)
Potassium: 4.5 mmol/L (ref 3.5–5.1)
SODIUM: 144 mmol/L (ref 135–145)

## 2018-07-05 LAB — PROTIME-INR
INR: 1.38
Prothrombin Time: 16.8 seconds — ABNORMAL HIGH (ref 11.4–15.2)

## 2018-07-05 MED ORDER — FUROSEMIDE 20 MG PO TABS: 40.0000 mg | ORAL_TABLET | Freq: Two times a day (BID) | ORAL | 0 refills | Status: AC

## 2018-07-05 NOTE — Plan of Care (Signed)
  Problem: Health Behavior/Discharge Planning: Goal: Ability to manage health-related needs will improve Outcome: Progressing   Problem: Clinical Measurements: Goal: Ability to maintain clinical measurements within normal limits will improve Outcome: Progressing   Problem: Clinical Measurements: Goal: Respiratory complications will improve Outcome: Progressing   Problem: Nutrition: Goal: Adequate nutrition will be maintained Outcome: Progressing   Problem: Pain Managment: Goal: General experience of comfort will improve Outcome: Progressing   

## 2018-07-05 NOTE — Care Management Important Message (Signed)
Important Message  Patient Details  Name: Candice Rangel MRN: [id number] Date of Birth: 16-May-1921   Medicare Important Message Given:  Yes    Oralia Rud Kirubel Aja 07/05/2018, 11:58 AM

## 2018-07-05 NOTE — Progress Notes (Signed)
ANTICOAGULATION CONSULT NOTE  Pharmacy Consult for warfarin Indication: atrial fibrillation   Assessment: 89 yof on warfarin pta for afib presenting with suspected CHF exacerbation. Pharmacy consulted to dose inpatient.  INR subtherapeutic at 1.32 on admit.  INR trend since admit date 07/02/18 is 1.32>1.29>1.38>1.38 on today. No bleeding noted.  Hgb 11.6 (2/16) pltc wnl Prior outpatient anticoag visit notes indicate INR has been <2.0 for several visits, at least as far back as 01/26/18, on dosage of 1mg  alterating with 0.5mg  to 1mg  MWF, 0.5mg  all other days.   PTA warfarin dose: 1mg  MWF, 0.5mg  all other days (Dose recently increased in outpatient anticoag clinic note on 06/24/18 due to INR of 1.3; last dose 2/15 at 1130 PTA per med rec)   Goal of Therapy:  INR 2-3 Monitor platelets by anticoagulation protocol: Yes   Plan:  Warfarin  2 mg PO x1 today if patient remains in hospital.   Dr. Alvino Chapel said pt likely to discharge home today.   I recommended discharge dose of 1mg  daily and check INR on Thurs 2/20.  Daily INR Monitor CBC, s/sx bleeding  Thank you for allowing pharmacy to be part of this patients care team. Noah Delaine, RPh Clinical Pharmacist [phone] Please check AMION for all [facility] Pharm[facility] phone numbers After 10:00 PM, call Main Pharmacy [phone] 07/05/2018 9:42 AM

## 2018-07-05 NOTE — Care Management Note (Signed)
Case Management Note  Patient Details  Name: Candice Rangel MRN: [id number] Date of Birth: [birth year]  Subjective/Objective:   From home alone, daughter lives next door, patient has home oxygen 3 liters, NCM offered choice to patient she chose [facility] for HHPT/HHOT, referral given to [name] with [facility]. She will check to make sure they can take referral.   [name] states they are able to take referral.  [name] with [facility] states that they are not in network with patient insurance so they can no take her.   NCM contacted daughter [name] [phone] home,  Cell is [phone], and she states to try everyone on the list.  NCM contacted [name] with [facility], he will call me back. [name] states he can take patient for  HHPT and HHOT.                              Action/Plan: DC home.  Expected Discharge Date:  07/05/18               Expected Discharge Plan:  Home w Home Health Services  In-House Referral:     Discharge planning Services  CM Consult  Post Acute Care Choice:  Home Health Choice offered to:  Patient  DME Arranged:    DME Agency:     HH Arranged:  PT, OT HH Agency:  [facility]  Status of Service:  Completed, signed off  If discussed at Long Length of Stay Meetings, dates discussed:    Additional Comments:  Leone Haven, RN 07/05/2018, 4:29 PM

## 2018-07-05 NOTE — Discharge Instructions (Signed)

## 2018-07-05 NOTE — Discharge Summary (Signed)
Physician Discharge Summary  [name] ZOX:[id number] DOB: [birth year] DOA: 07/02/2018  PCP: Barbie BannerWilson, Fred H, MD  Admit date: 07/02/2018 Discharge date: 07/05/2018  Admitted From: Home Disposition:  Home   Recommendations for Outpatient Follow-up:  1. Follow up with PCP in 1 week for hospital follow up and INR check  2. Consider stopping Coumadin altogether in this 85yo with A fib. INR has not been therapeutic despite adjustments as outpatient.   Home Health: PT OT   Equipment/Devices: Home O2   Discharge Condition: Stable CODE STATUS: Full  Diet recommendation: Heart healthy   Brief/Interim Summary: Candice Valleyis a 85 y.o.femalewith medical history significant ofdiastolic dysfunction CHF, COPD, atrial fibrillation, hypertension, chronic kidney disease stage III who was brought in by family with progressive shortness of breath increasing lower extremity edema and cough. Patient is on diuretics. Daughter is with patient and she has been giving her her diuretics. She has been also taking her other medications. She started having progressive shortness of breath in the last week. She saw her PCP yesterday who evaluated her and told the daughter to bring her to the ED today. She was having some white sputum with the shortness of breath. Her shortness of breath is worse with exertion but also when she lays down flat. She has been on 2 L of oxygen at home. She has progressive edema orthopnea and PND. In the ER patient was found to be significantly hypercarbic and required BiPAP.  Discharge Diagnoses:  Acute on chronic diastolic CHF exacerbation  -BNP 366 -Chest x-ray reviewed independently, bilateral moderate-sized pleural effusion noted -Strict I/Os, daily weight  -Repeat chest x-ray reviewed independently, revealed stable bilateral pleural effusion left greater than right.  Due to patient's age, Coumadin use, and improved oxygenation status, will not pursue thoracentesis at  this time  -Started on IV Lasix 80 mg twice daily --> lasix PO 40mg  BID on discharge   Acute on chronic hypoxemic and hypercarbic respiratory failure  -Initially required BiPAP on admission.  Now using baseline [place] O2 level   COPD/ILD -Without acute exacerbation.  She is chronically steroid-dependent, continue home dose 20mg  daily  Chronic atrial fibrillation -Continue Coumadin, cardizem, digoxin   Chronic kidney disease stage III -Baseline creatinine 1.2 -Stable   Discharge Instructions  Discharge Instructions    (HEART FAILURE PATIENTS) Call MD:  Anytime you have any of the following symptoms: 1) 3 pound weight gain in 24 hours or 5 pounds in 1 week 2) shortness of breath, with or without a dry hacking cough 3) swelling in the hands, feet or stomach 4) if you have to sleep on extra pillows at night in order to breathe.   Complete by:  As directed    Call MD for:  difficulty breathing, headache or visual disturbances   Complete by:  As directed    Call MD for:  extreme fatigue   Complete by:  As directed    Call MD for:  persistant dizziness or light-headedness   Complete by:  As directed    Call MD for:  persistant nausea and vomiting   Complete by:  As directed    Call MD for:  severe uncontrolled pain   Complete by:  As directed    Call MD for:  temperature >100.4   Complete by:  As directed    Diet - low sodium heart healthy   Complete by:  As directed    Discharge instructions   Complete by:  As directed    You were  cared for by a hospitalist during your hospital stay. If you have any questions about your discharge medications or the care you received while you were in the hospital after you are discharged, you can call the unit and ask to speak with the hospitalist on call if the hospitalist that took care of you is not available. Once you are discharged, your primary care physician will handle any further medical issues. Please note that NO REFILLS for any discharge  medications will be authorized once you are discharged, as it is imperative that you return to your primary care physician (or establish a relationship with a primary care physician if you do not have one) for your aftercare needs so that they can reassess your need for medications and monitor your lab values.   Increase activity slowly   Complete by:  As directed      Allergies as of 07/05/2018      Reactions   Cephalexin Other (See Comments)   "Causes an infection"   Other    Pain medicine , unsure of which one, large doses used for gout, made her feel loopy. Okay to take low doses   Penicillins Swelling   Has patient had a PCN reaction causing immediate rash, facial/tongue/throat swelling, SOB or lightheadedness with hypotension: Yes Has patient had a PCN reaction causing severe rash involving mucus membranes or skin necrosis: No Has patient had a PCN reaction that required hospitalization Yes Has patient had a PCN reaction occurring within the last 10 years: No If all of the above answers are "NO", then may proceed with Cephalosporin use.      Medication List    STOP taking these medications   traMADol 50 MG tablet Commonly known as:  ULTRAM     TAKE these medications   albuterol 108 (90 Base) MCG/ACT inhaler Commonly known as:  VENTOLIN HFA Inhale 1-2 puffs into the lungs 4 (four) times daily as needed for wheezing or shortness of breath.   allopurinol 100 MG tablet Commonly known as:  ZYLOPRIM Take 200 mg by mouth daily.   digoxin 0.125 MG tablet Commonly known as:  LANOXIN Take 1 tablet (125 mcg total) by mouth every other day. What changed:  when to take this   diltiazem 120 MG 24 hr capsule Commonly known as:  DILACOR XR Take 1 capsule (120 mg total) by mouth daily.   feeding supplement (ENSURE ENLIVE) Liqd Take 237 mLs by mouth 2 (two) times daily between meals.   fluticasone furoate-vilanterol 200-25 MCG/INH Aepb Commonly known as:  BREO ELLIPTA Inhale 1  puff into the lungs daily as needed (Shortness of Breath).   furosemide 20 MG tablet Commonly known as:  LASIX Take 2 tablets (40 mg total) by mouth 2 (two) times daily. What changed:    how much to take  when to take this   multivitamin tablet Take 2 tablets by mouth daily.   OXYGEN Inhale 2.5 L into the lungs continuous.   predniSONE 10 MG tablet Commonly known as:  DELTASONE Take 20 mg by mouth daily with breakfast.   warfarin 1 MG tablet Commonly known as:  COUMADIN Take 0.5-1 mg by mouth See admin instructions. 1mg  on Mon, Wed, Fri and 0.5mg  all other days      Follow-up Information    [facility] Follow up.   Specialty:  Home Health Services Why:  HHPT,HHOT Contact information: [address] [place] [address] [phone]  Allergies  Allergen Reactions  . Cephalexin Other (See Comments)    "Causes an infection"  . Other     Pain medicine , unsure of which one, large doses used for gout, made her feel loopy.  Okay to take low doses  . Penicillins Swelling    Has patient had a PCN reaction causing immediate rash, facial/tongue/throat swelling, SOB or lightheadedness with hypotension: Yes Has patient had a PCN reaction causing severe rash involving mucus membranes or skin necrosis: No Has patient had a PCN reaction that required hospitalization Yes Has patient had a PCN reaction occurring within the last 10 years: No If all of the above answers are "NO", then may proceed with Cephalosporin use.     Consultations:  None    Procedures/Studies: Dg Chest 2 View  Result Date: 07/04/2018 CLINICAL DATA:  Shortness of breath, bilateral pleural effusions. EXAM: CHEST - 2 VIEW COMPARISON:  Radiographs of July 02, 2018. FINDINGS: Stable cardiomegaly with central pulmonary vascular congestion. Stable moderate size left pleural effusion is noted with probable underlying atelectasis. Stable right basilar atelectasis or  edema is noted with small pleural effusion. Atherosclerosis of thoracic aorta is noted. No pneumothorax is noted. Bony thorax is unremarkable. IMPRESSION: Stable cardiomegaly with central pulmonary vascular congestion. Stable bilateral pleural effusions, left greater than right. Aortic Atherosclerosis (ICD10-I70.0). Electronically Signed   By: Lupita Raider, M.D.   On: 07/04/2018 08:17   Dg Chest 2 View  Result Date: 07/02/2018 CLINICAL DATA:  Shortness of breath 1 week worse today. Productive cough and diarrhea. Oxygen dependent. EXAM: CHEST - 2 VIEW COMPARISON:  06/15/2017 FINDINGS: Examination demonstrates a moderate size left pleural effusion which is worse likely with associated basilar atelectasis. There is a moderate size right pleural effusion which is worse likely with associated basilar atelectasis. Mild hazy prominence of the perihilar markings. Mild stable cardiomegaly. Remainder of the exam is unchanged to include a moderate compression fracture over the lumbar spine second moderate compression fracture over the lumbar spine new since the previous exam. IMPRESSION: Moderate size bilateral pleural effusions likely with associated basilar atelectasis worse compared to the prior exam. Infection in the lung bases is possible. Mild cardiomegaly. Mild hazy prominence of the perihilar markings which may be due to vascular congestion. Stable moderate compression fracture over the lumbar spine with second adjacent moderate compression fracture of the lumbar spine new since the previous exam but age indeterminate. Electronically Signed   By: Elberta Fortis M.D.   On: 07/02/2018 19:09      Discharge Exam: Vitals:   07/04/18 2259 07/05/18 0737  BP: 140/72 126/77  Pulse: 66 70  Resp: (!) 22   Temp: 97.7 F (36.5 C) 97.7 F (36.5 C)  SpO2: 97% 99%    General: Pt is alert, awake, not in acute distress Cardiovascular: Irreg rhythm, S1/S2 +, no rubs, no gallops Respiratory: Diminished breath  sounds bilaterally, on [place] O2 without distress Abdominal: Soft, NT, ND, bowel sounds + Extremities: Trace edema, no cyanosis    The results of significant diagnostics from this hospitalization (including imaging, microbiology, ancillary and laboratory) are listed below for reference.     Microbiology: Recent Results (from the past 240 hour(s))  MRSA PCR Screening     Status: None   Collection Time: 07/04/18  6:53 AM  Result Value Ref Range Status   MRSA by PCR NEGATIVE NEGATIVE Final    Comment:        The GeneXpert MRSA Assay (FDA approved for NASAL specimens  only), is one component of a comprehensive MRSA colonization surveillance program. It is not intended to diagnose MRSA infection nor to guide or monitor treatment for MRSA infections. Performed at [facility], [address], [place], [place] [address]      Labs: BNP (last 3 results) Recent Labs    07/02/18 1751  BNP 366.6*   Basic Metabolic Panel: Recent Labs  Lab 07/02/18 1751 07/02/18 1901 07/03/18 0257 07/04/18 0340 07/05/18 0330  NA 140 140 143 142 144  K 3.8 3.8 3.9 4.1 4.5  CL 90*  --  93* 88* 87*  CO2 38*  --  45* 43* >50*  GLUCOSE 253*  --  108* 84 96  BUN 33*  --  32* 33* 34*  CREATININE 1.36*  --  1.30* 1.23* 1.29*  CALCIUM 9.0  --  9.1 8.8* 9.2   Liver Function Tests: No results for input(s): AST, ALT, ALKPHOS, BILITOT, PROT, ALBUMIN in the last 168 hours. No results for input(s): LIPASE, AMYLASE in the last 168 hours. No results for input(s): AMMONIA in the last 168 hours. CBC: Recent Labs  Lab 07/02/18 1751 07/02/18 1901 07/03/18 0257  WBC 8.9  --  9.5  NEUTROABS 7.7  --  7.8*  HGB 11.5* 13.3 11.6*  HCT 39.9 39.0 39.1  MCV 107.3*  --  105.1*  PLT 159  --  135*   Cardiac Enzymes: No results for input(s): CKTOTAL, CKMB, CKMBINDEX, TROPONINI in the last 168 hours. BNP: Invalid input(s): POCBNP CBG: No results for input(s): GLUCAP in the last 168 hours. D-Dimer No  results for input(s): DDIMER in the last 72 hours. Hgb A1c No results for input(s): HGBA1C in the last 72 hours. Lipid Profile No results for input(s): CHOL, HDL, LDLCALC, TRIG, CHOLHDL, LDLDIRECT in the last 72 hours. Thyroid function studies No results for input(s): TSH, T4TOTAL, T3FREE, THYROIDAB in the last 72 hours.  Invalid input(s): FREET3 Anemia work up No results for input(s): VITAMINB12, FOLATE, FERRITIN, TIBC, IRON, RETICCTPCT in the last 72 hours. Urinalysis    Component Value Date/Time   COLORURINE YELLOW 01/16/2016 0929   APPEARANCEUR CLOUDY (A) 01/16/2016 0929   LABSPEC 1.022 01/16/2016 0929   PHURINE 5.0 01/16/2016 0929   GLUCOSEU NEGATIVE 01/16/2016 0929   HGBUR MODERATE (A) 01/16/2016 0929   BILIRUBINUR NEGATIVE 01/16/2016 0929   KETONESUR NEGATIVE 01/16/2016 0929   PROTEINUR 100 (A) 01/16/2016 0929   UROBILINOGEN 0.2 01/08/2015 1049   NITRITE NEGATIVE 01/16/2016 0929   LEUKOCYTESUR NEGATIVE 01/16/2016 0929   Sepsis Labs Invalid input(s): PROCALCITONIN,  WBC,  LACTICIDVEN Microbiology Recent Results (from the past 240 hour(s))  MRSA PCR Screening     Status: None   Collection Time: 07/04/18  6:53 AM  Result Value Ref Range Status   MRSA by PCR NEGATIVE NEGATIVE Final    Comment:        The GeneXpert MRSA Assay (FDA approved for NASAL specimens only), is one component of a comprehensive MRSA colonization surveillance program. It is not intended to diagnose MRSA infection nor to guide or monitor treatment for MRSA infections. Performed at [facility], [address], [place], [place] [address]      Patient was seen and examined on the day of discharge and was found to be in stable condition. Time coordinating discharge: 25 minutes including assessment and coordination of care, as well as examination of the patient.   SIGNED:  Noralee Stain, DO [facility] [email or web address] 07/05/2018, 9:57 AM

## 2018-08-17 DEATH — deceased

## 2019-03-18 IMAGING — DX DG CHEST 2V
2 series · 2 of 2 positions shown · non-contrast
Comparison: Radiographs July 02, 2018.

CLINICAL DATA: Shortness of breath, bilateral pleural effusions.

EXAM:
CHEST - 2 VIEW

[chest lat]
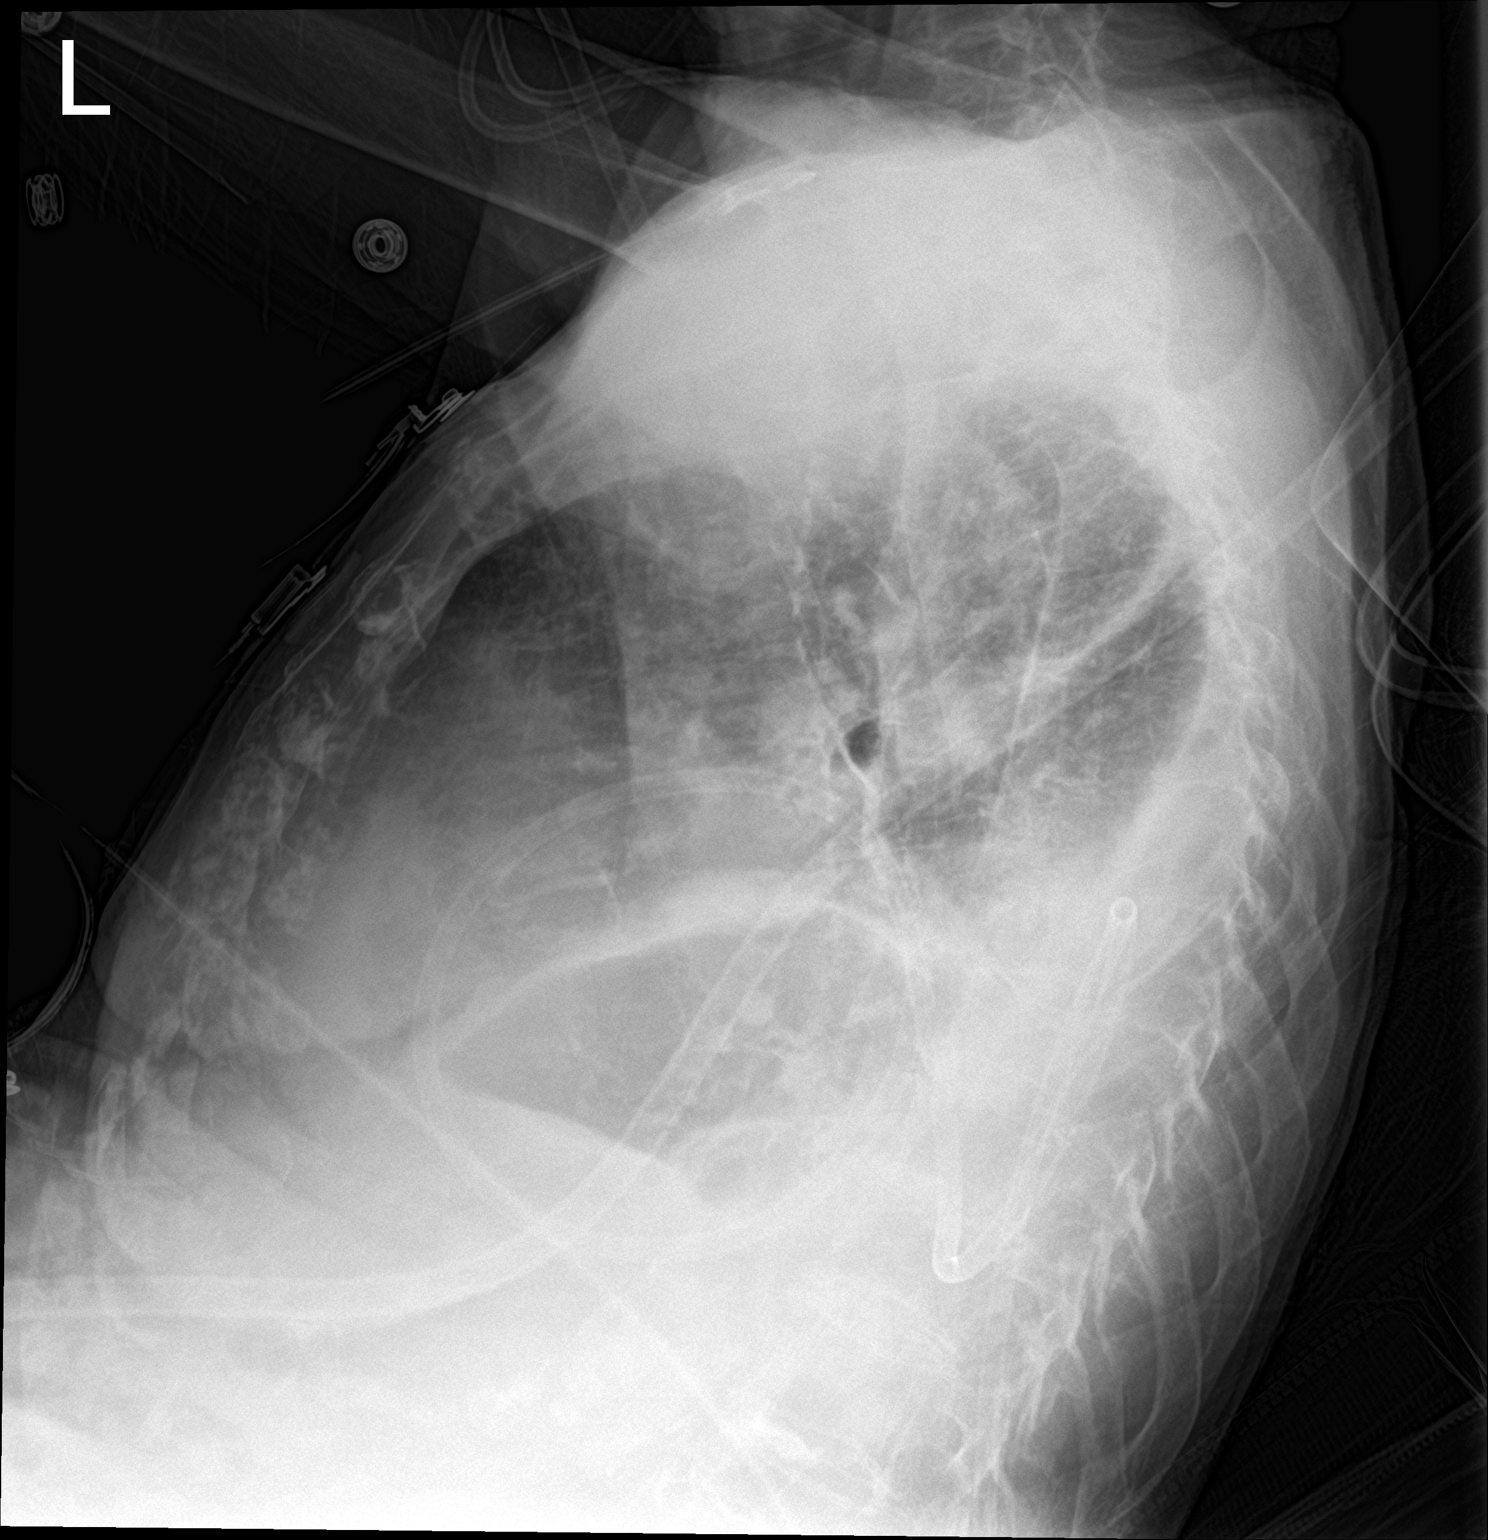

[chest ap]
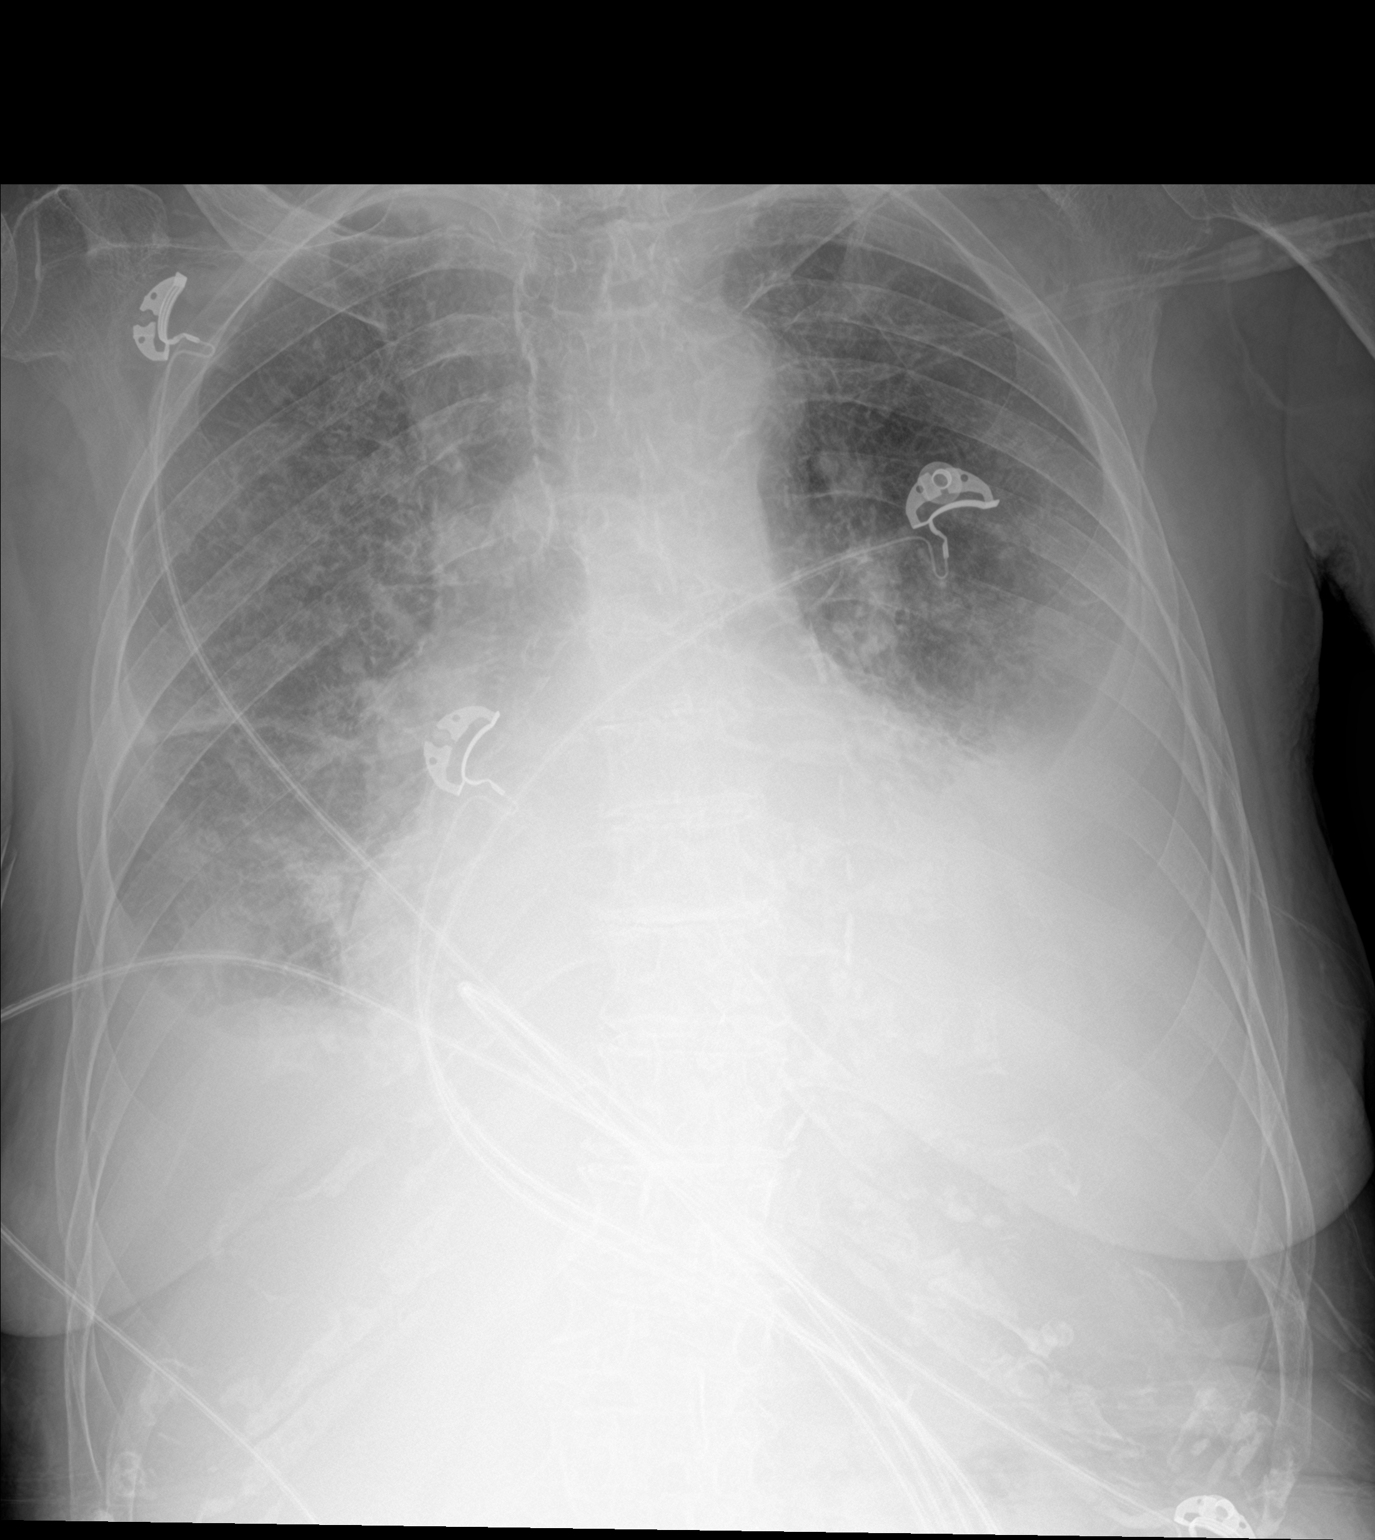

[2 of 2 positions shown; findings below may reference images not displayed]

FINDINGS: Stable cardiomegaly with central pulmonary vascular congestion.
Stable moderate size left pleural effusion is noted with probable
underlying atelectasis. Stable right basilar atelectasis or edema is
noted with small pleural effusion. Atherosclerosis of thoracic aorta
is noted. No pneumothorax is noted. Bony thorax is unremarkable.
IMPRESSION: Stable cardiomegaly with central pulmonary vascular congestion.
Stable bilateral pleural effusions, left greater than right.

Aortic Atherosclerosis ([id number]).
